# Patient Record
Sex: Male | Born: 1960 | Race: Black or African American | Hispanic: No
Health system: Midwestern US, Community
[De-identification: ages and names within clinical notes are randomized; demographics above are authoritative.]

## PROBLEM LIST (undated history)

## (undated) DIAGNOSIS — J069 Acute upper respiratory infection, unspecified: Secondary | ICD-10-CM

## (undated) DIAGNOSIS — R079 Chest pain, unspecified: Principal | ICD-10-CM

---

## 2019-10-27 ENCOUNTER — Inpatient Hospital Stay (HOSPITAL_COMMUNITY)
Admission: EM | Admit: 2019-10-27 | Discharge: 2019-10-31 | DRG: 638 | Disposition: A | Discharge status: Home / Self Care | Payer: 59 | Attending: Internal Medicine | Admitting: Internal Medicine

## 2019-10-27 ENCOUNTER — Emergency Department (HOSPITAL_COMMUNITY): Payer: 59

## 2019-10-27 ENCOUNTER — Encounter (HOSPITAL_COMMUNITY): Payer: Self-pay

## 2019-10-27 DIAGNOSIS — Y929 Unspecified place or not applicable: Secondary | ICD-10-CM

## 2019-10-27 DIAGNOSIS — G8929 Other chronic pain: Secondary | ICD-10-CM | POA: Diagnosis present

## 2019-10-27 DIAGNOSIS — Z794 Long term (current) use of insulin: Secondary | ICD-10-CM

## 2019-10-27 DIAGNOSIS — I2583 Coronary atherosclerosis due to lipid rich plaque: Secondary | ICD-10-CM | POA: Diagnosis not present

## 2019-10-27 DIAGNOSIS — K219 Gastro-esophageal reflux disease without esophagitis: Secondary | ICD-10-CM | POA: Diagnosis present

## 2019-10-27 DIAGNOSIS — F1010 Alcohol abuse, uncomplicated: Secondary | ICD-10-CM

## 2019-10-27 DIAGNOSIS — Y906 Blood alcohol level of 120-199 mg/100 ml: Secondary | ICD-10-CM | POA: Diagnosis present

## 2019-10-27 DIAGNOSIS — E1110 Type 2 diabetes mellitus with ketoacidosis without coma: Secondary | ICD-10-CM | POA: Diagnosis present

## 2019-10-27 DIAGNOSIS — I2510 Atherosclerotic heart disease of native coronary artery without angina pectoris: Secondary | ICD-10-CM | POA: Diagnosis present

## 2019-10-27 DIAGNOSIS — Z79899 Other long term (current) drug therapy: Secondary | ICD-10-CM

## 2019-10-27 DIAGNOSIS — F329 Major depressive disorder, single episode, unspecified: Secondary | ICD-10-CM | POA: Diagnosis present

## 2019-10-27 DIAGNOSIS — F419 Anxiety disorder, unspecified: Secondary | ICD-10-CM | POA: Diagnosis present

## 2019-10-27 DIAGNOSIS — Z20822 Contact with and (suspected) exposure to covid-19: Secondary | ICD-10-CM | POA: Diagnosis present

## 2019-10-27 DIAGNOSIS — T383X6A Underdosing of insulin and oral hypoglycemic [antidiabetic] drugs, initial encounter: Secondary | ICD-10-CM | POA: Diagnosis present

## 2019-10-27 DIAGNOSIS — E871 Hypo-osmolality and hyponatremia: Secondary | ICD-10-CM | POA: Diagnosis present

## 2019-10-27 DIAGNOSIS — E785 Hyperlipidemia, unspecified: Secondary | ICD-10-CM | POA: Diagnosis present

## 2019-10-27 DIAGNOSIS — F1110 Opioid abuse, uncomplicated: Secondary | ICD-10-CM | POA: Diagnosis not present

## 2019-10-27 DIAGNOSIS — I10 Essential (primary) hypertension: Secondary | ICD-10-CM | POA: Diagnosis present

## 2019-10-27 DIAGNOSIS — Z91128 Patient's intentional underdosing of medication regimen for other reason: Secondary | ICD-10-CM | POA: Diagnosis not present

## 2019-10-27 DIAGNOSIS — F1120 Opioid dependence, uncomplicated: Secondary | ICD-10-CM | POA: Diagnosis present

## 2019-10-27 DIAGNOSIS — Z7982 Long term (current) use of aspirin: Secondary | ICD-10-CM

## 2019-10-27 DIAGNOSIS — F10939 Alcohol use, unspecified with withdrawal, unspecified: Secondary | ICD-10-CM

## 2019-10-27 DIAGNOSIS — F10239 Alcohol dependence with withdrawal, unspecified: Secondary | ICD-10-CM | POA: Diagnosis present

## 2019-10-27 DIAGNOSIS — M545 Low back pain: Secondary | ICD-10-CM | POA: Diagnosis present

## 2019-10-27 DIAGNOSIS — M549 Dorsalgia, unspecified: Secondary | ICD-10-CM

## 2019-10-27 LAB — CBG MONITORING, ED
Glucose-Capillary: 458 mg/dL — ABNORMAL HIGH (ref 70–99)
Glucose-Capillary: 390 mg/dL — ABNORMAL HIGH (ref 70–99)
Glucose-Capillary: 229 mg/dL — ABNORMAL HIGH (ref 70–99)
Glucose-Capillary: 252 mg/dL — ABNORMAL HIGH (ref 70–99)

## 2019-10-27 LAB — COMPREHENSIVE METABOLIC PANEL
ALT: 49 U/L — ABNORMAL HIGH (ref 0–44)
AST: 58 U/L — ABNORMAL HIGH (ref 15–41)
Albumin: 3.4 g/dL — ABNORMAL LOW (ref 3.5–5.0)
Alkaline Phosphatase: 154 U/L — ABNORMAL HIGH (ref 38–126)
Anion gap: 17 — ABNORMAL HIGH (ref 5–15)
BUN: 7 mg/dL (ref 6–20)
CO2: 22 mmol/L (ref 22–32)
Calcium: 9.3 mg/dL (ref 8.9–10.3)
Chloride: 88 mmol/L — ABNORMAL LOW (ref 98–111)
Creatinine, Ser: 1.2 mg/dL (ref 0.61–1.24)
GFR calc Af Amer: >60 mL/min (ref >60)
GFR calc non Af Amer: >60 mL/min (ref >60)
Glucose, Bld: 620 mg/dL (ref 70–99)
Potassium: 3.9 mmol/L (ref 3.5–5.1)
Sodium: 127 mmol/L — ABNORMAL LOW (ref 135–145)
Total Bilirubin: 0.4 mg/dL (ref 0.3–1.2)
Total Protein: 7.6 g/dL (ref 6.5–8.1)

## 2019-10-27 LAB — ACETAMINOPHEN LEVEL: Acetaminophen (Tylenol), Serum: <10 ug/mL — ABNORMAL LOW (ref 10–30)

## 2019-10-27 LAB — CBC
HCT: 44.7 % (ref 39.0–52.0)
Hemoglobin: 14.3 g/dL (ref 13.0–17.0)
MCH: 27.6 pg (ref 26.0–34.0)
MCHC: 32 g/dL (ref 30.0–36.0)
MCV: 86.1 fL (ref 80.0–100.0)
Platelets: 305 10*3/uL (ref 150–400)
RBC: 5.19 MIL/uL (ref 4.22–5.81)
RDW: 13.3 % (ref 11.5–15.5)
WBC: 5.9 10*3/uL (ref 4.0–10.5)
nRBC: 0 % (ref 0.0–0.2)

## 2019-10-27 LAB — I-STAT VENOUS BLOOD GAS, ED
Acid-Base Excess: 2 mmol/L (ref 0.0–2.0)
Bicarbonate: 29 mmol/L — ABNORMAL HIGH (ref 20.0–28.0)
Calcium, Ion: 1.11 mmol/L — ABNORMAL LOW (ref 1.15–1.40)
HCT: 52 % (ref 39.0–52.0)
Hemoglobin: 17.7 g/dL — ABNORMAL HIGH (ref 13.0–17.0)
O2 Saturation: 52 %
Potassium: 4.7 mmol/L (ref 3.5–5.1)
Sodium: 128 mmol/L — ABNORMAL LOW (ref 135–145)
TCO2: 31 mmol/L (ref 22–32)
pCO2, Ven: 50.6 mmHg (ref 44.0–60.0)
pH, Ven: 7.366 (ref 7.250–7.430)
pO2, Ven: 29 mmHg — CL (ref 32.0–45.0)

## 2019-10-27 LAB — RAPID URINE DRUG SCREEN, HOSP PERFORMED
Amphetamines: NOT DETECTED
Barbiturates: NOT DETECTED
Benzodiazepines: NOT DETECTED
Cocaine: NOT DETECTED
Opiates: NOT DETECTED
Tetrahydrocannabinol: NOT DETECTED

## 2019-10-27 LAB — BETA-HYDROXYBUTYRIC ACID: Beta-Hydroxybutyric Acid: 0.08 mmol/L (ref 0.05–0.27)

## 2019-10-27 LAB — ETHANOL: Alcohol, Ethyl (B): 175 mg/dL — ABNORMAL HIGH (ref <10)

## 2019-10-27 LAB — SARS CORONAVIRUS 2 BY RT PCR (HOSPITAL ORDER, PERFORMED IN ~~LOC~~ HOSPITAL LAB): SARS Coronavirus 2: NEGATIVE

## 2019-10-27 MED ORDER — LORAZEPAM 2 MG/ML IJ SOLN
0.0000 mg | Freq: Two times a day (BID) | INTRAMUSCULAR | Status: DC
  Administered 2019-10-30 – 2019-10-31 (×3): 2 mg via INTRAVENOUS
  Filled 2019-10-27 (×3): qty 1

## 2019-10-27 MED ORDER — SODIUM CHLORIDE 0.9 % IV BOLUS
1000.0000 mL | Freq: Once | INTRAVENOUS | Status: AC
  Administered 2019-10-27: 1000 mL via INTRAVENOUS

## 2019-10-27 MED ORDER — ENOXAPARIN SODIUM 40 MG/0.4ML ~~LOC~~ SOLN: 40.0000 mg | Freq: Every day | SUBCUTANEOUS | Status: DC

## 2019-10-27 MED ORDER — LIDOCAINE 5 % EX PTCH
1.0000 | MEDICATED_PATCH | Freq: Every day | CUTANEOUS | Status: DC
  Administered 2019-10-27: 1 via TRANSDERMAL
  Filled 2019-10-27: qty 1

## 2019-10-27 MED ORDER — LACTATED RINGERS IV SOLN: INTRAVENOUS | Status: DC

## 2019-10-27 MED ORDER — LORAZEPAM 1 MG PO TABS
0.0000 mg | ORAL_TABLET | Freq: Four times a day (QID) | ORAL | Status: AC
  Administered 2019-10-27 – 2019-10-28 (×4): 1 mg via ORAL
  Filled 2019-10-27 (×5): qty 1

## 2019-10-27 MED ORDER — ACETAMINOPHEN 325 MG PO TABS
650.0000 mg | ORAL_TABLET | Freq: Four times a day (QID) | ORAL | Status: DC | PRN
  Filled 2019-10-27: qty 2

## 2019-10-27 MED ORDER — KETOROLAC TROMETHAMINE 15 MG/ML IJ SOLN
15.0000 mg | Freq: Once | INTRAMUSCULAR | Status: AC
  Administered 2019-10-27: 15 mg via INTRAVENOUS
  Filled 2019-10-27: qty 1

## 2019-10-27 MED ORDER — DEXTROSE IN LACTATED RINGERS 5 % IV SOLN: INTRAVENOUS | Status: DC

## 2019-10-27 MED ORDER — THIAMINE HCL 100 MG/ML IJ SOLN: 100.0000 mg | Freq: Every day | INTRAMUSCULAR | Status: DC

## 2019-10-27 MED ORDER — LORAZEPAM 1 MG PO TABS: 0.0000 mg | ORAL_TABLET | Freq: Two times a day (BID) | ORAL | Status: DC

## 2019-10-27 MED ORDER — DEXTROSE 50 % IV SOLN: 0.0000 mL | INTRAVENOUS | Status: DC | PRN

## 2019-10-27 MED ORDER — ENOXAPARIN SODIUM 40 MG/0.4ML ~~LOC~~ SOLN
40.0000 mg | Freq: Every day | SUBCUTANEOUS | Status: DC
  Administered 2019-10-28 – 2019-10-31 (×4): 40 mg via SUBCUTANEOUS
  Filled 2019-10-27 (×3): qty 0.4

## 2019-10-27 MED ORDER — THIAMINE HCL 100 MG PO TABS
100.0000 mg | ORAL_TABLET | Freq: Every day | ORAL | Status: DC
  Administered 2019-10-27 – 2019-10-31 (×5): 100 mg via ORAL
  Filled 2019-10-27 (×5): qty 1

## 2019-10-27 MED ORDER — INSULIN REGULAR(HUMAN) IN NACL 100-0.9 UT/100ML-% IV SOLN
INTRAVENOUS | Status: DC
  Administered 2019-10-27: 15 [IU]/h via INTRAVENOUS
  Filled 2019-10-27: qty 100

## 2019-10-27 MED ORDER — LORAZEPAM 2 MG/ML IJ SOLN
0.0000 mg | Freq: Four times a day (QID) | INTRAMUSCULAR | Status: AC
  Administered 2019-10-29 (×3): 1 mg via INTRAVENOUS
  Filled 2019-10-27 (×4): qty 1

## 2019-10-27 MED ORDER — POTASSIUM CHLORIDE 10 MEQ/100ML IV SOLN
10.0000 meq | INTRAVENOUS | Status: AC
  Administered 2019-10-27 (×2): 10 meq via INTRAVENOUS
  Filled 2019-10-27: qty 100

## 2019-10-27 NOTE — H&P (Signed)
History and Physical    David Mcbride DJM:[id number] DOB: 12-14-1960 DOA: 10/27/2019  PCP: Patient, No Pcp Per  Patient coming from: Home  I have personally briefly reviewed patient's old medical records in [facility]  Chief Complaint: Wants to detox  HPI: David Mcbride is a 59 y.o. male with medical history significant for alcohol and opioid abuse, insulin-dependent type 2 diabetes, hypertension, CAD, GERD and anxiety and depression who presents with concerns of wanting to detox.  Patient is coming in today because he feels like he has an addiction problem and wants to detox.  States 3 years ago he had issues with lower back pain and since then has been taking more more opioids.  He has been taking 6-7 hydrocodone pills for the past 2 years with the last dose being this morning.  Also reports many years of alcohol use and has increased in the past 2 weeks up to 18-20 beers daily.  Denies any suicidal ideations but knows eventually he might hurt himself so that is why he brought himself in here today.  He also stopped taking all of his home meds and insulin for the past 2 weeks since he just did not feel like it.  He resumed them last night. Right now he just reports feeling shaky.  Denies any nausea, vomiting or diarrhea.  No chest pain, shortness of breath.  ED Course: He had temperature of 99, tachycardic and hypertensive up to 170s.  Lab work notable for hyponatremia of 127, glucose of 620, anion gap 17, pH of 7.3, bicarb of 29.  CBC unremarkable.  Alcohol level elevated to 175.  UDS negative.  Patient was placed on CIWA protocol and insulin drip and hospitalist was called for admission.  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  He is married.  He denies tobacco use.  Has had increased opioid use and 18-20 beers daily.   Prior to Admission medications   Medication Sig Start Date End Date Taking? Authorizing Provider  amLODipine (NORVASC) 10 MG tablet Take 10 mg by mouth daily. 08/03/19   [provider]  aspirin 81 MG EC tablet Take 81 mg by mouth daily.    [provider]  gabapentin (NEURONTIN) 300 MG capsule Take 300 mg by mouth daily. 10/09/19   [provider]  HYDROcodone-acetaminophen (NORCO) 10-325 MG tablet Take 1 tablet by mouth 3 (three) times daily as needed for pain. 10/03/19   [provider]  sertraline (ZOLOFT) 50 MG tablet Take 50 mg by mouth daily. 10/14/19   [provider]    Physical Exam: Vitals:   10/27/19 2130 10/27/19 2145 10/27/19 2200 10/27/19 2215  BP: (!) 158/119 (!) 167/115 (!) 178/123 (!) 173/118  Pulse: 99 (!) 106 99 (!) 102  Resp: 15 20 14 13   Temp:      TempSrc:      SpO2: 96% 100% 96% 96%  Weight:      Height:        Constitutional:  NAD, calm, comfortable, nontoxic appearing male laying flat in bed  Vitals:   10/27/19 2130 10/27/19 2145 10/27/19 2200 10/27/19 2215  BP: (!) 158/119 (!) 167/115 (!) 178/123 (!) 173/118  Pulse: 99 (!) 106 99 (!) 102  Resp: 15 20 14 13   Temp:      TempSrc:      SpO2: 96% 100% 96% 96%  Weight:      Height:       Eyes: PERRL, lids and conjunctivae normal ENMT: Mucous membranes are moist.  Neck: normal, supple  Respiratory: clear to auscultation bilaterally, no wheezing, no crackles. Normal respiratory effort. No accessory muscle use.  Cardiovascular: Regular rate  and rhythm, no murmurs / rubs / gallops. No extremity edema. Abdomen: no tenderness, no masses palpated.Bowel sounds positive.  Musculoskeletal: no clubbing / cyanosis. No joint deformity upper and lower extremities. Good ROM, no contractures. Normal muscle tone.  Skin: no rashes, lesions, ulcers. No induration Neurologic: CN 2-12 grossly intact. Sensation intact,  Strength 5/5 in all 4.  Psychiatric: Normal judgment and insight. Alert and oriented x 3. Normal mood.    Labs on Admission: I have personally reviewed following labs and imaging studies  CBC: Recent Labs  Lab 10/27/19 1404 10/27/19 2013  WBC 5.9  --   HGB 14.3 17.7*  HCT 44.7 52.0  MCV 86.1  --   PLT 305  --    Basic Metabolic Panel: Recent Labs  Lab 10/27/19 1404 10/27/19 2013  NA 127* 128*  K 3.9 4.7  CL 88*  --   CO2 22  --   GLUCOSE 620*  --   BUN 7  --   CREATININE 1.20  --   CALCIUM 9.3  --    GFR: Estimated Creatinine Clearance: 73.4 mL/min (by C-G formula based on SCr of 1.2 mg/dL). Liver Function Tests: Recent Labs  Lab 10/27/19 1404  AST 58*  ALT 49*  ALKPHOS 154*  BILITOT 0.4  PROT 7.6  ALBUMIN 3.4*   No results for input(s): LIPASE, AMYLASE in the last 168 hours. No results for input(s): AMMONIA in the last 168 hours. Coagulation Profile: No results for input(s): INR, PROTIME in the last 168 hours. Cardiac Enzymes: No results for input(s): CKTOTAL, CKMB, CKMBINDEX, TROPONINI in the last 168 hours. BNP (last 3 results) No results for input(s): PROBNP in the last 8760 hours. HbA1C: No results for input(s): HGBA1C in the last 72 hours. CBG: Recent Labs  Lab 10/27/19 2118 10/27/19 2206  GLUCAP 458* 390*   Lipid Profile: No results for input(s): CHOL, HDL, LDLCALC, TRIG, CHOLHDL, LDLDIRECT in the last 72 hours. Thyroid Function Tests: No results for input(s): TSH, T4TOTAL, FREET4, T3FREE, THYROIDAB in the last 72 hours. Anemia Panel: No results for input(s): VITAMINB12, FOLATE,  FERRITIN, TIBC, IRON, RETICCTPCT in the last 72 hours. Urine analysis: No results found for: COLORURINE, APPEARANCEUR, LABSPEC, PHURINE, GLUCOSEU, HGBUR, BILIRUBINUR, KETONESUR, PROTEINUR, UROBILINOGEN, NITRITE, LEUKOCYTESUR  Radiological Exams on Admission: DG Chest 2 View  Result Date: 10/27/2019 CLINICAL DATA:  Chest pain EXAM: CHEST - 2 VIEW COMPARISON:  None. FINDINGS: The heart size and mediastinal contours are within normal limits. Both lungs are clear. The visualized skeletal structures are unremarkable. IMPRESSION: No active cardiopulmonary disease. Electronically Signed   By: 10/29/2019 M.D.   On: 10/27/2019 20:52      Assessment/Plan  Mild DKA pH of 7.3, bicarb 29, BG of 620, anion gap 17 replete potassium continue insulin gtt with goal of 140-180 and AG <12 IV  NS until BG <250, then switch to D5 1/2 NS  BMP q4hr  keep NPO  Alcohol abuse place on CIWA protocol Endorsed 18-20 beers daily.  Last had 3 beers this morning on 8/31. Transition of care team consulted to help with inpatient detox resources  Opioid abuse for chronic back pain Endorsed use of 6-7 hydrocodone's daily for the past several years States back pain about 10/10 now give Torodol x1  Lidocaine patch  will cut Norco frequency by half to avoid withdrawal  TOCM consulted   Pseudohyponatremia osmotic changes due to DKA Normal after glucose correction   Hypertension  Continue amlodipine  CAD Continue daily aspirin  DVT prophylaxis:.Lovenox Code Status: Full Family Communication: Plan discussed with patient at bedside  disposition Plan: Home with at least 2 midnight stays  Consults called:  Admission status: inpatient  Status is: Inpatient  Remains inpatient appropriate because:Inpatient level of care appropriate due to severity of illness   Dispo: The patient is from: Home              Anticipated d/c is to: Home              Anticipated d/c date is: 3 days              Patient  currently is not medically stable to d/c.         Anselm Jungling DO [facility]   If 7PM-7AM, please contact night-coverage [email or web address]   10/27/2019, 10:32 PM

## 2019-10-27 NOTE — ED Notes (Signed)
Dr Tegeler aware Glucose 620

## 2019-10-27 NOTE — ED Triage Notes (Signed)
Pt requesting detox from alcohol and pain pills. Last does earlier today. Pt a.o, nad noted

## 2019-10-27 NOTE — ED Notes (Signed)
Wife David Mcbride): [phone] for Contact Information

## 2019-10-27 NOTE — ED Provider Notes (Signed)
[place] EMERGENCY DEPARTMENT Provider Note   CSN: [id number] Arrival date & time: 10/27/19  1304     History Chief Complaint  Patient presents with  . Alcohol Problem  . Drug Problem    David Mcbride is a 59 y.o. male with a past medical history of alcohol abuse, IDDM, hypertension, hyperlipidemia presenting to the ED requesting detox from alcohol abuse and opiate abuse.   States that he drinks anywhere from 10-15 beers daily for the past few years.  His last drink was approximately 12 hours ago.  He has noticed some tremors.  Denies history of alcohol withdrawal seizures or DTs in the past.  Also has been prescribed Norco 10-325 for chronic pain in his lower back.  Over the past several months he has been taking anywhere from 6 to 7 pills of this Norco daily.  His last pill was this morning prior to him running out.  He is requesting detox.  He does admit to some intermittent central chest pain rating to his back for the past 1.5 weeks.  No pain currently.  Denies any abdominal pain, vomiting, diarrhea, shortness of breath, fever.  HPI     History reviewed. No pertinent past medical history.  There are no problems to display for this patient.   History reviewed. No pertinent surgical history.     No family history on file.  Social History   Tobacco Use  . Smoking status: Not on file  Substance Use Topics  . Alcohol use: Not on file  . Drug use: Not on file    Home Medications Prior to Admission medications   Not on File    Allergies    Patient has no allergy information on record.  Review of Systems   Review of Systems  Constitutional: Negative for appetite change, chills and fever.  HENT: Negative for ear pain, rhinorrhea, sneezing and sore throat.   Eyes: Negative for photophobia and visual disturbance.  Respiratory: Negative for cough, chest tightness, shortness of breath and wheezing.   Cardiovascular: Positive for chest pain.  Negative for palpitations.  Gastrointestinal: Negative for abdominal pain, blood in stool, constipation, diarrhea, nausea and vomiting.  Genitourinary: Negative for dysuria, hematuria and urgency.  Musculoskeletal: Positive for back pain. Negative for myalgias.  Skin: Negative for rash.  Neurological: Positive for tremors. Negative for dizziness, weakness and light-headedness.    Physical Exam Updated Vital Signs BP (!) 170/125 (BP Location: Right Arm)   Pulse (!) 105   Temp 99 F (37.2 C) (Oral)   Resp 18   Ht '5\' 9"'  (1.753 m)   Wt 87.1 kg   SpO2 99%   BMI 28.35 kg/m   Physical Exam Vitals and nursing note reviewed.  Constitutional:      General: He is not in acute distress.    Appearance: He is well-developed. He is diaphoretic.  HENT:     Head: Normocephalic and atraumatic.     Nose: Nose normal.  Eyes:     General: No scleral icterus.       Right eye: No discharge.        Left eye: No discharge.     Conjunctiva/sclera: Conjunctivae normal.  Cardiovascular:     Rate and Rhythm: Regular rhythm. Tachycardia present.     Heart sounds: Normal heart sounds. No murmur heard.  No friction rub. No gallop.   Pulmonary:     Effort: Pulmonary effort is normal. No respiratory distress.     Breath sounds:  Normal breath sounds.  Abdominal:     General: Bowel sounds are normal. There is no distension.     Palpations: Abdomen is soft.     Tenderness: There is no abdominal tenderness. There is no guarding.  Musculoskeletal:        General: Normal range of motion.     Cervical back: Normal range of motion and neck supple.  Skin:    General: Skin is warm.     Findings: No rash.  Neurological:     Mental Status: He is alert.     Motor: No abnormal muscle tone.     Coordination: Coordination normal.  Psychiatric:        Mood and Affect: Mood is anxious.     ED Results / Procedures / Treatments   Labs (all labs ordered are listed, but only abnormal results are  displayed) Labs Reviewed  COMPREHENSIVE METABOLIC PANEL - Abnormal; Notable for the following components:      Result Value   Sodium 127 (*)    Chloride 88 (*)    Glucose, Bld 620 (*)    Albumin 3.4 (*)    AST 58 (*)    ALT 49 (*)    Alkaline Phosphatase 154 (*)    Anion gap 17 (*)    All other components within normal limits  ETHANOL - Abnormal; Notable for the following components:   Alcohol, Ethyl (B) 175 (*)    All other components within normal limits  ACETAMINOPHEN LEVEL - Abnormal; Notable for the following components:   Acetaminophen (Tylenol), Serum <10 (*)    All other components within normal limits  I-STAT VENOUS BLOOD GAS, ED - Abnormal; Notable for the following components:   pO2, Ven 29.0 (*)    Bicarbonate 29.0 (*)    Sodium 128 (*)    Calcium, Ion 1.11 (*)    Hemoglobin 17.7 (*)    All other components within normal limits  CBC  RAPID URINE DRUG SCREEN, HOSP PERFORMED  BLOOD GAS, VENOUS  BETA-HYDROXYBUTYRIC ACID  CBG MONITORING, ED    EKG None  Radiology DG Chest 2 View  Result Date: 10/27/2019 CLINICAL DATA:  Chest pain EXAM: CHEST - 2 VIEW COMPARISON:  None. FINDINGS: The heart size and mediastinal contours are within normal limits. Both lungs are clear. The visualized skeletal structures are unremarkable. IMPRESSION: No active cardiopulmonary disease. Electronically Signed   By: Prudencio Pair M.D.   On: 10/27/2019 20:52    Procedures .Critical Care Performed by: Delia Heady, PA-C Authorized by: Delia Heady, PA-C   Critical care provider statement:    Critical care time (minutes):  35   Critical care was necessary to treat or prevent imminent or life-threatening deterioration of the following conditions:  Circulatory failure, respiratory failure, renal failure and endocrine crisis   Critical care was time spent personally by me on the following activities:  Development of treatment plan with patient or surrogate, evaluation of patient's response  to treatment, examination of patient, obtaining history from patient or surrogate, ordering and performing treatments and interventions, ordering and review of laboratory studies, ordering and review of radiographic studies, pulse oximetry, re-evaluation of patient's condition, review of old charts and discussions with consultants   I assumed direction of critical care for this patient from another provider in my specialty: no     (including critical care time)  Medications Ordered in ED Medications  LORazepam (ATIVAN) injection 0-4 mg ( Intravenous See Alternative 10/27/19 2018)    Or  LORazepam (ATIVAN) tablet 0-4 mg (1 mg Oral Given 10/27/19 2018)  LORazepam (ATIVAN) injection 0-4 mg (has no administration in time range)    Or  LORazepam (ATIVAN) tablet 0-4 mg (has no administration in time range)  thiamine tablet 100 mg (100 mg Oral Given 10/27/19 2018)    Or  thiamine (B-1) injection 100 mg ( Intravenous See Alternative 10/27/19 2018)  insulin regular, human (MYXREDLIN) 100 units/ 100 mL infusion (15 Units/hr Intravenous New Bag/Given 10/27/19 2124)  lactated ringers infusion ( Intravenous New Bag/Given 10/27/19 2119)  dextrose 5 % in lactated ringers infusion (has no administration in time range)  dextrose 50 % solution 0-50 mL (has no administration in time range)  potassium chloride 10 mEq in 100 mL IVPB (10 mEq Intravenous New Bag/Given 10/27/19 2120)  sodium chloride 0.9 % bolus 1,000 mL (1,000 mLs Intravenous New Bag/Given 10/27/19 2018)    ED Course  I have reviewed the triage vital signs and the nursing notes.  Pertinent labs & imaging results that were available during my care of the patient were reviewed by me and considered in my medical decision making (see chart for details).  Clinical Course as of Oct 27 2134  Tue Oct 27, 2019  1951 Glucose(!!): 620 [HK]  1951 Alkaline Phosphatase(!): 154 [HK]  1951 ALT(!): 49 [HK]  1951 AST(!): 58 [HK]  1951 Anion gap(!): 17 [HK]     Clinical Course User Index [HK] Delia Heady, PA-C   MDM Rules/Calculators/A&P                          59 year old male with past medical history of alcohol abuse, IDDM, hypertension, hyperlipidemia presenting to the ED requesting detox from alcohol and opiates.  Drinks anywhere from 10-15 beers daily for the past few years, concerned that he may be withdrawing as he reports tremors, fatigue.  Last drink was approximately 12 hours ago.  He has been prescribed Norco 10-3 25 for chronic pain and has been taking 6 to 7 pills of Norco daily.  He ran out this morning.  He is requesting detox from both of these.  He denies any abdominal pain, no history of alcohol withdrawal seizures.  He denies any hallucinations.  On exam patient is tachycardic, diaphoretic.  He does note that his blood sugars have been running high as he has been noncompliant with his insulin.  Glucose of 620 here with an anion gap of 17.  Chloride of 88.  AST, ALT and alk phos are elevated which could be secondary to his ongoing alcohol use versus Tylenol abuse.  Tylenol level is 0.  Chest x-ray without any acute findings.  Patient started on insulin drip. CIWA score of 5.  Given 24m of Ativan. Discussed patient's symptoms at this time and he feels he will benefit from admission as he reports his symptoms have worsened since he has been waiting in the waiting room, also need to be monitored for alcohol withdrawal symptoms.    Portions of this note were generated with DLobbyist Dictation errors may occur despite best attempts at proofreading.  Final Clinical Impression(s) / ED Diagnoses Final diagnoses:  Alcohol withdrawal syndrome with complication ([facility]  Diabetic ketoacidosis without coma associated with type 2 diabetes mellitus ([facility]    Rx / DC Orders ED Discharge Orders    None       KDelia Heady PA-C 10/27/19 2136    Tegeler, CGwenyth Allegra MD 10/28/19 1145

## 2019-10-28 LAB — GLUCOSE, CAPILLARY
Glucose-Capillary: 351 mg/dL — ABNORMAL HIGH (ref 70–99)
Glucose-Capillary: 471 mg/dL — ABNORMAL HIGH (ref 70–99)
Glucose-Capillary: 366 mg/dL — ABNORMAL HIGH (ref 70–99)

## 2019-10-28 LAB — BASIC METABOLIC PANEL
Anion gap: 12 (ref 5–15)
Anion gap: 13 (ref 5–15)
Anion gap: 13 (ref 5–15)
BUN: 6 mg/dL (ref 6–20)
BUN: 7 mg/dL (ref 6–20)
BUN: 13 mg/dL (ref 6–20)
CO2: 26 mmol/L (ref 22–32)
CO2: 26 mmol/L (ref 22–32)
CO2: 21 mmol/L — ABNORMAL LOW (ref 22–32)
Calcium: 8.9 mg/dL (ref 8.9–10.3)
Calcium: 8.9 mg/dL (ref 8.9–10.3)
Calcium: 8.7 mg/dL — ABNORMAL LOW (ref 8.9–10.3)
Chloride: 96 mmol/L — ABNORMAL LOW (ref 98–111)
Chloride: 96 mmol/L — ABNORMAL LOW (ref 98–111)
Chloride: 96 mmol/L — ABNORMAL LOW (ref 98–111)
Creatinine, Ser: 1 mg/dL (ref 0.61–1.24)
Creatinine, Ser: 1.06 mg/dL (ref 0.61–1.24)
Creatinine, Ser: 1.15 mg/dL (ref 0.61–1.24)
GFR calc Af Amer: >60 mL/min (ref >60)
GFR calc Af Amer: >60 mL/min (ref >60)
GFR calc Af Amer: >60 mL/min (ref >60)
GFR calc non Af Amer: >60 mL/min (ref >60)
GFR calc non Af Amer: >60 mL/min (ref >60)
GFR calc non Af Amer: >60 mL/min (ref >60)
Glucose, Bld: 193 mg/dL — ABNORMAL HIGH (ref 70–99)
Glucose, Bld: 193 mg/dL — ABNORMAL HIGH (ref 70–99)
Glucose, Bld: 424 mg/dL — ABNORMAL HIGH (ref 70–99)
Potassium: 3.9 mmol/L (ref 3.5–5.1)
Potassium: 3.7 mmol/L (ref 3.5–5.1)
Potassium: 4.9 mmol/L (ref 3.5–5.1)
Sodium: 134 mmol/L — ABNORMAL LOW (ref 135–145)
Sodium: 135 mmol/L (ref 135–145)
Sodium: 130 mmol/L — ABNORMAL LOW (ref 135–145)

## 2019-10-28 LAB — CBC
HCT: 42.8 % (ref 39.0–52.0)
Hemoglobin: 13.9 g/dL (ref 13.0–17.0)
MCH: 28.2 pg (ref 26.0–34.0)
MCHC: 32.5 g/dL (ref 30.0–36.0)
MCV: 86.8 fL (ref 80.0–100.0)
Platelets: 273 10*3/uL (ref 150–400)
RBC: 4.93 MIL/uL (ref 4.22–5.81)
RDW: 13.3 % (ref 11.5–15.5)
WBC: 5.3 10*3/uL (ref 4.0–10.5)
nRBC: 0 % (ref 0.0–0.2)

## 2019-10-28 LAB — BETA-HYDROXYBUTYRIC ACID
Beta-Hydroxybutyric Acid: 0.06 mmol/L (ref 0.05–0.27)
Beta-Hydroxybutyric Acid: 0.07 mmol/L (ref 0.05–0.27)
Beta-Hydroxybutyric Acid: 0.17 mmol/L (ref 0.05–0.27)

## 2019-10-28 LAB — HEMOGLOBIN A1C
Hgb A1c MFr Bld: 14.8 % — ABNORMAL HIGH (ref 4.8–5.6)
Mean Plasma Glucose: 378.06 mg/dL

## 2019-10-28 LAB — CBG MONITORING, ED
Glucose-Capillary: 165 mg/dL — ABNORMAL HIGH (ref 70–99)
Glucose-Capillary: 197 mg/dL — ABNORMAL HIGH (ref 70–99)
Glucose-Capillary: 212 mg/dL — ABNORMAL HIGH (ref 70–99)
Glucose-Capillary: 272 mg/dL — ABNORMAL HIGH (ref 70–99)
Glucose-Capillary: 309 mg/dL — ABNORMAL HIGH (ref 70–99)

## 2019-10-28 LAB — HIV ANTIBODY (ROUTINE TESTING W REFLEX): HIV Screen 4th Generation wRfx: NONREACTIVE

## 2019-10-28 MED ORDER — INSULIN ASPART 100 UNIT/ML ~~LOC~~ SOLN: 3.0000 [IU] | Freq: Three times a day (TID) | SUBCUTANEOUS | Status: DC

## 2019-10-28 MED ORDER — INSULIN ASPART 100 UNIT/ML ~~LOC~~ SOLN
0.0000 [IU] | Freq: Every day | SUBCUTANEOUS | Status: DC
  Administered 2019-10-28: 5 [IU] via SUBCUTANEOUS
  Administered 2019-10-29: 3 [IU] via SUBCUTANEOUS
  Administered 2019-10-30: 4 [IU] via SUBCUTANEOUS

## 2019-10-28 MED ORDER — LIDOCAINE 5 % EX PTCH
2.0000 | MEDICATED_PATCH | Freq: Every day | CUTANEOUS | Status: DC
  Administered 2019-10-28 – 2019-10-30 (×3): 2 via TRANSDERMAL
  Filled 2019-10-28 (×3): qty 2

## 2019-10-28 MED ORDER — INSULIN GLARGINE 100 UNIT/ML ~~LOC~~ SOLN
10.0000 [IU] | Freq: Once | SUBCUTANEOUS | Status: AC
  Administered 2019-10-28: 10 [IU] via SUBCUTANEOUS
  Filled 2019-10-28: qty 0.1

## 2019-10-28 MED ORDER — INSULIN GLARGINE 100 UNIT/ML ~~LOC~~ SOLN
10.0000 [IU] | Freq: Every day | SUBCUTANEOUS | Status: DC
  Filled 2019-10-28: qty 0.1

## 2019-10-28 MED ORDER — AMLODIPINE BESYLATE 10 MG PO TABS
10.0000 mg | ORAL_TABLET | Freq: Every day | ORAL | Status: DC
  Administered 2019-10-28 – 2019-10-31 (×4): 10 mg via ORAL
  Filled 2019-10-28 (×3): qty 1
  Filled 2019-10-28: qty 2

## 2019-10-28 MED ORDER — INSULIN GLARGINE 100 UNIT/ML ~~LOC~~ SOLN
10.0000 [IU] | Freq: Two times a day (BID) | SUBCUTANEOUS | Status: DC
  Administered 2019-10-28 – 2019-10-29 (×3): 10 [IU] via SUBCUTANEOUS
  Filled 2019-10-28 (×5): qty 0.1

## 2019-10-28 MED ORDER — HYDROCODONE-ACETAMINOPHEN 10-325 MG PO TABS
1.0000 | ORAL_TABLET | Freq: Three times a day (TID) | ORAL | Status: DC | PRN
  Administered 2019-10-28 – 2019-10-30 (×5): 1 via ORAL
  Filled 2019-10-28 (×5): qty 1

## 2019-10-28 MED ORDER — INSULIN GLARGINE 100 UNIT/ML ~~LOC~~ SOLN: 15.0000 [IU] | Freq: Every day | SUBCUTANEOUS | Status: DC

## 2019-10-28 MED ORDER — INSULIN ASPART 100 UNIT/ML ~~LOC~~ SOLN
4.0000 [IU] | Freq: Three times a day (TID) | SUBCUTANEOUS | Status: DC
  Administered 2019-10-28 – 2019-10-30 (×5): 4 [IU] via SUBCUTANEOUS

## 2019-10-28 MED ORDER — ASPIRIN 81 MG PO CHEW
81.0000 mg | CHEWABLE_TABLET | Freq: Every day | ORAL | Status: DC
  Administered 2019-10-28 – 2019-10-31 (×4): 81 mg via ORAL
  Filled 2019-10-28 (×4): qty 1

## 2019-10-28 MED ORDER — INSULIN ASPART 100 UNIT/ML ~~LOC~~ SOLN
0.0000 [IU] | Freq: Three times a day (TID) | SUBCUTANEOUS | Status: DC
  Administered 2019-10-28: 5 [IU] via SUBCUTANEOUS
  Administered 2019-10-28: 9 [IU] via SUBCUTANEOUS
  Administered 2019-10-28: 7 [IU] via SUBCUTANEOUS
  Administered 2019-10-29: 5 [IU] via SUBCUTANEOUS

## 2019-10-28 MED ORDER — HYDROCODONE-ACETAMINOPHEN 10-325 MG PO TABS
1.0000 | ORAL_TABLET | Freq: Three times a day (TID) | ORAL | Status: DC
  Administered 2019-10-28 (×2): 1 via ORAL
  Filled 2019-10-28 (×2): qty 1

## 2019-10-28 NOTE — ED Notes (Signed)
Attempted to call report, nurse unavailable at this time.

## 2019-10-28 NOTE — Progress Notes (Signed)
Inpatient Diabetes Program Recommendations  AACE/ADA: New Consensus Statement on Inpatient Glycemic Control (2015)  Target Ranges:  Prepandial:   less than 140 mg/dL      Peak postprandial:   less than 180 mg/dL (1-2 hours)      Critically ill patients:  140 - 180 mg/dL   Lab Results  Component Value Date   GLUCAP 272 (H) 10/28/2019   HGBA1C 14.8 (H) 10/28/2019    Review of Glycemic Control Results for David Mcbride, David Mcbride (MRN [id number]) as of 10/28/2019 10:50  Ref. Range 10/27/2019 23:11 10/28/2019 00:19 10/28/2019 01:17 10/28/2019 05:25 10/28/2019 08:47  Glucose-Capillary Latest Ref Range: 70 - 99 mg/dL 865 (H) 784 (H) 696 (H) 197 (H) 272 (H)   Diabetes history: DM 2 Outpatient Diabetes medications:  Metformin 500 mg bid, Glucotrol 10 mg bid, Januvia 50 mg daily, Lantus 30 units q HS Current orders for Inpatient glycemic control:  Novolog sensitive tid with meals and HS Lantus 10 units daily  Inpatient Diabetes Program Recommendations:    May consider increasing Lantus to 10 units bid.   Also may need Novolog meal coverage 4 units tid with meals.  Patient here for detox symptoms.  Will need to resume home meds when appropriate.  May need to d/c Metformin due to hx. of ETOH abuse.  Will attempt to talk to patient when appropriate regarding A1C of 14.8%.  Thanks,  Beryl Meager, RN, BC-ADM Inpatient Diabetes Coordinator Pager [phone] (8a-5p)

## 2019-10-28 NOTE — Progress Notes (Signed)
PROGRESS NOTE  David Mcbride XIP:[id number] DOB: 1960/06/22 DOA: 10/27/2019 PCP: Patient, No Pcp Per  HPI/Recap of past 24 hours: HPI from Dr Delman Cheadle is a 59 y.o. male with medical history significant for alcohol and opioid abuse, insulin-dependent type 2 diabetes, hypertension, CAD, GERD and anxiety and depression who presents with concerns of wanting to detox. States 3 years ago he had issues with lower back pain and since then has been taking more more opioids.  He has been taking 6-7 hydrocodone pills for the past 2 years with the last dose being this morning PTA.  Also reports many years of alcohol use and has increased in the past 2 weeks up to 18-20 beers daily.  Denies any suicidal ideations but knows eventually he might hurt himself so that is why he brought himself in here today.  He also stopped taking all of his home meds and insulin for the past 2 weeks since he just did not feel like it. ED Course: He had temperature of 99, tachycardic and hypertensive up to 170s.  Lab work notable for hyponatremia of 127, glucose of 620, anion gap 17, pH of 7.3, bicarb of 29.  CBC unremarkable.  Alcohol level elevated to 175.  UDS negative. Patient was placed on CIWA protocol and insulin drip and hospitalist was called for admission.    Today, patient still reports significant lower back pain, nausea, but denies vomiting, shortness of breath, chest pain, abdominal pain, fever/chills.   Assessment/Plan: Principal Problem:   DKA (diabetic ketoacidoses) (HCC) Active Problems:   Alcohol abuse   Opioid abuse (HCC)   HTN (hypertension)   CAD (coronary artery disease)   Mild DKA On admission presented with pH 7.3, bicarb 29, anion gap 17, BG of 620 A1c 14.8 S/p insulin drip--> improved Start SSI, Lantus twice daily, NovoLog 3 times daily, Accu-Cheks, hypoglycemic protocol Monitor closely  Alcohol abuse with possible withdrawal 18-20 beers daily, last had 3 beers this morning on  8/31 CIWA protocol Consulted to help with detox  Opioid dependence for chronic back pain 6-7 hydrocodone once daily for the past several years Lidocaine patch, Norco as needed TOC consulted  Hypertension Continue amlodipine  CAD Continue daily aspirin         Malnutrition Type:      Malnutrition Characteristics:      Nutrition Interventions:       Estimated body mass index is 28.35 kg/m as calculated from the following:   Height as of this encounter: 5\' 9"  (1.753 m).   Weight as of this encounter: 87.1 kg.     Code Status: Full  Family Communication: Discussed with patient  Disposition Plan: Status is: Inpatient  Remains inpatient appropriate because:Inpatient level of care appropriate due to severity of illness   Dispo: The patient is from: Home              Anticipated d/c is to: Home              Anticipated d/c date is: 2 days              Patient currently is not medically stable to d/c.    Consultants:  None  Procedures:  None  Antimicrobials:  None  DVT prophylaxis: Lovenox   Objective: Vitals:   10/28/19 0600 10/28/19 0905 10/28/19 1149 10/28/19 1409  BP: (!) 136/102 (!) 145/112 (!) 154/114 (!) 178/106  Pulse: 93 (!) 107 (!) 102 99  Resp: 12 16 16 12   Temp:  TempSrc:      SpO2: 96% 96% 99% 97%  Weight:      Height:        Intake/Output Summary (Last 24 hours) at 10/28/2019 1456 Last data filed at 10/28/2019 0743 Gross per 24 hour  Intake 1333.02 ml  Output --  Net 1333.02 ml   Filed Weights   10/27/19 1333  Weight: 87.1 kg    Exam:  General: NAD   Cardiovascular: S1, S2 present  Respiratory: CTAB  Abdomen: Soft, nontender, nondistended, bowel sounds present  Musculoskeletal: No bilateral pedal edema noted  Skin: Normal  Psychiatry: Normal mood    Data Reviewed: CBC: Recent Labs  Lab 10/27/19 1404 10/27/19 2013 10/28/19 0527  WBC 5.9  --  5.3  HGB 14.3 17.7* 13.9  HCT 44.7 52.0 42.8   MCV 86.1  --  86.8  PLT 305  --  273   Basic Metabolic Panel: Recent Labs  Lab 10/27/19 1404 10/27/19 2013 10/28/19 0047 10/28/19 0527  NA 127* 128* 134* 135  K 3.9 4.7 3.9 3.7  CL 88*  --  96* 96*  CO2 22  --  26 26  GLUCOSE 620*  --  193* 193*  BUN 7  --  6 7  CREATININE 1.20  --  1.00 1.06  CALCIUM 9.3  --  8.9 8.9   GFR: Estimated Creatinine Clearance: 83.1 mL/min (by C-G formula based on SCr of 1.06 mg/dL). Liver Function Tests: Recent Labs  Lab 10/27/19 1404  AST 58*  ALT 49*  ALKPHOS 154*  BILITOT 0.4  PROT 7.6  ALBUMIN 3.4*   No results for input(s): LIPASE, AMYLASE in the last 168 hours. No results for input(s): AMMONIA in the last 168 hours. Coagulation Profile: No results for input(s): INR, PROTIME in the last 168 hours. Cardiac Enzymes: No results for input(s): CKTOTAL, CKMB, CKMBINDEX, TROPONINI in the last 168 hours. BNP (last 3 results) No results for input(s): PROBNP in the last 8760 hours. HbA1C: Recent Labs    10/28/19 0047  HGBA1C 14.8*   CBG: Recent Labs  Lab 10/28/19 0019 10/28/19 0117 10/28/19 0525 10/28/19 0847 10/28/19 1148  GLUCAP 212* 165* 197* 272* 309*   Lipid Profile: No results for input(s): CHOL, HDL, LDLCALC, TRIG, CHOLHDL, LDLDIRECT in the last 72 hours. Thyroid Function Tests: No results for input(s): TSH, T4TOTAL, FREET4, T3FREE, THYROIDAB in the last 72 hours. Anemia Panel: No results for input(s): VITAMINB12, FOLATE, FERRITIN, TIBC, IRON, RETICCTPCT in the last 72 hours. Urine analysis: No results found for: COLORURINE, APPEARANCEUR, LABSPEC, PHURINE, GLUCOSEU, HGBUR, BILIRUBINUR, KETONESUR, PROTEINUR, UROBILINOGEN, NITRITE, LEUKOCYTESUR Sepsis Labs: @LABRCNTIP (procalcitonin:4,lacticidven:4)  ) Recent Results (from the past 240 hour(s))  SARS Coronavirus 2 by RT PCR (hospital order, performed in [facility] hospital lab) Nasopharyngeal Nasopharyngeal Swab     Status: None   Collection Time: 10/27/19 10:06 PM    Specimen: Nasopharyngeal Swab  Result Value Ref Range Status   SARS Coronavirus 2 NEGATIVE NEGATIVE Final    Comment: (NOTE) SARS-CoV-2 target nucleic acids are NOT DETECTED.  The SARS-CoV-2 RNA is generally detectable in upper and lower respiratory specimens during the acute phase of infection. The lowest concentration of SARS-CoV-2 viral copies this assay can detect is 250 copies / mL. A negative result does not preclude SARS-CoV-2 infection and should not be used as the sole basis for treatment or other patient management decisions.  A negative result may occur with improper specimen collection / handling, submission of specimen other than nasopharyngeal swab, presence of  viral mutation(s) within the areas targeted by this assay, and inadequate number of viral copies (<250 copies / mL). A negative result must be combined with clinical observations, patient history, and epidemiological information.  Fact Sheet for Patients:   BoilerBrush.com.cy  Fact Sheet for Healthcare Providers: https://pope.com/  This test is not yet approved or  cleared by the [place] FDA and has been authorized for detection and/or diagnosis of SARS-CoV-2 by FDA under an Emergency Use Authorization (EUA).  This EUA will remain in effect (meaning this test can be used) for the duration of the COVID-19 declaration under Section 564(b)(1) of the Act, 21 U.S.C. section 360bbb-3(b)(1), unless the authorization is terminated or revoked sooner.  Performed at [facility], [address] [place], [place] [address]       Studies: DG Chest 2 View  Result Date: 10/27/2019 CLINICAL DATA:  Chest pain EXAM: CHEST - 2 VIEW COMPARISON:  None. FINDINGS: The heart size and mediastinal contours are within normal limits. Both lungs are clear. The visualized skeletal structures are unremarkable. IMPRESSION: No active cardiopulmonary disease. Electronically  Signed   By: Jonna Clark M.D.   On: 10/27/2019 20:52    Scheduled Meds: . amLODipine  10 mg Oral Daily  . aspirin  81 mg Oral Daily  . enoxaparin (LOVENOX) injection  40 mg Subcutaneous Daily  . HYDROcodone-acetaminophen  1 tablet Oral Q8H  . insulin aspart  0-5 Units Subcutaneous QHS  . insulin aspart  0-9 Units Subcutaneous TID WC  . insulin aspart  4 Units Subcutaneous TID WC  . insulin glargine  10 Units Subcutaneous BID  . lidocaine  2 patch Transdermal QHS  . LORazepam  0-4 mg Intravenous Q6H   Or  . LORazepam  0-4 mg Oral Q6H  . [START ON 10/30/2019] LORazepam  0-4 mg Intravenous Q12H   Or  . [START ON 10/30/2019] LORazepam  0-4 mg Oral Q12H  . thiamine  100 mg Oral Daily   Or  . thiamine  100 mg Intravenous Daily    Continuous Infusions: . lactated ringers Stopped (10/28/19 0210)     LOS: 1 day     Briant Cedar, MD [facility]  If 7PM-7AM, please contact night-coverage [email or web address] 10/28/2019, 2:56 PM

## 2019-10-28 NOTE — ED Notes (Signed)
CHECKED PATIENT BLOOD SUGAR IT WAS 272 NOTIFIED rn OF BLOOD SUGAR

## 2019-10-29 LAB — BASIC METABOLIC PANEL
Anion gap: 11 (ref 5–15)
BUN: 13 mg/dL (ref 6–20)
CO2: 26 mmol/L (ref 22–32)
Calcium: 9.3 mg/dL (ref 8.9–10.3)
Chloride: 96 mmol/L — ABNORMAL LOW (ref 98–111)
Creatinine, Ser: 1.34 mg/dL — ABNORMAL HIGH (ref 0.61–1.24)
GFR calc Af Amer: >60 mL/min (ref >60)
GFR calc non Af Amer: 58 mL/min — ABNORMAL LOW (ref >60)
Glucose, Bld: 307 mg/dL — ABNORMAL HIGH (ref 70–99)
Potassium: 3.7 mmol/L (ref 3.5–5.1)
Sodium: 133 mmol/L — ABNORMAL LOW (ref 135–145)

## 2019-10-29 LAB — GLUCOSE, CAPILLARY
Glucose-Capillary: 276 mg/dL — ABNORMAL HIGH (ref 70–99)
Glucose-Capillary: 266 mg/dL — ABNORMAL HIGH (ref 70–99)
Glucose-Capillary: 239 mg/dL — ABNORMAL HIGH (ref 70–99)
Glucose-Capillary: 285 mg/dL — ABNORMAL HIGH (ref 70–99)

## 2019-10-29 LAB — CBC WITH DIFFERENTIAL/PLATELET
Abs Immature Granulocytes: 0.02 10*3/uL (ref 0.00–0.07)
Basophils Absolute: 0 10*3/uL (ref 0.0–0.1)
Basophils Relative: 0 %
Eosinophils Absolute: 0.1 10*3/uL (ref 0.0–0.5)
Eosinophils Relative: 1 %
HCT: 42.7 % (ref 39.0–52.0)
Hemoglobin: 13.7 g/dL (ref 13.0–17.0)
Immature Granulocytes: 0 %
Lymphocytes Relative: 37 %
Lymphs Abs: 2.1 10*3/uL (ref 0.7–4.0)
MCH: 27.2 pg (ref 26.0–34.0)
MCHC: 32.1 g/dL (ref 30.0–36.0)
MCV: 84.9 fL (ref 80.0–100.0)
Monocytes Absolute: 0.5 10*3/uL (ref 0.1–1.0)
Monocytes Relative: 9 %
Neutro Abs: 3 10*3/uL (ref 1.7–7.7)
Neutrophils Relative %: 53 %
Platelets: 257 10*3/uL (ref 150–400)
RBC: 5.03 MIL/uL (ref 4.22–5.81)
RDW: 13.2 % (ref 11.5–15.5)
WBC: 5.7 10*3/uL (ref 4.0–10.5)
nRBC: 0 % (ref 0.0–0.2)

## 2019-10-29 MED ORDER — PANTOPRAZOLE SODIUM 40 MG PO TBEC
80.0000 mg | DELAYED_RELEASE_TABLET | Freq: Every day | ORAL | Status: DC
  Administered 2019-10-29 – 2019-10-31 (×3): 80 mg via ORAL
  Filled 2019-10-29 (×3): qty 2

## 2019-10-29 MED ORDER — CHLORDIAZEPOXIDE HCL 25 MG PO CAPS: 25.0000 mg | ORAL_CAPSULE | Freq: Every day | ORAL | Status: DC

## 2019-10-29 MED ORDER — CHLORDIAZEPOXIDE HCL 25 MG PO CAPS
25.0000 mg | ORAL_CAPSULE | Freq: Four times a day (QID) | ORAL | Status: AC
  Administered 2019-10-29 – 2019-10-30 (×4): 25 mg via ORAL
  Filled 2019-10-29 (×4): qty 1

## 2019-10-29 MED ORDER — CHLORDIAZEPOXIDE HCL 25 MG PO CAPS
25.0000 mg | ORAL_CAPSULE | Freq: Three times a day (TID) | ORAL | Status: AC
  Administered 2019-10-30 – 2019-10-31 (×3): 25 mg via ORAL
  Filled 2019-10-29 (×3): qty 1

## 2019-10-29 MED ORDER — GABAPENTIN 300 MG PO CAPS
300.0000 mg | ORAL_CAPSULE | Freq: Every day | ORAL | Status: DC
  Administered 2019-10-29 – 2019-10-30 (×2): 300 mg via ORAL
  Filled 2019-10-29 (×2): qty 1

## 2019-10-29 MED ORDER — HYDRALAZINE HCL 20 MG/ML IJ SOLN: 10.0000 mg | Freq: Three times a day (TID) | INTRAMUSCULAR | Status: DC | PRN

## 2019-10-29 MED ORDER — SODIUM CHLORIDE 0.9 % IV SOLN: INTRAVENOUS | Status: DC

## 2019-10-29 MED ORDER — INSULIN ASPART 100 UNIT/ML ~~LOC~~ SOLN
0.0000 [IU] | Freq: Three times a day (TID) | SUBCUTANEOUS | Status: DC
  Administered 2019-10-29: 5 [IU] via SUBCUTANEOUS
  Administered 2019-10-29: 8 [IU] via SUBCUTANEOUS
  Administered 2019-10-30: 15 [IU] via SUBCUTANEOUS
  Administered 2019-10-30: 5 [IU] via SUBCUTANEOUS
  Administered 2019-10-30: 11 [IU] via SUBCUTANEOUS
  Administered 2019-10-31: 3 [IU] via SUBCUTANEOUS
  Administered 2019-10-31: 11 [IU] via SUBCUTANEOUS

## 2019-10-29 MED ORDER — HYDROCODONE-ACETAMINOPHEN 5-325 MG PO TABS
1.0000 | ORAL_TABLET | Freq: Once | ORAL | Status: AC
  Administered 2019-10-29: 1 via ORAL
  Filled 2019-10-29: qty 1

## 2019-10-29 MED ORDER — CHLORDIAZEPOXIDE HCL 25 MG PO CAPS: 25.0000 mg | ORAL_CAPSULE | ORAL | Status: DC

## 2019-10-29 MED ORDER — ONDANSETRON HCL 4 MG/2ML IJ SOLN
4.0000 mg | Freq: Three times a day (TID) | INTRAMUSCULAR | Status: DC | PRN
  Administered 2019-10-29: 4 mg via INTRAVENOUS
  Filled 2019-10-29: qty 2

## 2019-10-29 MED ORDER — ROSUVASTATIN CALCIUM 20 MG PO TABS
40.0000 mg | ORAL_TABLET | Freq: Every day | ORAL | Status: DC
  Administered 2019-10-29 – 2019-10-31 (×3): 40 mg via ORAL
  Filled 2019-10-29 (×3): qty 2

## 2019-10-29 NOTE — Progress Notes (Signed)
PROGRESS NOTE  Alesandro Stueve GMW:[id number] DOB: 07-Jun-1960 DOA: 10/27/2019 PCP: Patient, No Pcp Per  HPI/Recap of past 24 hours: HPI from Dr Delman Cheadle is a 59 y.o. male with medical history significant for alcohol and opioid abuse, insulin-dependent type 2 diabetes, hypertension, CAD, GERD and anxiety and depression who presents with concerns of wanting to detox. States 3 years ago he had issues with lower back pain and since then has been taking more more opioids.  He has been taking 6-7 hydrocodone pills for the past 2 years with the last dose being this morning PTA.  Also reports many years of alcohol use and has increased in the past 2 weeks up to 18-20 beers daily.  Denies any suicidal ideations but knows eventually he might hurt himself so that is why he brought himself in here today.  He also stopped taking all of his home meds and insulin for the past 2 weeks since he just did not feel like it. ED Course: He had temperature of 99, tachycardic and hypertensive up to 170s.  Lab work notable for hyponatremia of 127, glucose of 620, anion gap 17, pH of 7.3, bicarb of 29.  CBC unremarkable.  Alcohol level elevated to 175.  UDS negative. Patient was placed on CIWA protocol and insulin drip and hospitalist was called for admission.    Today, patient reports some nausea, tremors, fatigue, denies any shortness of breath, chest pain, abdominal pain, diarrhea.  Discussed extensively with wife at bedside and son over the phone.  Requesting an outpatient detox program, social worker notified.   Assessment/Plan: Principal Problem:   DKA (diabetic ketoacidoses) (HCC) Active Problems:   Alcohol abuse   Opioid abuse (HCC)   HTN (hypertension)   CAD (coronary artery disease)   Mild DKA On admission presented with pH 7.3, bicarb 29, anion gap 17, BG of 620 A1c 14.8 S/p insulin drip--> improved Start SSI, Lantus twice daily, NovoLog 3 times daily, Accu-Cheks, hypoglycemic protocol Monitor  closely  Alcohol abuse with possible withdrawal 18-20 beers daily, last had 3 beers this morning on 8/31 CIWA protocol, started on Librium Consulted SW to help with detox  Opioid dependence for chronic back pain 6-7 hydrocodone once daily for the past several years Lidocaine patch, Norco as needed TOC consulted  Hypertension Continue amlodipine  CAD Continue daily aspirin         Malnutrition Type:      Malnutrition Characteristics:      Nutrition Interventions:       Estimated body mass index is 27.59 kg/m as calculated from the following:   Height as of this encounter: 5\' 9"  (1.753 m).   Weight as of this encounter: 84.7 kg.     Code Status: Full  Family Communication: Discussed with son and wife on 10/29/2019  Disposition Plan: Status is: Inpatient  Remains inpatient appropriate because:Inpatient level of care appropriate due to severity of illness   Dispo: The patient is from: Home              Anticipated d/c is to: Home              Anticipated d/c date is: 2 days              Patient currently is not medically stable to d/c.    Consultants:  None  Procedures:  None  Antimicrobials:  None  DVT prophylaxis: Lovenox   Objective: Vitals:   10/28/19 2009 10/29/19 0044 10/29/19 0604 10/29/19 1134  BP: (!) 156/98 (!) 146/88 (!) 138/92 (!) 139/99  Pulse: (!) 105 96 96 90  Resp: 20 20 20 14   Temp: 98.4 F (36.9 C) 98.6 F (37 C) 97.8 F (36.6 C) 97.8 F (36.6 C)  TempSrc: Oral Oral Oral Oral  SpO2: 99% 99% 99% 99%  Weight:  84.7 kg    Height:        Intake/Output Summary (Last 24 hours) at 10/29/2019 1724 Last data filed at 10/29/2019 1617 Gross per 24 hour  Intake 1880 ml  Output 2950 ml  Net -1070 ml   Filed Weights   10/27/19 1333 10/29/19 0044  Weight: 87.1 kg 84.7 kg    Exam:  General: NAD, noted tremors  Cardiovascular: S1, S2 present  Respiratory: CTAB  Abdomen: Soft, nontender, nondistended, bowel sounds  present  Musculoskeletal: No bilateral pedal edema noted  Skin: Normal  Psychiatry:  Fair mood    Data Reviewed: CBC: Recent Labs  Lab 10/27/19 1404 10/27/19 2013 10/28/19 0527 10/29/19 0503  WBC 5.9  --  5.3 5.7  NEUTROABS  --   --   --  3.0  HGB 14.3 17.7* 13.9 13.7  HCT 44.7 52.0 42.8 42.7  MCV 86.1  --  86.8 84.9  PLT 305  --  273 257   Basic Metabolic Panel: Recent Labs  Lab 10/27/19 1404 10/27/19 1404 10/27/19 2013 10/28/19 0047 10/28/19 0527 10/28/19 1438 10/29/19 0503  NA 127*   < > 128* 134* 135 130* 133*  K 3.9   < > 4.7 3.9 3.7 4.9 3.7  CL 88*  --   --  96* 96* 96* 96*  CO2 22  --   --  26 26 21* 26  GLUCOSE 620*  --   --  193* 193* 424* 307*  BUN 7  --   --  6 7 13 13   CREATININE 1.20  --   --  1.00 1.06 1.15 1.34*  CALCIUM 9.3  --   --  8.9 8.9 8.7* 9.3   < > = values in this interval not displayed.   GFR: Estimated Creatinine Clearance: 60.1 mL/min (A) (by C-G formula based on SCr of 1.34 mg/dL (H)). Liver Function Tests: Recent Labs  Lab 10/27/19 1404  AST 58*  ALT 49*  ALKPHOS 154*  BILITOT 0.4  PROT 7.6  ALBUMIN 3.4*   No results for input(s): LIPASE, AMYLASE in the last 168 hours. No results for input(s): AMMONIA in the last 168 hours. Coagulation Profile: No results for input(s): INR, PROTIME in the last 168 hours. Cardiac Enzymes: No results for input(s): CKTOTAL, CKMB, CKMBINDEX, TROPONINI in the last 168 hours. BNP (last 3 results) No results for input(s): PROBNP in the last 8760 hours. HbA1C: Recent Labs    10/28/19 0047  HGBA1C 14.8*   CBG: Recent Labs  Lab 10/28/19 2012 10/28/19 2138 10/29/19 0607 10/29/19 1124 10/29/19 1616  GLUCAP 471* 366* 276* 266* 239*   Lipid Profile: No results for input(s): CHOL, HDL, LDLCALC, TRIG, CHOLHDL, LDLDIRECT in the last 72 hours. Thyroid Function Tests: No results for input(s): TSH, T4TOTAL, FREET4, T3FREE, THYROIDAB in the last 72 hours. Anemia Panel: No results for  input(s): VITAMINB12, FOLATE, FERRITIN, TIBC, IRON, RETICCTPCT in the last 72 hours. Urine analysis: No results found for: COLORURINE, APPEARANCEUR, LABSPEC, PHURINE, GLUCOSEU, HGBUR, BILIRUBINUR, KETONESUR, PROTEINUR, UROBILINOGEN, NITRITE, LEUKOCYTESUR Sepsis Labs: @LABRCNTIP (procalcitonin:4,lacticidven:4)  ) Recent Results (from the past 240 hour(s))  SARS Coronavirus 2 by RT PCR (hospital order, performed in [facility]  hospital lab) Nasopharyngeal Nasopharyngeal Swab     Status: None   Collection Time: 10/27/19 10:06 PM   Specimen: Nasopharyngeal Swab  Result Value Ref Range Status   SARS Coronavirus 2 NEGATIVE NEGATIVE Final    Comment: (NOTE) SARS-CoV-2 target nucleic acids are NOT DETECTED.  The SARS-CoV-2 RNA is generally detectable in upper and lower respiratory specimens during the acute phase of infection. The lowest concentration of SARS-CoV-2 viral copies this assay can detect is 250 copies / mL. A negative result does not preclude SARS-CoV-2 infection and should not be used as the sole basis for treatment or other patient management decisions.  A negative result may occur with improper specimen collection / handling, submission of specimen other than nasopharyngeal swab, presence of viral mutation(s) within the areas targeted by this assay, and inadequate number of viral copies (<250 copies / mL). A negative result must be combined with clinical observations, patient history, and epidemiological information.  Fact Sheet for Patients:   BoilerBrush.com.cy  Fact Sheet for Healthcare Providers: https://pope.com/  This test is not yet approved or  cleared by the [place] FDA and has been authorized for detection and/or diagnosis of SARS-CoV-2 by FDA under an Emergency Use Authorization (EUA).  This EUA will remain in effect (meaning this test can be used) for the duration of the COVID-19 declaration under Section  564(b)(1) of the Act, 21 U.S.C. section 360bbb-3(b)(1), unless the authorization is terminated or revoked sooner.  Performed at [facility], [address] [place], [place] [address]       Studies: No results found.  Scheduled Meds:  amLODipine  10 mg Oral Daily   aspirin  81 mg Oral Daily   chlordiazePOXIDE  25 mg Oral QID   Followed by   Melene Muller ON 10/30/2019] chlordiazePOXIDE  25 mg Oral TID   Followed by   Melene Muller ON 10/31/2019] chlordiazePOXIDE  25 mg Oral BH-qamhs   Followed by   Melene Muller ON 11/01/2019] chlordiazePOXIDE  25 mg Oral Daily   enoxaparin (LOVENOX) injection  40 mg Subcutaneous Daily   insulin aspart  0-15 Units Subcutaneous TID WC   insulin aspart  0-5 Units Subcutaneous QHS   insulin aspart  4 Units Subcutaneous TID WC   insulin glargine  10 Units Subcutaneous BID   lidocaine  2 patch Transdermal QHS   LORazepam  0-4 mg Intravenous Q6H   Or   LORazepam  0-4 mg Oral Q6H   [START ON 10/30/2019] LORazepam  0-4 mg Intravenous Q12H   Or   [START ON 10/30/2019] LORazepam  0-4 mg Oral Q12H   thiamine  100 mg Oral Daily   Or   thiamine  100 mg Intravenous Daily    Continuous Infusions:  sodium chloride 75 mL/hr at 10/29/19 0836     LOS: 2 days     Briant Cedar, MD [facility]  If 7PM-7AM, please contact night-coverage [email or web address] 10/29/2019, 5:24 PM

## 2019-10-29 NOTE — Progress Notes (Signed)
Inpatient Diabetes Program Recommendations  AACE/ADA: New Consensus Statement on Inpatient Glycemic Control (2015)  Target Ranges:  Prepandial:   less than 140 mg/dL      Peak postprandial:   less than 180 mg/dL (1-2 hours)      Critically ill patients:  140 - 180 mg/dL   Lab Results  Component Value Date   GLUCAP 276 (H) 10/29/2019   HGBA1C 14.8 (H) 10/28/2019    Review of Glycemic Control Results for David Mcbride, David Mcbride (MRN [id number]) as of 10/29/2019 10:27  Ref. Range 10/28/2019 11:48 10/28/2019 17:09 10/28/2019 20:12 10/28/2019 21:38 10/29/2019 06:07  Glucose-Capillary Latest Ref Range: 70 - 99 mg/dL 349 (H) 179 (H) 150 (H) 366 (H) 276 (H)   Diabetes history:   DM 2 Outpatient Diabetes medications:  Metformin 500 mg bid, Glucotrol 10 mg bid, Januvia 50 mg daily, Lantus 30 units q HS  Current orders for Inpatient glycemic control:  Novolog moderate tid with meals and HS Novolog 4 units tid with meals (meal coverage) Lantus 10 units bid   Inpatient Diabetes Program Recommendations:     Lantus 15 units bid Novolog 6 units bid with meals if eats at least 50%  Will continue to follow while inpatient.  Thank you, Dulce Sellar, RN, BSN Diabetes Coordinator Inpatient Diabetes Program [phone] (team pager from 8a-5p)

## 2019-10-29 NOTE — Progress Notes (Signed)
CSW provided SA resources.

## 2019-10-29 NOTE — Plan of Care (Signed)
  Problem: Education: Goal: Knowledge of General Education information will improve Description: Including pain rating scale, medication(s)/side effects and non-pharmacologic comfort measures Outcome: Progressing   Problem: Health Behavior/Discharge Planning: Goal: Ability to manage health-related needs will improve Outcome: Progressing   Problem: Pain Managment: Goal: General experience of comfort will improve Outcome: Progressing   

## 2019-10-30 ENCOUNTER — Inpatient Hospital Stay (HOSPITAL_COMMUNITY): Payer: 59

## 2019-10-30 LAB — CBC WITH DIFFERENTIAL/PLATELET
Abs Immature Granulocytes: 0.01 10*3/uL (ref 0.00–0.07)
Basophils Absolute: 0 10*3/uL (ref 0.0–0.1)
Basophils Relative: 1 %
Eosinophils Absolute: 0 10*3/uL (ref 0.0–0.5)
Eosinophils Relative: 1 %
HCT: 44.5 % (ref 39.0–52.0)
Hemoglobin: 14.2 g/dL (ref 13.0–17.0)
Immature Granulocytes: 0 %
Lymphocytes Relative: 30 %
Lymphs Abs: 1.3 10*3/uL (ref 0.7–4.0)
MCH: 27.6 pg (ref 26.0–34.0)
MCHC: 31.9 g/dL (ref 30.0–36.0)
MCV: 86.4 fL (ref 80.0–100.0)
Monocytes Absolute: 0.5 10*3/uL (ref 0.1–1.0)
Monocytes Relative: 11 %
Neutro Abs: 2.5 10*3/uL (ref 1.7–7.7)
Neutrophils Relative %: 57 %
Platelets: 248 10*3/uL (ref 150–400)
RBC: 5.15 MIL/uL (ref 4.22–5.81)
RDW: 13.2 % (ref 11.5–15.5)
WBC: 4.4 10*3/uL (ref 4.0–10.5)
nRBC: 0 % (ref 0.0–0.2)

## 2019-10-30 LAB — BASIC METABOLIC PANEL
Anion gap: 11 (ref 5–15)
BUN: 7 mg/dL (ref 6–20)
CO2: 25 mmol/L (ref 22–32)
Calcium: 9 mg/dL (ref 8.9–10.3)
Chloride: 98 mmol/L (ref 98–111)
Creatinine, Ser: 1.19 mg/dL (ref 0.61–1.24)
GFR calc Af Amer: >60 mL/min (ref >60)
GFR calc non Af Amer: >60 mL/min (ref >60)
Glucose, Bld: 234 mg/dL — ABNORMAL HIGH (ref 70–99)
Potassium: 3.6 mmol/L (ref 3.5–5.1)
Sodium: 134 mmol/L — ABNORMAL LOW (ref 135–145)

## 2019-10-30 LAB — GLUCOSE, CAPILLARY
Glucose-Capillary: 240 mg/dL — ABNORMAL HIGH (ref 70–99)
Glucose-Capillary: 358 mg/dL — ABNORMAL HIGH (ref 70–99)
Glucose-Capillary: 326 mg/dL — ABNORMAL HIGH (ref 70–99)
Glucose-Capillary: 308 mg/dL — ABNORMAL HIGH (ref 70–99)

## 2019-10-30 MED ORDER — INSULIN GLARGINE 100 UNIT/ML ~~LOC~~ SOLN
12.0000 [IU] | Freq: Two times a day (BID) | SUBCUTANEOUS | Status: DC
  Administered 2019-10-30: 12 [IU] via SUBCUTANEOUS
  Filled 2019-10-30 (×2): qty 0.12

## 2019-10-30 MED ORDER — LORAZEPAM 2 MG/ML IJ SOLN
2.0000 mg | Freq: Once | INTRAMUSCULAR | Status: AC
  Administered 2019-10-30: 2 mg via INTRAVENOUS
  Filled 2019-10-30: qty 1

## 2019-10-30 MED ORDER — HYDROCODONE-ACETAMINOPHEN 5-325 MG PO TABS
1.0000 | ORAL_TABLET | Freq: Two times a day (BID) | ORAL | Status: DC | PRN
  Administered 2019-10-30 – 2019-10-31 (×2): 1 via ORAL
  Filled 2019-10-30 (×2): qty 1

## 2019-10-30 MED ORDER — INSULIN ASPART 100 UNIT/ML ~~LOC~~ SOLN
6.0000 [IU] | Freq: Three times a day (TID) | SUBCUTANEOUS | Status: DC
  Administered 2019-10-30 – 2019-10-31 (×4): 6 [IU] via SUBCUTANEOUS

## 2019-10-30 MED ORDER — GABAPENTIN 300 MG PO CAPS
300.0000 mg | ORAL_CAPSULE | Freq: Two times a day (BID) | ORAL | Status: DC
  Administered 2019-10-30 – 2019-10-31 (×2): 300 mg via ORAL
  Filled 2019-10-30 (×2): qty 1

## 2019-10-30 MED ORDER — CYCLOBENZAPRINE HCL 5 MG PO TABS
5.0000 mg | ORAL_TABLET | Freq: Three times a day (TID) | ORAL | Status: DC
  Administered 2019-10-30 – 2019-10-31 (×3): 5 mg via ORAL
  Filled 2019-10-30 (×3): qty 1

## 2019-10-30 MED ORDER — INSULIN GLARGINE 100 UNIT/ML ~~LOC~~ SOLN
15.0000 [IU] | Freq: Two times a day (BID) | SUBCUTANEOUS | Status: DC
  Administered 2019-10-30 – 2019-10-31 (×2): 15 [IU] via SUBCUTANEOUS
  Filled 2019-10-30 (×3): qty 0.15

## 2019-10-30 NOTE — Progress Notes (Addendum)
Inpatient Diabetes Program Recommendations  AACE/ADA: New Consensus Statement on Inpatient Glycemic Control (2015)  Target Ranges:  Prepandial:   less than 140 mg/dL      Peak postprandial:   less than 180 mg/dL (1-2 hours)      Critically ill patients:  140 - 180 mg/dL   Lab Results  Component Value Date   GLUCAP 240 (H) 10/30/2019   HGBA1C 14.8 (H) 10/28/2019    Review of Glycemic Control Results for David Mcbride, David Mcbride (MRN [id number]) as of 10/30/2019 11:40  Ref. Range 10/29/2019 06:07 10/29/2019 11:24 10/29/2019 16:16 10/29/2019 20:54 10/30/2019 06:20  Glucose-Capillary Latest Ref Range: 70 - 99 mg/dL 244 (H) 975 (H) 300 (H) 285 (H) 240 (H)       Diabetes history:  DM 2 Outpatient Diabetes medications: Metformin 500 mg bid, Glucotrol 10 mg bid, Januvia 50 mg daily, Lantus 30 units q HS  Current orders for Inpatient glycemic control: Novolog moderate tid with meals and HS Novolog 4 units tid with meals (meal coverage) Lantus 12 units bid (increased today)  Inpatient Diabetes Program Recommendations:     Lantus 15 units bid Novolog 6 units bid with meals if eats at least 50%   Will continue to follow while inpatient.  Thank you, Dulce Sellar, RN, BSN Diabetes Coordinator Inpatient Diabetes Program [phone] (team pager from 8a-5p)

## 2019-10-30 NOTE — Progress Notes (Addendum)
MD notified of CIWA score of 19. No new orders at this time.  New orders received. See MAR.

## 2019-10-30 NOTE — Progress Notes (Signed)
PROGRESS NOTE  Derrian Poli KPT:[id number] DOB: 10/10/1960 DOA: 10/27/2019 PCP: Patient, No Pcp Per  HPI/Recap of past 24 hours: HPI from Dr Delman Cheadle is a 59 y.o. male with medical history significant for alcohol and opioid abuse, insulin-dependent type 2 diabetes, hypertension, CAD, GERD and anxiety and depression who presents with concerns of wanting to detox. States 3 years ago he had issues with lower back pain and since then has been taking more more opioids.  He has been taking 6-7 hydrocodone pills for the past 2 years with the last dose being this morning PTA.  Also reports many years of alcohol use and has increased in the past 2 weeks up to 18-20 beers daily.  Denies any suicidal ideations but knows eventually he might hurt himself so that is why he brought himself in here today.  He also stopped taking all of his home meds and insulin for the past 2 weeks since he just did not feel like it. ED Course: He had temperature of 99, tachycardic and hypertensive up to 170s.  Lab work notable for hyponatremia of 127, glucose of 620, anion gap 17, pH of 7.3, bicarb of 29.  CBC unremarkable.  Alcohol level elevated to 175.  UDS negative. Patient was placed on CIWA protocol and insulin drip and hospitalist was called for admission.    Today, patient still with tremors, denies any chest pain, abdominal pain, fever/chills.  Still reports persistent back pain.  Noted mild tachycardia, with uncontrolled hypertension, likely still withdrawing.  Blood sugar noted to be uncontrolled as well   Assessment/Plan: Principal Problem:   DKA (diabetic ketoacidoses) (HCC) Active Problems:   Alcohol abuse   Opioid abuse (HCC)   HTN (hypertension)   CAD (coronary artery disease)   Mild DKA/type 2 DM Uncontrolled On admission presented with pH 7.3, bicarb 29, anion gap 17, BG of 620 A1c 14.8 S/p insulin drip SSI, Lantus twice daily, NovoLog 3 times daily, adjust insulin regimen pending CBGs,  Accu-Cheks, hypoglycemic protocol Monitor closely  Alcohol abuse with possible withdrawal 18-20 beers daily, last had 3 beers the morning on 8/31 CIWA protocol, continue Librium Consulted SW to help with detox  Opioid dependence for chronic back pain Thoracic and lumbar x-ray unremarkable 6-7 hydrocodone once daily for the past several years Lidocaine patch, start Flexeril, increase gabapentin to 300 mg twice daily, taper Norco off TOC consulted  Hypertension Continue amlodipine, IV hydralazine as needed  CAD Continue daily aspirin         Malnutrition Type:      Malnutrition Characteristics:      Nutrition Interventions:       Estimated body mass index is 27.79 kg/m as calculated from the following:   Height as of this encounter: 5\' 9"  (1.753 m).   Weight as of this encounter: 85.4 kg.     Code Status: Full  Family Communication: Discussed with son and wife on 10/29/2019  Disposition Plan: Status is: Inpatient  Remains inpatient appropriate because:Inpatient level of care appropriate due to severity of illness   Dispo: The patient is from: Home              Anticipated d/c is to: Home              Anticipated d/c date is: 2 days              Patient currently is not medically stable to d/c.    Consultants:  None  Procedures:  None  Antimicrobials:  None  DVT prophylaxis: Lovenox   Objective: Vitals:   10/30/19 0937 10/30/19 0939 10/30/19 0941 10/30/19 1133  BP: 134/86 134/86 (!) 129/91 (!) 133/94  Pulse: (!) 101 98 (!) 101 (!) 101  Resp: 16   16  Temp: 98.4 F (36.9 C)   98.9 F (37.2 C)  TempSrc: Oral   Oral  SpO2: 100% 100% 100% 96%  Weight:      Height:        Intake/Output Summary (Last 24 hours) at 10/30/2019 1450 Last data filed at 10/30/2019 1300 Gross per 24 hour  Intake 1076 ml  Output 3475 ml  Net -2399 ml   Filed Weights   10/27/19 1333 10/29/19 0044 10/30/19 0356  Weight: 87.1 kg 84.7 kg 85.4 kg     Exam:  General: NAD, noted tremors  Cardiovascular: S1, S2 present  Respiratory: CTAB  Abdomen: Soft, nontender, nondistended, bowel sounds present  Musculoskeletal: No bilateral pedal edema noted  Skin: Normal  Psychiatry:  Fair mood    Data Reviewed: CBC: Recent Labs  Lab 10/27/19 1404 10/27/19 2013 10/28/19 0527 10/29/19 0503 10/30/19 0540  WBC 5.9  --  5.3 5.7 4.4  NEUTROABS  --   --   --  3.0 2.5  HGB 14.3 17.7* 13.9 13.7 14.2  HCT 44.7 52.0 42.8 42.7 44.5  MCV 86.1  --  86.8 84.9 86.4  PLT 305  --  273 257 248   Basic Metabolic Panel: Recent Labs  Lab 10/28/19 0047 10/28/19 0527 10/28/19 1438 10/29/19 0503 10/30/19 0540  NA 134* 135 130* 133* 134*  K 3.9 3.7 4.9 3.7 3.6  CL 96* 96* 96* 96* 98  CO2 26 26 21* 26 25  GLUCOSE 193* 193* 424* 307* 234*  BUN 6 7 13 13 7   CREATININE 1.00 1.06 1.15 1.34* 1.19  CALCIUM 8.9 8.9 8.7* 9.3 9.0   GFR: Estimated Creatinine Clearance: 73.3 mL/min (by C-G formula based on SCr of 1.19 mg/dL). Liver Function Tests: Recent Labs  Lab 10/27/19 1404  AST 58*  ALT 49*  ALKPHOS 154*  BILITOT 0.4  PROT 7.6  ALBUMIN 3.4*   No results for input(s): LIPASE, AMYLASE in the last 168 hours. No results for input(s): AMMONIA in the last 168 hours. Coagulation Profile: No results for input(s): INR, PROTIME in the last 168 hours. Cardiac Enzymes: No results for input(s): CKTOTAL, CKMB, CKMBINDEX, TROPONINI in the last 168 hours. BNP (last 3 results) No results for input(s): PROBNP in the last 8760 hours. HbA1C: Recent Labs    10/28/19 0047  HGBA1C 14.8*   CBG: Recent Labs  Lab 10/29/19 1124 10/29/19 1616 10/29/19 2054 10/30/19 0620 10/30/19 1139  GLUCAP 266* 239* 285* 240* 358*   Lipid Profile: No results for input(s): CHOL, HDL, LDLCALC, TRIG, CHOLHDL, LDLDIRECT in the last 72 hours. Thyroid Function Tests: No results for input(s): TSH, T4TOTAL, FREET4, T3FREE, THYROIDAB in the last 72 hours. Anemia  Panel: No results for input(s): VITAMINB12, FOLATE, FERRITIN, TIBC, IRON, RETICCTPCT in the last 72 hours. Urine analysis: No results found for: COLORURINE, APPEARANCEUR, LABSPEC, PHURINE, GLUCOSEU, HGBUR, BILIRUBINUR, KETONESUR, PROTEINUR, UROBILINOGEN, NITRITE, LEUKOCYTESUR Sepsis Labs: @LABRCNTIP (procalcitonin:4,lacticidven:4)  ) Recent Results (from the past 240 hour(s))  SARS Coronavirus 2 by RT PCR (hospital order, performed in [facility] hospital lab) Nasopharyngeal Nasopharyngeal Swab     Status: None   Collection Time: 10/27/19 10:06 PM   Specimen: Nasopharyngeal Swab  Result Value Ref Range Status   SARS Coronavirus 2 NEGATIVE NEGATIVE  Final    Comment: (NOTE) SARS-CoV-2 target nucleic acids are NOT DETECTED.  The SARS-CoV-2 RNA is generally detectable in upper and lower respiratory specimens during the acute phase of infection. The lowest concentration of SARS-CoV-2 viral copies this assay can detect is 250 copies / mL. A negative result does not preclude SARS-CoV-2 infection and should not be used as the sole basis for treatment or other patient management decisions.  A negative result may occur with improper specimen collection / handling, submission of specimen other than nasopharyngeal swab, presence of viral mutation(s) within the areas targeted by this assay, and inadequate number of viral copies (<250 copies / mL). A negative result must be combined with clinical observations, patient history, and epidemiological information.  Fact Sheet for Patients:   BoilerBrush.com.cy  Fact Sheet for Healthcare Providers: https://pope.com/  This test is not yet approved or  cleared by the [place] FDA and has been authorized for detection and/or diagnosis of SARS-CoV-2 by FDA under an Emergency Use Authorization (EUA).  This EUA will remain in effect (meaning this test can be used) for the duration of the COVID-19  declaration under Section 564(b)(1) of the Act, 21 U.S.C. section 360bbb-3(b)(1), unless the authorization is terminated or revoked sooner.  Performed at [facility], [address], [place], [place] [address]       Studies: DG Thoracic Spine W/Swimmers  Result Date: 10/30/2019 CLINICAL DATA:  Back pain over the last 3 weeks. Struck volume machine. EXAM: THORACIC SPINE - 3 VIEWS COMPARISON:  None. FINDINGS: There is no evidence of thoracic spine fracture. Alignment is normal. No other significant bone abnormalities are identified. IMPRESSION: Negative. Electronically Signed   By: Paulina Fusi M.D.   On: 10/30/2019 13:23   DG Lumbar Spine 2-3 Views  Result Date: 10/30/2019 CLINICAL DATA:  Back pain over the last 3 weeks.  Struck by machine. EXAM: LUMBAR SPINE - 2-3 VIEW COMPARISON:  None. FINDINGS: There is no evidence of lumbar spine fracture. Alignment is normal. Intervertebral disc spaces are maintained. IMPRESSION: Negative. Electronically Signed   By: Paulina Fusi M.D.   On: 10/30/2019 13:24    Scheduled Meds: . amLODipine  10 mg Oral Daily  . aspirin  81 mg Oral Daily  . chlordiazePOXIDE  25 mg Oral TID   Followed by  . [START ON 10/31/2019] chlordiazePOXIDE  25 mg Oral BH-qamhs   Followed by  . [START ON 11/01/2019] chlordiazePOXIDE  25 mg Oral Daily  . cyclobenzaprine  5 mg Oral TID  . enoxaparin (LOVENOX) injection  40 mg Subcutaneous Daily  . gabapentin  300 mg Oral BID  . insulin aspart  0-15 Units Subcutaneous TID WC  . insulin aspart  0-5 Units Subcutaneous QHS  . insulin aspart  6 Units Subcutaneous TID WC  . insulin glargine  15 Units Subcutaneous BID  . lidocaine  2 patch Transdermal QHS  . LORazepam  0-4 mg Intravenous Q12H   Or  . LORazepam  0-4 mg Oral Q12H  . pantoprazole  80 mg Oral Daily  . rosuvastatin  40 mg Oral Daily  . thiamine  100 mg Oral Daily   Or  . thiamine  100 mg Intravenous Daily    Continuous Infusions:    LOS: 3 days      Briant Cedar, MD [facility]  If 7PM-7AM, please contact night-coverage [email or web address] 10/30/2019, 2:50 PM

## 2019-10-30 NOTE — TOC Initial Note (Signed)
Transition of Care [facility]) - Initial/Assessment Note    Patient Details  Name: David Mcbride MRN: [id number] Date of Birth: [birth year]  Transition of Care [facility]) CM/SW Contact:    [address], [place] Phone Number: 10/30/2019, 3:37 PM  Clinical Narrative:                 CSW met with patient and wife at beside, wife called son on the phone. CSW provided family with referrals for substance abuse treatment. Family discussed that they were under the impression that the sole reason for the pt being in the hospital was for detox, CSW adv the patient was in the hospital because of a medical issue. Family stated that they wanted the main admission to be because of detox and they were told by someone at [facility] that he would need to detox inpatient and then he would be transferred directly to [facility]. CSW tried to clarify with patient that there may have been some miscommunication. Pt and family not happy.     Barriers to Discharge: Continued Medical Work up, Ship broker   Patient Goals and CMS Choice Patient states their goals for this hospitalization and ongoing recovery are:: To go to rehab CMS Medicare.gov Compare Post Acute Care list provided to:: Patient Choice offered to / list presented to : Patient, Spouse, Adult Children  Expected Discharge Plan and Services   In-house Referral: Clinical Social Work     Living arrangements for the past 2 months: Single Family Home                                      Prior Living Arrangements/Services Living arrangements for the past 2 months: Single Family Home Lives with:: Spouse Patient language and need for interpreter reviewed:: Yes Do you feel safe going back to the place where you live?: Yes      Need for Family Participation in Patient Care: Yes (Comment) Care giver support system in place?: Yes (comment)   Criminal Activity/Legal Involvement Pertinent to Current Situation/Hospitalization: No - Comment as  needed  Activities of Daily Living      Permission Sought/Granted Permission sought to share information with : Family Supports Permission granted to share information with : Yes, Verbal Permission Granted  Share Information with NAME: Earland Reish     Permission granted to share info w Relationship: Son  Permission granted to share info w Contact Information: [phone]  Emotional Assessment Appearance:: Appears stated age Attitude/Demeanor/Rapport: Guarded Affect (typically observed): Agitated Orientation: : Oriented to Self, Oriented to Place, Oriented to  Time, Oriented to Situation Alcohol / Substance Use: Not Applicable Psych Involvement: No (comment)  Admission diagnosis:  DKA (diabetic ketoacidoses) ([place]) [E11.10] Alcohol withdrawal syndrome with complication ([place]) [W29.562] Diabetic ketoacidosis without coma associated with type 2 diabetes mellitus ([place]) [E11.10] Patient Active Problem List   Diagnosis Date Noted  . DKA (diabetic ketoacidoses) ([place]) 10/27/2019  . Alcohol abuse 10/27/2019  . Opioid abuse ([place]) 10/27/2019  . HTN (hypertension) 10/27/2019  . CAD (coronary artery disease) 10/27/2019   PCP:  Patient, No Pcp Per Pharmacy:   [facility] [address] [place] [address] Phone: [phone] Fax: [phone]     Social Determinants of Health (SDOH) Interventions    Readmission Risk Interventions No flowsheet data found.

## 2019-10-31 LAB — GLUCOSE, CAPILLARY
Glucose-Capillary: 250 mg/dL — ABNORMAL HIGH (ref 70–99)
Glucose-Capillary: 240 mg/dL — ABNORMAL HIGH (ref 70–99)
Glucose-Capillary: 345 mg/dL — ABNORMAL HIGH (ref 70–99)
Glucose-Capillary: 288 mg/dL — ABNORMAL HIGH (ref 70–99)

## 2019-10-31 MED ORDER — CYCLOBENZAPRINE HCL 5 MG PO TABS: 5.0000 mg | ORAL_TABLET | Freq: Three times a day (TID) | ORAL | 0 refills | Status: AC | PRN

## 2019-10-31 MED ORDER — LANTUS SOLOSTAR 100 UNIT/ML ~~LOC~~ SOPN: 15.0000 [IU] | PEN_INJECTOR | Freq: Two times a day (BID) | SUBCUTANEOUS | 11 refills | Status: AC

## 2019-10-31 MED ORDER — LIDOCAINE 5 % EX PTCH: 2.0000 | MEDICATED_PATCH | Freq: Every day | CUTANEOUS | 0 refills | Status: DC

## 2019-10-31 MED ORDER — THIAMINE HCL 100 MG PO TABS: 100.0000 mg | ORAL_TABLET | Freq: Every day | ORAL | 0 refills | Status: AC

## 2019-10-31 MED ORDER — CHLORDIAZEPOXIDE HCL 25 MG PO CAPS: 25.0000 mg | ORAL_CAPSULE | Freq: Every day | ORAL | 0 refills | Status: DC

## 2019-10-31 MED ORDER — CYCLOBENZAPRINE HCL 5 MG PO TABS: 5.0000 mg | ORAL_TABLET | Freq: Three times a day (TID) | ORAL | 0 refills | Status: DC | PRN

## 2019-10-31 MED ORDER — THIAMINE HCL 100 MG PO TABS: 100.0000 mg | ORAL_TABLET | Freq: Every day | ORAL | 0 refills | Status: DC

## 2019-10-31 MED ORDER — GABAPENTIN 300 MG PO CAPS: 300.0000 mg | ORAL_CAPSULE | Freq: Two times a day (BID) | ORAL | 0 refills | Status: AC

## 2019-10-31 MED ORDER — CHLORDIAZEPOXIDE HCL 25 MG PO CAPS: 25.0000 mg | ORAL_CAPSULE | Freq: Every day | ORAL | 0 refills | Status: AC

## 2019-10-31 NOTE — Progress Notes (Signed)
Results for AXCEL, HORSCH (MRN [id number]) as of 10/31/2019 11:58  Ref. Range 10/30/2019 16:57 10/30/2019 21:39 10/31/2019 06:51 10/31/2019 08:24 10/31/2019 11:09  Glucose-Capillary Latest Ref Range: 70 - 99 mg/dL 092 (H) 330 (H) 076 (H) 240 (H) 345 (H)  Noted that CBGs continue to be greater than 180 mg/dl.  Recommend increasing Lantus to 18 units BID if blood sugars continue to be elevated.   Smith Mince RN BSN CDE Diabetes Coordinator Pager: [phone]  8am-5pm

## 2019-10-31 NOTE — TOC Progression Note (Signed)
Transition of Care [facility]) - Progression Note    Patient Details  Name: David Mcbride MRN: [id number] Date of Birth: 1960-07-05  Transition of Care [facility]) CM/SW Contact  Patrice [place], [place] Phone Number: [phone] 10/31/2019, 8:57 AM  Clinical Narrative:     CSW was messaged by MD in regards to patient going to [facility] placement. CSW recommended that patient be seen by psych first to ascertain if a psych placement is needed and that his CIWA score would need to be lower.  TOC team will continue to assist with discharge planning needs.    Barriers to Discharge: Continued Medical Work up, Conservator, museum/gallery and Services   In-house Referral: Clinical Social Work     Living arrangements for the past 2 months: Single Family Home                                       Social Determinants of Health (SDOH) Interventions    Readmission Risk Interventions No flowsheet data found.

## 2019-10-31 NOTE — Discharge Summary (Signed)
Discharge Summary  David Mcbride UMP:[id number] DOB: Jun 02, 1957  PCP: Patient, No Pcp Per  Admit date: 10/27/2019 Discharge date: 10/31/2019  Time spent: 40 mins  Recommendations for Outpatient Follow-up:  1. Follow-up with PCP in 1 week 2. Follow-up with outpatient mental health/psychiatry  Discharge Diagnoses:  Active Hospital Problems   Diagnosis Date Noted  . DKA (diabetic ketoacidoses) (HCC) 10/27/2019  . Alcohol abuse 10/27/2019  . Opioid abuse (HCC) 10/27/2019  . HTN (hypertension) 10/27/2019  . CAD (coronary artery disease) 10/27/2019    Resolved Hospital Problems  No resolved problems to display.    Discharge Condition: Stable  Diet recommendation: Heart healthy/mod carb  Vitals:   10/31/19 0900 10/31/19 1200  BP:  109/72  Pulse:  (!) 101  Resp: 18 18  Temp:  98.5 F (36.9 C)  SpO2:  97%    History of present illness:  David Mcbride a 59 y.o.malewith medical history significant foralcohol and opioid abuse, insulin-dependent type 2 diabetes, hypertension, CAD, GERD and anxiety and depression who presents with concerns of wanting to detox. States 3 years ago he had issues with lower back pain and since then has been taking more more opioids. He has been taking 6-7 hydrocodone pills for the past 2 years with the last dose being this morning PTA. Also reports many years of alcohol use and has increased in the past 2 weeks up to 18-20 beers daily. Denies any suicidal ideations but knows eventually he might hurt himself so that is why he brought himself in here today. He also stopped taking all of his home meds and insulin for the past 2 weeks since he just did not feel like it. ED Course:He had temperature of 99, tachycardic and hypertensive up to 170s. Lab work notable for hyponatremia of 127, glucose of 620, anion gap 17, pH of 7.3, bicarb of 29. CBC unremarkable. Alcohol level elevated to 175. UDS negative. Patient was placed on CIWA protocol and insulin  drip and hospitalist was called for admission.    Today, patient reports feeling much better, denies any further nausea/vomiting, tremors, abdominal pain, chest pain, fever/chills.  Still reports persistent back pain.  Blood sugar with better today, but still uncontrolled.  Discussed with patient and son extensively about discharge plans.  Advised to follow-up with his primary care in [place] and possibly establish an outpatient psychiatrist for further management.  Discussed extensively with son who works in [employer], stated he would assist in placing his father in an outpatient facility for further detox program.  Discussed with patient about his uncontrolled diabetes, ensured me that he will be compliant with his diabetic medicine.   Hospital Course:  Principal Problem:   DKA (diabetic ketoacidoses) (HCC) Active Problems:   Alcohol abuse   Opioid abuse (HCC)   HTN (hypertension)   CAD (coronary artery disease)   Mild DKA/type 2 DM Uncontrolled On admission presented with pH 7.3, bicarb 29, anion gap 17, BG of 620 A1c 14.8 S/p insulin drip DC patient on increased dose of Lantus 15 units twice daily, daily Januvia Advised patient to keep his CBG log and follow-up with his PCP for further adjustments  Alcohol abuse with possible withdrawal 18-20 beers daily, last had 3 beers the morning on 8/31 DC patient on tapered dose of Librium Outpatient detox program as discussed with son  Opioid dependence for chronic back pain Thoracic and lumbar x-ray unremarkable 6-7 hydrocodone once daily for the past several years Discharge on lidocaine patch, Flexeril prn, increase gabapentin to  300 mg twice  Discontinued Norco Outpatient detox program as discussed with son  Hypertension Continue amlodipine  CAD Continue daily aspirin         Malnutrition Type:      Malnutrition Characteristics:      Nutrition Interventions:      Estimated body mass index is  27.78 kg/m as calculated from the following:   Height as of this encounter: 5\' 9"  (1.753 m).   Weight as of this encounter: 85.3 kg.    Procedures:  None  Consultations:  None  Discharge Exam: BP 109/72 (BP Location: Right Arm)   Pulse (!) 101   Temp 98.5 F (36.9 C) (Skin)   Resp 18   Ht 5\' 9"  (1.753 m)   Wt 85.3 kg   SpO2 97%   BMI 27.78 kg/m   General: NAD Cardiovascular: S1, S2 present Respiratory: CTA B  Discharge Instructions You were cared for by a hospitalist during your hospital stay. If you have any questions about your discharge medications or the care you received while you were in the hospital after you are discharged, you can call the unit and asked to speak with the hospitalist on call if the hospitalist that took care of you is not available. Once you are discharged, your primary care physician will handle any further medical issues. Please note that NO REFILLS for any discharge medications will be authorized once you are discharged, as it is imperative that you return to your primary care physician (or establish a relationship with a primary care physician if you do not have one) for your aftercare needs so that they can reassess your need for medications and monitor your lab values.  Discharge Instructions    Diet - low sodium heart healthy   Complete by: As directed    Increase activity slowly   Complete by: As directed      Allergies as of 10/31/2019   No Known Allergies     Medication List    STOP taking these medications   HYDROcodone-acetaminophen 10-325 MG tablet Commonly known as: NORCO     TAKE these medications   amLODipine 10 MG tablet Commonly known as: NORVASC Take 10 mg by mouth daily.   aspirin 81 MG EC tablet Take 81 mg by mouth daily.   chlordiazePOXIDE 25 MG capsule Commonly known as: LIBRIUM Take 1 capsule (25 mg total) by mouth daily for 2 days. Start taking on: November 01, 2019   cyclobenzaprine 5 MG tablet Commonly  known as: FLEXERIL Take 1 tablet (5 mg total) by mouth 3 (three) times daily as needed for up to 10 days for muscle spasms.   gabapentin 300 MG capsule Commonly known as: NEURONTIN Take 1 capsule (300 mg total) by mouth 2 (two) times daily. What changed: when to take this   Januvia 50 MG tablet Generic drug: sitaGLIPtin Take 50 mg by mouth daily.   Lantus SoloStar 100 UNIT/ML Solostar Pen Generic drug: insulin glargine Inject 15 Units into the skin 2 (two) times daily. What changed:   how much to take  when to take this   lidocaine 5 % Commonly known as: LIDODERM Place 2 patches onto the skin at bedtime for 15 days. Remove & Discard patch within 12 hours or as directed by MD   omeprazole 40 MG capsule Commonly known as: PRILOSEC Take 40 mg by mouth daily.   potassium chloride 10 MEQ tablet Commonly known as: KLOR-CON Take 10 mEq by mouth daily.   rosuvastatin  40 MG tablet Commonly known as: CRESTOR Take 40 mg by mouth daily.   thiamine 100 MG tablet Take 1 tablet (100 mg total) by mouth daily. Start taking on: November 01, 2019      No Known Allergies    The results of significant diagnostics from this hospitalization (including imaging, microbiology, ancillary and laboratory) are listed below for reference.    Significant Diagnostic Studies: DG Chest 2 View  Result Date: 10/27/2019 CLINICAL DATA:  Chest pain EXAM: CHEST - 2 VIEW COMPARISON:  None. FINDINGS: The heart size and mediastinal contours are within normal limits. Both lungs are clear. The visualized skeletal structures are unremarkable. IMPRESSION: No active cardiopulmonary disease. Electronically Signed   By: Jonna ClarkBindu  Avutu M.D.   On: 10/27/2019 20:52   DG Thoracic Spine W/Swimmers  Result Date: 10/30/2019 CLINICAL DATA:  Back pain over the last 3 weeks. Struck volume machine. EXAM: THORACIC SPINE - 3 VIEWS COMPARISON:  None. FINDINGS: There is no evidence of thoracic spine fracture. Alignment is  normal. No other significant bone abnormalities are identified. IMPRESSION: Negative. Electronically Signed   By: Paulina FusiMark  Shogry M.D.   On: 10/30/2019 13:23   DG Lumbar Spine 2-3 Views  Result Date: 10/30/2019 CLINICAL DATA:  Back pain over the last 3 weeks.  Struck by machine. EXAM: LUMBAR SPINE - 2-3 VIEW COMPARISON:  None. FINDINGS: There is no evidence of lumbar spine fracture. Alignment is normal. Intervertebral disc spaces are maintained. IMPRESSION: Negative. Electronically Signed   By: Paulina FusiMark  Shogry M.D.   On: 10/30/2019 13:24    Microbiology: Recent Results (from the past 240 hour(s))  SARS Coronavirus 2 by RT PCR (hospital order, [facility]) Nasopharyngeal Nasopharyngeal Swab     Status: None   Collection Time: 10/27/19 10:06 PM   Specimen: Nasopharyngeal Swab  Result Value Ref Range Status   SARS Coronavirus 2 NEGATIVE NEGATIVE Final    Comment: (NOTE) SARS-CoV-2 target nucleic acids are NOT DETECTED.  The SARS-CoV-2 RNA is generally detectable in upper and lower respiratory specimens during the acute phase of infection. The lowest concentration of SARS-CoV-2 viral copies this assay can detect is 250 copies / mL. A negative result does not preclude SARS-CoV-2 infection and should not be used as the sole basis for treatment or other patient management decisions.  A negative result may occur with improper specimen collection / handling, submission of specimen other than nasopharyngeal swab, presence of viral mutation(s) within the areas targeted by this assay, and inadequate number of viral copies (<250 copies / mL). A negative result must be combined with clinical observations, patient history, and epidemiological information.  Fact Sheet for Patients:   BoilerBrush.com.cyhttps://www.fda.gov/media/136312/download  Fact Sheet for Healthcare Providers: https://pope.com/https://www.fda.gov/media/136313/download  This test is not yet approved or  cleared by the [place] FDA and has  been authorized for detection and/or diagnosis of SARS-CoV-2 by FDA under an Emergency Use Authorization (EUA).  This EUA will remain in effect (meaning this test can be used) for the duration of the COVID-19 declaration under Section 564(b)(1) of the Act, 21 U.S.C. section 360bbb-3(b)(1), unless the authorization is terminated or revoked sooner.  Performed at [facility], [address] [place], [place] [address]      Labs: Basic Metabolic Panel: Recent Labs  Lab 10/28/19 0047 10/28/19 0527 10/28/19 1438 10/29/19 0503 10/30/19 0540  NA 134* 135 130* 133* 134*  K 3.9 3.7 4.9 3.7 3.6  CL 96* 96* 96* 96* 98  CO2 26  26 21* 26 25  GLUCOSE 193* 193* 424* 307* 234*  BUN 6 7 13 13 7   CREATININE 1.00 1.06 1.15 1.34* 1.19  CALCIUM 8.9 8.9 8.7* 9.3 9.0   Liver Function Tests: Recent Labs  Lab 10/27/19 1404  AST 58*  ALT 49*  ALKPHOS 154*  BILITOT 0.4  PROT 7.6  ALBUMIN 3.4*   No results for input(s): LIPASE, AMYLASE in the last 168 hours. No results for input(s): AMMONIA in the last 168 hours. CBC: Recent Labs  Lab 10/27/19 1404 10/27/19 2013 10/28/19 0527 10/29/19 0503 10/30/19 0540  WBC 5.9  --  5.3 5.7 4.4  NEUTROABS  --   --   --  3.0 2.5  HGB 14.3 17.7* 13.9 13.7 14.2  HCT 44.7 52.0 42.8 42.7 44.5  MCV 86.1  --  86.8 84.9 86.4  PLT 305  --  273 257 248   Cardiac Enzymes: No results for input(s): CKTOTAL, CKMB, CKMBINDEX, TROPONINI in the last 168 hours. BNP: BNP (last 3 results) No results for input(s): BNP in the last 8760 hours.  ProBNP (last 3 results) No results for input(s): PROBNP in the last 8760 hours.  CBG: Recent Labs  Lab 10/30/19 2139 10/31/19 0651 10/31/19 0824 10/31/19 1109 10/31/19 1252  GLUCAP 308* 250* 240* 345* 288*       Signed:  12/31/19, MD [facility] 10/31/2019, 2:59 PM

## 2020-08-02 ENCOUNTER — Inpatient Hospital Stay
Admit: 2020-08-02 | Discharge: 2020-08-02 | Disposition: A | Discharge status: Home / Self Care | Payer: BLUE CROSS/BLUE SHIELD | Attending: Family Medicine

## 2020-08-02 DIAGNOSIS — E1165 Type 2 diabetes mellitus with hyperglycemia: Secondary | ICD-10-CM

## 2020-08-02 LAB — METABOLIC PANEL, COMPREHENSIVE
A-G Ratio: 0.8 — ABNORMAL LOW (ref 1.1–2.2)
ALT (SGPT): 85 U/L — ABNORMAL HIGH (ref 12–78)
AST (SGOT): 98 U/L — ABNORMAL HIGH (ref 15–37)
Albumin: 3.6 g/dL (ref 3.5–5.0)
Alk. phosphatase: 144 U/L — ABNORMAL HIGH (ref 45–117)
Anion gap: 11 mmol/L (ref 5–15)
BUN/Creatinine ratio: 10 — ABNORMAL LOW (ref 12–20)
BUN: 9 mg/dL (ref 6–20)
Bilirubin, total: 0.4 mg/dL (ref 0.2–1.0)
CO2: 28 mmol/L (ref 21–32)
Calcium: 9 mg/dL (ref 8.5–10.1)
Chloride: 100 mmol/L (ref 97–108)
Creatinine: 0.94 mg/dL (ref 0.70–1.30)
GFR est AA: >60 mL/min/{1.73_m2} (ref >60)
GFR est non-AA: >60 mL/min/{1.73_m2} (ref >60)
Globulin: 4.5 g/dL — ABNORMAL HIGH (ref 2.0–4.0)
Glucose: 235 mg/dL — ABNORMAL HIGH (ref 65–100)
Potassium: 3.4 mmol/L — ABNORMAL LOW (ref 3.5–5.1)
Protein, total: 8.1 g/dL (ref 6.4–8.2)
Sodium: 139 mmol/L (ref 136–145)

## 2020-08-02 LAB — CBC WITH AUTOMATED DIFF
ABS. BASOPHILS: 0 10*3/uL (ref 0.0–0.1)
ABS. EOSINOPHILS: 0.1 10*3/uL (ref 0.0–0.4)
ABS. IMM. GRANS.: 0 10*3/uL (ref 0.00–0.04)
ABS. LYMPHOCYTES: 3.4 10*3/uL (ref 0.8–3.5)
ABS. MONOCYTES: 0.4 10*3/uL (ref 0.0–1.0)
ABS. NEUTROPHILS: 1.3 10*3/uL — ABNORMAL LOW (ref 1.8–8.0)
BASOPHILS: 1 % (ref 0–1)
EOSINOPHILS: 1 % (ref 0–7)
HCT: 43.5 % (ref 36.6–50.3)
HGB: 14 g/dL (ref 12.1–17.0)
IMMATURE GRANULOCYTES: 0 % (ref 0.0–0.5)
LYMPHOCYTES: 65 % — ABNORMAL HIGH (ref 12–49)
MCH: 27.8 PG (ref 26.0–34.0)
MCHC: 32.2 g/dL (ref 30.0–36.5)
MCV: 86.5 FL (ref 80.0–99.0)
MONOCYTES: 7 % (ref 5–13)
MPV: 10 FL (ref 8.9–12.9)
NEUTROPHILS: 26 % — ABNORMAL LOW (ref 32–75)
PLATELET: 232 10*3/uL (ref 150–400)
RBC: 5.03 M/uL (ref 4.10–5.70)
RDW: 14.5 % (ref 11.5–14.5)
WBC: 5.2 10*3/uL (ref 4.1–11.1)

## 2020-08-02 LAB — GLUCOSE, POC: Glucose (POC): 204 mg/dL — ABNORMAL HIGH (ref 65–117)

## 2020-08-02 LAB — CBC WITH AUTO DIFFERENTIAL
Basophils %: 1 % (ref 0–1)
Basophils Absolute: 0 10*3/uL (ref 0.0–0.1)
Eosinophils %: 1 % (ref 0–7)
Eosinophils Absolute: 0.1 10*3/uL (ref 0.0–0.4)
Granulocyte Absolute Count: 0 10*3/uL (ref 0.00–0.04)
Hematocrit: 43.5 % (ref 36.6–50.3)
Hemoglobin: 14 g/dL (ref 12.1–17.0)
Immature Granulocytes: 0 % (ref 0.0–0.5)
Lymphocytes %: 65 % — ABNORMAL HIGH (ref 12–49)
Lymphocytes Absolute: 3.4 10*3/uL (ref 0.8–3.5)
MCH: 27.8 PG (ref 26.0–34.0)
MCHC: 32.2 g/dL (ref 30.0–36.5)
MCV: 86.5 FL (ref 80.0–99.0)
MPV: 10 FL (ref 8.9–12.9)
Monocytes %: 7 % (ref 5–13)
Monocytes Absolute: 0.4 10*3/uL (ref 0.0–1.0)
Neutrophils %: 26 % — ABNORMAL LOW (ref 32–75)
Neutrophils Absolute: 1.3 10*3/uL — ABNORMAL LOW (ref 1.8–8.0)
Platelets: 232 10*3/uL (ref 150–400)
RBC: 5.03 M/uL (ref 4.10–5.70)
RDW: 14.5 % (ref 11.5–14.5)
WBC: 5.2 10*3/uL (ref 4.1–11.1)

## 2020-08-02 LAB — COMPREHENSIVE METABOLIC PANEL
ALT: 85 U/L — ABNORMAL HIGH (ref 12–78)
AST: 98 U/L — ABNORMAL HIGH (ref 15–37)
Albumin/Globulin Ratio: 0.8 — ABNORMAL LOW (ref 1.1–2.2)
Albumin: 3.6 g/dL (ref 3.5–5.0)
Alkaline Phosphatase: 144 U/L — ABNORMAL HIGH (ref 45–117)
Anion Gap: 11 mmol/L (ref 5–15)
BUN: 9 mg/dL (ref 6–20)
Bun/Cre Ratio: 10 — ABNORMAL LOW (ref 12–20)
CO2: 28 mmol/L (ref 21–32)
Calcium: 9 mg/dL (ref 8.5–10.1)
Chloride: 100 mmol/L (ref 97–108)
Creatinine: 0.94 mg/dL (ref 0.70–1.30)
EGFR IF NonAfrican American: >60 mL/min/{1.73_m2} (ref >60)
GFR African American: >60 mL/min/{1.73_m2} (ref >60)
Globulin: 4.5 g/dL — ABNORMAL HIGH (ref 2.0–4.0)
Glucose: 235 mg/dL — ABNORMAL HIGH (ref 65–100)
Potassium: 3.4 mmol/L — ABNORMAL LOW (ref 3.5–5.1)
Sodium: 139 mmol/L (ref 136–145)
Total Bilirubin: 0.4 mg/dL (ref 0.2–1.0)
Total Protein: 8.1 g/dL (ref 6.4–8.2)

## 2020-08-02 LAB — POCT GLUCOSE: POC Glucose: 204 mg/dL — ABNORMAL HIGH (ref 65–117)

## 2020-08-02 MED ORDER — SODIUM CHLORIDE 0.9% BOLUS IV
0.9 % | Freq: Once | INTRAVENOUS | Status: AC
Start: 2020-08-02 — End: 2020-08-02
  Administered 2020-08-02: 09:00:00 via INTRAVENOUS

## 2020-08-02 MED ORDER — POTASSIUM CHLORIDE SR 10 MEQ TAB
10 mEq | ORAL | Status: AC
Start: 2020-08-02 — End: 2020-08-02
  Administered 2020-08-02: 10:00:00 via ORAL

## 2020-08-02 MED ORDER — NOVOLIN 70-30 FLEXPEN U-100 INSULIN 100 UNIT/ML (70-30) SUBCUTANEOUS
100 unit/mL (70-30) | SUBCUTANEOUS | 0 refills | Status: AC
Start: 2020-08-02 — End: ?

## 2020-08-02 MED FILL — SODIUM CHLORIDE 0.9 % IV: INTRAVENOUS | Qty: 1000

## 2020-08-02 MED FILL — POTASSIUM CHLORIDE SR 10 MEQ TAB: 10 mEq | ORAL | Qty: 4

## 2020-08-02 NOTE — ED Notes (Signed)
Pt states he feels that his blood sugar is high. States he ran out of 70/30 insulin 3 days ago. Has no other complaints at this time.

## 2020-08-02 NOTE — ED Provider Notes (Signed)
ED Provider Notes by Mirian Mo, DO at 08/02/20 0541                Author: Mirian Mo, DO  Service: --  Author Type: Physician       Filed: 08/05/20 1025  Date of Service: 08/02/20 0541  Status: Signed          Editor: Mirian Mo, DO (Physician)               EMERGENCY DEPARTMENT HISTORY AND PHYSICAL EXAM           Date: 08/02/2020   Patient Name: Antonio Bast Sr.        History of Presenting Illness          Chief Complaint       Patient presents with        ?  High Blood Sugar           History Provided By:       HPI: Willette Pa., is  a very pleasant 60 y.o. male presenting  to the ED with a chief complaint of concern for high blood sugar.  Patient states he ran out of his 70/30 insulin 3 days ago.  He states overall he does not feel well.  No abdominal pain.  No nausea or vomiting.  No fevers chills rigors.      There are no other complaints, changes, or physical findings at this time.      PCP: Other, Phys, MD        No current facility-administered medications on file prior to encounter.          Current Outpatient Medications on File Prior to Encounter          Medication  Sig  Dispense  Refill           ?  amLODIPine (NORVASC) 10 mg tablet  Take 10 mg by mouth.         ?  gabapentin (NEURONTIN) 600 mg tablet  Take 600 mg by mouth At bedtime.               ?  DULoxetine (CYMBALTA) 60 mg capsule  Take  by mouth.                 Past History        Past Medical History:     Past Medical History:        Diagnosis  Date         ?  Diabetes ([place])       ?  High cholesterol           ?  Hypertension             Past Surgical History:   History reviewed. No pertinent surgical history.      Family History:   History reviewed. No pertinent family history.      Social History:     Social History          Tobacco Use         ?  Smoking status:  Never Smoker     ?  Smokeless tobacco:  Never Used       Vaping Use         ?  Vaping Use:  Never used       Substance Use Topics         ?   Alcohol use:  Yes  Alcohol/week:  74.0 standard drinks         Types:  74 Cans of beer per week         ?  Drug use:  Never           Allergies:   No Known Allergies           Review of Systems        Review of Systems    Constitutional: Positive for activity change. Negative for appetite change, chills, fatigue and fever.    HENT: Negative for congestion and sore throat.     Eyes: Negative for photophobia and visual disturbance.    Respiratory: Negative for cough, shortness of breath and wheezing.     Cardiovascular: Negative for chest pain, palpitations and leg swelling.    Gastrointestinal: Negative for abdominal pain, diarrhea, nausea and vomiting.    Endocrine: Negative for cold intolerance and heat intolerance.    Musculoskeletal: Negative for gait problem and joint swelling.    Skin: Negative for color change and rash.    Neurological: Negative for dizziness and headaches.            Physical Exam        Physical Exam   Constitutional :        General: He is not in acute distress.     Appearance: Normal appearance. He is not toxic-appearing.    HENT:       Head: Normocephalic and atraumatic.      Right Ear: External ear normal.      Left Ear: External ear normal.      Mouth/Throat:      Mouth: Mucous membranes are dry .      Pharynx: Oropharynx is clear.   Eyes :       Extraocular Movements: Extraocular movements intact.      Conjunctiva/sclera: Conjunctivae normal.   Cardiovascular:       Rate and Rhythm: Normal rate and regular rhythm.      Pulses: Normal pulses.      Heart sounds: Normal heart sounds.    Pulmonary:       Effort: Pulmonary effort is normal. No respiratory distress.      Breath sounds: Normal breath sounds. No wheezing, rhonchi or rales.    Abdominal:      General: There is no distension.      Palpations: Abdomen is soft.      Tenderness: There is no abdominal tenderness. There is no guarding or rebound.     Musculoskeletal:          General: No deformity.      Cervical back:  Normal range of motion and neck supple.      Right lower leg: No edema.      Left lower leg: No edema.    Skin:      Capillary Refill: Capillary refill takes less than 2 seconds.      Findings: No erythema or rash.    Neurological:       General: No focal deficit present.      Mental Status: He is alert and oriented to person, place, and time.      Gait: Gait normal.   Psychiatric:         Mood and Affect: Mood normal.         Behavior: Behavior normal.               Lab and Diagnostic Study Results  Labs -         Recent Results (from the past 12 hour(s))     CBC WITH AUTOMATED DIFF          Collection Time: 08/05/20  7:18 AM         Result  Value  Ref Range            WBC  5.7  4.1 - 11.1 K/uL       RBC  4.81  4.10 - 5.70 M/uL       HGB  13.4  12.1 - 17.0 g/dL       HCT  41.5  36.6 - 50.3 %       MCV  86.3  80.0 - 99.0 FL       MCH  27.9  26.0 - 34.0 PG       MCHC  32.3  30.0 - 36.5 g/dL       RDW  14.1  11.5 - 14.5 %       PLATELET  151  150 - 400 K/uL       MPV  11.4  8.9 - 12.9 FL       NRBC  0.0  0.0 PER 100 WBC       ABSOLUTE NRBC  0.00  0.00 - 0.01 K/uL       NEUTROPHILS  50  32 - 75 %       LYMPHOCYTES  42  12 - 49 %       MONOCYTES  7  5 - 13 %       EOSINOPHILS  1  0 - 7 %       BASOPHILS  0  0 - 1 %       IMMATURE GRANULOCYTES  0  0 - 0.5 %       ABS. NEUTROPHILS  2.8  1.8 - 8.0 K/UL       ABS. LYMPHOCYTES  2.4  0.8 - 3.5 K/UL       ABS. MONOCYTES  0.4  0.0 - 1.0 K/UL       ABS. EOSINOPHILS  0.1  0.0 - 0.4 K/UL       ABS. BASOPHILS  0.0  0.0 - 0.1 K/UL       ABS. IMM. GRANS.  0.0  0.00 - 0.04 K/UL       DF  AUTOMATED          METABOLIC PANEL, COMPREHENSIVE          Collection Time: 08/05/20  7:18 AM         Result  Value  Ref Range            Sodium  138  136 - 145 mmol/L       Potassium  3.6  3.5 - 5.1 mmol/L       Chloride  107  97 - 108 mmol/L       CO2  25  21 - 32 mmol/L       Anion gap  6  5 - 15 mmol/L       Glucose  193 (H)  65 - 100 mg/dL       BUN  8  6 - 20 mg/dL       Creatinine  0.93   0.70 - 1.30 mg/dL       BUN/Creatinine ratio  9 (L)  12 - 20         GFR est AA  >  60  >60 ml/min/1.1m       GFR est non-AA  >60  >60 ml/min/1.722m      Calcium  8.7  8.5 - 10.1 mg/dL       Bilirubin, total  0.5  0.2 - 1.0 mg/dL       AST (SGOT)  28  15 - 37 U/L       ALT (SGPT)  44  12 - 78 U/L       Alk. phosphatase  107  45 - 117 U/L       Protein, total  7.0  6.4 - 8.2 g/dL       Albumin  2.9 (L)  3.5 - 5.0 g/dL       Globulin  4.1 (H)  2.0 - 4.0 g/dL       A-G Ratio  0.7 (L)  1.1 - 2.2         GLUCOSE, POC          Collection Time: 08/05/20  8:42 AM         Result  Value  Ref Range            Glucose (POC)  189 (H)  65 - 117 mg/dL            Performed by  LeJolaine ArtistN (Traveler)             Radiologic Studies -    '@lastxrresult' @     CT Results   (Last 48 hours)          None                 CXR Results   (Last 48 hours)          None                       Medical Decision Making     - I am the first provider for this patient.   - I reviewed the vital signs, available nursing notes, past medical history, past surgical history, family history and social history.   - Initial assessment performed. The patients presenting problems have been discussed, and they are in agreement with the care plan formulated and outlined with them.  I have encouraged them to ask questions as they arise throughout their visit.      Vital Signs-Reviewed the patient's vital signs.   No data found.         ED Course/ Provider Notes (Medical Decision Making):       Patient presented to the emergency department with a chief complaint of high blood sugar.   On examination the patient is nontoxic and well-appearing.  Vitals were reviewed per above.  Blood glucose 204.  No anion gap metabolic acidosis.  Patient is mildly dry appearing on exam.  Feeling significantly better after IV fluids.  Patient was given  temporary refill of his insulin with instructions to follow-up with PCP.      RoHaynes Bastr. was given a thorough list of  signs and symptoms that would warrant an immediate return to the emergency department. Otherwise  RoHaynes Bastr. will follow up with PCP.            Procedures     Medical Decision Makingedical Decision Making   Performed by: JoMirian MoDO   Procedures  None            Disposition  Disposition:       Home       All of the diagnostic tests were reviewed and questions answered. Diagnosis, care plan and treatment options were discussed.  The patient understands the instructions and will follow up as directed. The patients results have been reviewed with them.   They have been counseled regarding their diagnosis.  The patient verbally convey understanding and agreement of the signs, symptoms, diagnosis, treatment and prognosis and additionally agrees to follow up as recommended with their PCP in 24 - 48 hours.   They also agree with the care-plan and convey that all of their questions have been answered.  I have also put together some discharge instructions for them that include: 1) educational information regarding their diagnosis, 2) how to care for their  diagnosis at home, as well a 3) list of reasons why they would want to return to the ED prior to their follow-up appointment, should their condition change.            DISCHARGE PLAN:      1. This patient is currently admitted, however, discharge medications can only be displayed within the current admission encounter.            2.      Follow-up Information               Follow up With  Specialties  Details  Why  [place]    Call     [facility]   [phone]              Dr. Bea Graff MD, FACP    Call     Please call to make an appointment      [place].   [place], [place]                   3.  Return to ED if worse          4.      Discharge Medication List as of 08/02/2020  5:26 AM              CONTINUE these  medications which have CHANGED          Details        insulin nph-regular human rec (NovoLIN 70-30 FlexPen U-100) 100 unit/mL (70-30) inpn  Inject 48 units qAM, 28 units qhs, Normal, Disp-2 Each, R-0                     CONTINUE these medications which have NOT CHANGED          Details        amLODIPine (NORVASC) 10 mg tablet  Take 10 mg by mouth., Historical Med               gabapentin (NEURONTIN) 600 mg tablet  Take 600 mg by mouth At bedtime., Historical Med               DULoxetine (CYMBALTA) 60 mg capsule  Take  by mouth., Historical Med               buprenorphine-naloxone (SUBOXONE) 4-1 mg film sublingual film  by SubLINGual route., Historical Med  glipiZIDE (GLUCOTROL) 10 mg tablet  Take 10 mg by mouth., Historical Med               folic acid (FOLVITE) 1 mg tablet  Take 1 tablet (1 mg total) by mouth once daily., Historical Med               fenofibrate nanocrystallized (TRICOR) 145 mg tablet  Take 145 mg by mouth daily., Historical Med               ergocalciferol (ERGOCALCIFEROL) 1,250 mcg (50,000 unit) capsule  Take 50,000 Units by mouth., Historical Med               cyclobenzaprine (FLEXERIL) 5 mg tablet  Take 1 tablet (5 mg total) by mouth 3 (three) times daily as needed  for muscle spasms., Historical Med               aspirin delayed-release 81 mg tablet  Take 81 mg by mouth., Historical Med                     STOP taking these medications                  insulin NPH/insulin regular (HumuLIN 70/30 U-100 Insulin) 100 unit/mL (70-30) injection  Comments:    Reason for Stopping:                                  Diagnosis        Clinical Impression:        1.  Hyperglycemia         2.  Dehydration            Attestations:      Mirian Mo, DO      Please note that this dictation was completed with Dragon, the computer voice recognition software.  Quite often unanticipated grammatical, syntax, homophones, and other interpretive errors are inadvertently  transcribed by the computer  software.  Please disregard these errors.  Please excuse any errors that have escaped final proofreading.  Thank you.

## 2020-08-03 ENCOUNTER — Inpatient Hospital Stay
Admit: 2020-08-03 | Discharge: 2020-08-05 | Disposition: A | Discharge status: Home / Self Care | Payer: BLUE CROSS/BLUE SHIELD | Attending: Infectious Disease | Admitting: Infectious Disease

## 2020-08-03 DIAGNOSIS — F10230 Alcohol dependence with withdrawal, uncomplicated: Secondary | ICD-10-CM

## 2020-08-03 LAB — CBC WITH AUTOMATED DIFF
ABS. BASOPHILS: 0 10*3/uL (ref 0.0–0.1)
ABS. EOSINOPHILS: 0 10*3/uL (ref 0.0–0.4)
ABS. IMM. GRANS.: 0 10*3/uL (ref 0.00–0.04)
ABS. LYMPHOCYTES: 1.4 10*3/uL (ref 0.8–3.5)
ABS. MONOCYTES: 0.3 10*3/uL (ref 0.0–1.0)
ABS. NEUTROPHILS: 3.2 10*3/uL (ref 1.8–8.0)
BASOPHILS: 0 % (ref 0–1)
EOSINOPHILS: 0 % (ref 0–7)
HCT: 40.6 % (ref 36.6–50.3)
HGB: 13.2 g/dL (ref 12.1–17.0)
IMMATURE GRANULOCYTES: 0 % (ref 0.0–0.5)
LYMPHOCYTES: 28 % (ref 12–49)
MCH: 28.1 PG (ref 26.0–34.0)
MCHC: 32.5 g/dL (ref 30.0–36.5)
MCV: 86.6 FL (ref 80.0–99.0)
MONOCYTES: 6 % (ref 5–13)
MPV: 10.8 FL (ref 8.9–12.9)
NEUTROPHILS: 66 % (ref 32–75)
PLATELET: 199 10*3/uL (ref 150–400)
RBC: 4.69 M/uL (ref 4.10–5.70)
RDW: 14.6 % — ABNORMAL HIGH (ref 11.5–14.5)
WBC: 4.9 10*3/uL (ref 4.1–11.1)

## 2020-08-03 LAB — EKG, 12 LEAD, INITIAL
Atrial Rate: 111 {beats}/min
Calculated P Axis: 67 degrees
Calculated R Axis: 27 degrees
Calculated T Axis: 81 degrees
P-R Interval: 144 ms
Q-T Interval: 354 ms
QRS Duration: 70 ms
QTC Calculation (Bezet): 481 ms
Ventricular Rate: 111 {beats}/min

## 2020-08-03 LAB — METABOLIC PANEL, COMPREHENSIVE
A-G Ratio: 1 — ABNORMAL LOW (ref 1.1–2.2)
ALT (SGPT): 102 U/L — ABNORMAL HIGH (ref 12–78)
AST (SGOT): 103 U/L — ABNORMAL HIGH (ref 15–37)
Albumin: 4 g/dL (ref 3.5–5.0)
Alk. phosphatase: 150 U/L — ABNORMAL HIGH (ref 45–117)
Anion gap: 15 mmol/L (ref 5–15)
BUN/Creatinine ratio: 8 — ABNORMAL LOW (ref 12–20)
BUN: 8 mg/dL (ref 6–20)
Bilirubin, total: 0.6 mg/dL (ref 0.2–1.0)
CO2: 25 mmol/L (ref 21–32)
Calcium: 9.6 mg/dL (ref 8.5–10.1)
Chloride: 99 mmol/L (ref 97–108)
Creatinine: 1 mg/dL (ref 0.70–1.30)
GFR est AA: >60 mL/min/{1.73_m2} (ref >60)
GFR est non-AA: >60 mL/min/{1.73_m2} (ref >60)
Globulin: 4.2 g/dL — ABNORMAL HIGH (ref 2.0–4.0)
Glucose: 88 mg/dL (ref 65–100)
Potassium: 3.9 mmol/L (ref 3.5–5.1)
Protein, total: 8.2 g/dL (ref 6.4–8.2)
Sodium: 139 mmol/L (ref 136–145)

## 2020-08-03 LAB — MAGNESIUM
Magnesium: 1.6 mg/dL (ref 1.6–2.4)
Magnesium: 1.6 mg/dL (ref 1.6–2.4)

## 2020-08-03 LAB — LIPASE
Lipase: 203 U/L (ref 73–393)
Lipase: 203 U/L (ref 73–393)

## 2020-08-03 LAB — GLUCOSE, POC: Glucose (POC): 82 mg/dL (ref 65–117)

## 2020-08-03 LAB — TROPONIN-HIGH SENSITIVITY: Troponin-High Sensitivity: 15 ng/L (ref 0–76)

## 2020-08-03 LAB — COMPREHENSIVE METABOLIC PANEL
ALT: 102 U/L — ABNORMAL HIGH (ref 12–78)
AST: 103 U/L — ABNORMAL HIGH (ref 15–37)
Albumin/Globulin Ratio: 1 — ABNORMAL LOW (ref 1.1–2.2)
Albumin: 4 g/dL (ref 3.5–5.0)
Alkaline Phosphatase: 150 U/L — ABNORMAL HIGH (ref 45–117)
Anion Gap: 15 mmol/L (ref 5–15)
BUN: 8 mg/dL (ref 6–20)
Bun/Cre Ratio: 8 — ABNORMAL LOW (ref 12–20)
CO2: 25 mmol/L (ref 21–32)
Calcium: 9.6 mg/dL (ref 8.5–10.1)
Chloride: 99 mmol/L (ref 97–108)
Creatinine: 1 mg/dL (ref 0.70–1.30)
EGFR IF NonAfrican American: >60 mL/min/{1.73_m2} (ref >60)
GFR African American: >60 mL/min/{1.73_m2} (ref >60)
Globulin: 4.2 g/dL — ABNORMAL HIGH (ref 2.0–4.0)
Glucose: 88 mg/dL (ref 65–100)
Potassium: 3.9 mmol/L (ref 3.5–5.1)
Sodium: 139 mmol/L (ref 136–145)
Total Bilirubin: 0.6 mg/dL (ref 0.2–1.0)
Total Protein: 8.2 g/dL (ref 6.4–8.2)

## 2020-08-03 LAB — CBC WITH AUTO DIFFERENTIAL
Basophils %: 0 % (ref 0–1)
Basophils Absolute: 0 10*3/uL (ref 0.0–0.1)
Eosinophils %: 0 % (ref 0–7)
Eosinophils Absolute: 0 10*3/uL (ref 0.0–0.4)
Granulocyte Absolute Count: 0 10*3/uL (ref 0.00–0.04)
Hematocrit: 40.6 % (ref 36.6–50.3)
Hemoglobin: 13.2 g/dL (ref 12.1–17.0)
Immature Granulocytes: 0 % (ref 0.0–0.5)
Lymphocytes %: 28 % (ref 12–49)
Lymphocytes Absolute: 1.4 10*3/uL (ref 0.8–3.5)
MCH: 28.1 PG (ref 26.0–34.0)
MCHC: 32.5 g/dL (ref 30.0–36.5)
MCV: 86.6 FL (ref 80.0–99.0)
MPV: 10.8 FL (ref 8.9–12.9)
Monocytes %: 6 % (ref 5–13)
Monocytes Absolute: 0.3 10*3/uL (ref 0.0–1.0)
Neutrophils %: 66 % (ref 32–75)
Neutrophils Absolute: 3.2 10*3/uL (ref 1.8–8.0)
Platelets: 199 10*3/uL (ref 150–400)
RBC: 4.69 M/uL (ref 4.10–5.70)
RDW: 14.6 % — ABNORMAL HIGH (ref 11.5–14.5)
WBC: 4.9 10*3/uL (ref 4.1–11.1)

## 2020-08-03 LAB — POCT GLUCOSE: POC Glucose: 82 mg/dL (ref 65–117)

## 2020-08-03 LAB — EKG 12-LEAD
Atrial Rate: 111 {beats}/min
P Axis: 67 degrees
P-R Interval: 144 ms
Q-T Interval: 354 ms
QRS Duration: 70 ms
QTc Calculation (Bazett): 481 ms
R Axis: 27 degrees
T Axis: 81 degrees
Ventricular Rate: 111 {beats}/min

## 2020-08-03 LAB — TROPONIN, HIGH SENSITIVITY: Troponin, High Sensitivity: 15 ng/L (ref 0–76)

## 2020-08-03 MED ORDER — LORAZEPAM 2 MG/ML IJ SOLN
2 mg/mL | Freq: Once | INTRAMUSCULAR | Status: AC
Start: 2020-08-03 — End: 2020-08-03
  Administered 2020-08-03: 17:00:00 via INTRAVENOUS

## 2020-08-03 MED ORDER — ACETAMINOPHEN 650 MG RECTAL SUPPOSITORY
650 mg | Freq: Four times a day (QID) | RECTAL | Status: DC | PRN
Start: 2020-08-03 — End: 2020-08-05

## 2020-08-03 MED ORDER — LORAZEPAM 2 MG/ML IJ SOLN
2 mg/mL | INTRAMUSCULAR | Status: DC | PRN
Start: 2020-08-03 — End: 2020-08-05
  Administered 2020-08-04 – 2020-08-05 (×3): via INTRAVENOUS

## 2020-08-03 MED ORDER — CHLORDIAZEPOXIDE 10 MG CAP
10 mg | Freq: Three times a day (TID) | ORAL | Status: DC
Start: 2020-08-03 — End: 2020-08-05
  Administered 2020-08-04 – 2020-08-05 (×5): via ORAL

## 2020-08-03 MED ORDER — SODIUM CHLORIDE 0.9% BOLUS IV
0.9 % | Freq: Once | INTRAVENOUS | Status: AC
Start: 2020-08-03 — End: 2020-08-03
  Administered 2020-08-03: 14:00:00 via INTRAVENOUS

## 2020-08-03 MED ORDER — PANTOPRAZOLE 40 MG TAB, DELAYED RELEASE
40 mg | Freq: Two times a day (BID) | ORAL | Status: DC
Start: 2020-08-03 — End: 2020-08-05
  Administered 2020-08-04 – 2020-08-05 (×3): via ORAL

## 2020-08-03 MED ORDER — GLUCAGON 1 MG INJECTION
1 mg | INTRAMUSCULAR | Status: DC | PRN
Start: 2020-08-03 — End: 2020-08-05

## 2020-08-03 MED ORDER — ONDANSETRON 4 MG TAB, RAPID DISSOLVE
4 mg | Freq: Three times a day (TID) | ORAL | Status: DC | PRN
Start: 2020-08-03 — End: 2020-08-05

## 2020-08-03 MED ORDER — SODIUM CHLORIDE 0.9 % IJ SYRG
INTRAMUSCULAR | Status: DC | PRN
Start: 2020-08-03 — End: 2020-08-05

## 2020-08-03 MED ORDER — FOLIC ACID 1 MG TAB
1 mg | Freq: Once | ORAL | Status: AC
Start: 2020-08-03 — End: 2020-08-03
  Administered 2020-08-03: 14:00:00 via ORAL

## 2020-08-03 MED ORDER — LISINOPRIL 20 MG TAB
20 mg | Freq: Every day | ORAL | Status: DC
Start: 2020-08-03 — End: 2020-08-05
  Administered 2020-08-04 – 2020-08-05 (×2): via ORAL

## 2020-08-03 MED ORDER — THIAMINE 100 MG/ML INJECTION
100 mg/mL | Freq: Every day | INTRAMUSCULAR | Status: DC
Start: 2020-08-03 — End: 2020-08-03
  Administered 2020-08-03: 15:00:00 via INTRAVENOUS

## 2020-08-03 MED ORDER — LORAZEPAM 2 MG/ML IJ SOLN
2 mg/mL | Freq: Once | INTRAMUSCULAR | Status: AC
Start: 2020-08-03 — End: 2020-08-03
  Administered 2020-08-03: 16:00:00 via INTRAVENOUS

## 2020-08-03 MED ORDER — LORAZEPAM 2 MG/ML IJ SOLN
2 mg/mL | Freq: Once | INTRAMUSCULAR | Status: DC
Start: 2020-08-03 — End: 2020-08-03

## 2020-08-03 MED ORDER — INSULIN GLARGINE 100 UNIT/ML INJECTION
100 unit/mL | Freq: Every evening | SUBCUTANEOUS | Status: DC
Start: 2020-08-03 — End: 2020-08-05
  Administered 2020-08-04 – 2020-08-05 (×2): via SUBCUTANEOUS

## 2020-08-03 MED ORDER — DEXTROSE 10% IN WATER (D10W) IV
10 % | INTRAVENOUS | Status: DC | PRN
Start: 2020-08-03 — End: 2020-08-05

## 2020-08-03 MED ORDER — METOPROLOL TARTRATE 25 MG TAB
25 mg | Freq: Two times a day (BID) | ORAL | Status: DC
Start: 2020-08-03 — End: 2020-08-05
  Administered 2020-08-04 – 2020-08-05 (×4): via ORAL

## 2020-08-03 MED ORDER — ONDANSETRON (PF) 4 MG/2 ML INJECTION
4 mg/2 mL | Freq: Four times a day (QID) | INTRAMUSCULAR | Status: DC | PRN
Start: 2020-08-03 — End: 2020-08-05

## 2020-08-03 MED ORDER — GLUCOSE 4 GRAM CHEWABLE TAB
4 gram | ORAL | Status: DC | PRN
Start: 2020-08-03 — End: 2020-08-05

## 2020-08-03 MED ORDER — ONDANSETRON (PF) 4 MG/2 ML INJECTION
4 mg/2 mL | INTRAMUSCULAR | Status: DC
Start: 2020-08-03 — End: 2020-08-03

## 2020-08-03 MED ORDER — FOLIC ACID 5 MG/ML IJ SOLN
5 mg/mL | Freq: Once | INTRAMUSCULAR | Status: DC
Start: 2020-08-03 — End: 2020-08-03

## 2020-08-03 MED ORDER — DEXTROSE 5% IN NORMAL SALINE IV
INTRAVENOUS | Status: DC
Start: 2020-08-03 — End: 2020-08-05
  Administered 2020-08-04 – 2020-08-05 (×4): via INTRAVENOUS

## 2020-08-03 MED ORDER — DULOXETINE 30 MG CAP, DELAYED RELEASE
30 mg | Freq: Every day | ORAL | Status: DC
Start: 2020-08-03 — End: 2020-08-05
  Administered 2020-08-04 – 2020-08-05 (×2): via ORAL

## 2020-08-03 MED ORDER — AMLODIPINE 5 MG TAB
5 mg | Freq: Every day | ORAL | Status: DC
Start: 2020-08-03 — End: 2020-08-05
  Administered 2020-08-04 – 2020-08-05 (×2): via ORAL

## 2020-08-03 MED ORDER — SUCRALFATE 1 GRAM TAB
1 gram | Freq: Four times a day (QID) | ORAL | Status: DC
Start: 2020-08-03 — End: 2020-08-05
  Administered 2020-08-04 – 2020-08-05 (×6): via ORAL

## 2020-08-03 MED ORDER — SODIUM CHLORIDE 0.9 % IJ SYRG
Freq: Three times a day (TID) | INTRAMUSCULAR | Status: DC
Start: 2020-08-03 — End: 2020-08-05
  Administered 2020-08-04 – 2020-08-05 (×5): via INTRAVENOUS

## 2020-08-03 MED ORDER — ACETAMINOPHEN 325 MG TABLET
325 mg | Freq: Four times a day (QID) | ORAL | Status: DC | PRN
Start: 2020-08-03 — End: 2020-08-05
  Administered 2020-08-04 (×2): via ORAL

## 2020-08-03 MED ORDER — POLYETHYLENE GLYCOL 3350 17 GRAM (100 %) ORAL POWDER PACKET
17 gram | Freq: Every day | ORAL | Status: DC | PRN
Start: 2020-08-03 — End: 2020-08-05

## 2020-08-03 MED ORDER — THIAMINE MONONITRATE 100 MG TABLET
100 mg | Freq: Every day | ORAL | Status: DC
Start: 2020-08-03 — End: 2020-08-05
  Administered 2020-08-04 – 2020-08-05 (×2): via ORAL

## 2020-08-03 MED ORDER — PANTOPRAZOLE 20 MG TAB, DELAYED RELEASE
20 mg | Freq: Every day | ORAL | Status: DC
Start: 2020-08-03 — End: 2020-08-03

## 2020-08-03 MED ORDER — INSULIN LISPRO 100 UNIT/ML INJECTION
100 unit/mL | Freq: Four times a day (QID) | SUBCUTANEOUS | Status: DC
Start: 2020-08-03 — End: 2020-08-05
  Administered 2020-08-04 – 2020-08-05 (×4): via SUBCUTANEOUS

## 2020-08-03 MED ORDER — AMLODIPINE 5 MG TAB
5 mg | Freq: Once | ORAL | Status: AC
Start: 2020-08-03 — End: 2020-08-03
  Administered 2020-08-03: 14:00:00 via ORAL

## 2020-08-03 MED ORDER — CHLORDIAZEPOXIDE 25 MG CAP
25 mg | Freq: Once | ORAL | Status: AC
Start: 2020-08-03 — End: 2020-08-03
  Administered 2020-08-03: 15:00:00 via ORAL

## 2020-08-03 MED ORDER — LORAZEPAM 2 MG/ML IJ SOLN
2 mg/mL | Freq: Once | INTRAMUSCULAR | Status: AC
Start: 2020-08-03 — End: 2020-08-03
  Administered 2020-08-03: 14:00:00 via INTRAVENOUS

## 2020-08-03 MED ORDER — PROCHLORPERAZINE EDISYLATE 5 MG/ML INJECTION
5 mg/mL | Freq: Four times a day (QID) | INTRAMUSCULAR | Status: DC | PRN
Start: 2020-08-03 — End: 2020-08-05

## 2020-08-03 MED ORDER — SODIUM CHLORIDE 0.9% BOLUS IV
0.9 % | Freq: Once | INTRAVENOUS | Status: AC
Start: 2020-08-03 — End: 2020-08-03
  Administered 2020-08-03: 15:00:00 via INTRAVENOUS

## 2020-08-03 MED ORDER — FOLIC ACID 0.8 MG TAB
800 mcg | Freq: Every day | ORAL | Status: DC
Start: 2020-08-03 — End: 2020-08-05
  Administered 2020-08-04 – 2020-08-05 (×2): via ORAL

## 2020-08-03 MED ORDER — SODIUM CHLORIDE 0.9 % INJECTION
202 mg/2 mL | Freq: Once | INTRAMUSCULAR | Status: AC
Start: 2020-08-03 — End: 2020-08-03
  Administered 2020-08-03: 14:00:00 via INTRAVENOUS

## 2020-08-03 MED FILL — LORAZEPAM 2 MG/ML IJ SOLN: 2 mg/mL | INTRAMUSCULAR | Qty: 1

## 2020-08-03 MED FILL — SODIUM CHLORIDE 0.9 % IJ SYRG: INTRAMUSCULAR | Qty: 40

## 2020-08-03 MED FILL — PANTOPRAZOLE 40 MG TAB, DELAYED RELEASE: 40 mg | ORAL | Qty: 1

## 2020-08-03 MED FILL — DEXTROSE 5% IN NORMAL SALINE IV: INTRAVENOUS | Qty: 1000

## 2020-08-03 MED FILL — AMLODIPINE 5 MG TAB: 5 mg | ORAL | Qty: 2

## 2020-08-03 MED FILL — CHLORDIAZEPOXIDE 25 MG CAP: 25 mg | ORAL | Qty: 2

## 2020-08-03 MED FILL — LORAZEPAM 2 MG/ML IJ SOLN: 2 mg/mL | INTRAMUSCULAR | Qty: 2

## 2020-08-03 MED FILL — DEXTROSE 10% IN WATER (D10W) IV: 10 % | INTRAVENOUS | Qty: 250

## 2020-08-03 MED FILL — LORAZEPAM 2 MG/ML IJ SOLN: 2 mg/mL | INTRAMUSCULAR | Qty: 3

## 2020-08-03 MED FILL — FAMOTIDINE (PF) 20 MG/2 ML IV: 20 mg/2 mL | INTRAVENOUS | Qty: 2

## 2020-08-03 MED FILL — FOLIC ACID 1 MG TAB: 1 mg | ORAL | Qty: 1

## 2020-08-03 MED FILL — THIAMINE 100 MG/ML INJECTION: 100 mg/mL | INTRAMUSCULAR | Qty: 2

## 2020-08-03 MED FILL — SODIUM CHLORIDE 0.9 % IV: INTRAVENOUS | Qty: 1000

## 2020-08-03 NOTE — ED Provider Notes (Signed)
Antonio Melendez  EMERGENCY DEPARTMENT ENCOUNTER NOTE    Date: 08/03/2020  Patient Name: Antonio BIXLER Sr.    History of Presenting Illness     Chief Complaint   Patient presents with   ??? Alcohol Problem     HPI: Antonio Bast Sr., 60 y.o. male with a past medical history and outpatient medications as listed and reviewed below  presents for alcohol withdrawal.  For the past week, the patient has been binging alcohol every day, drinking around 12 cans of beer a day.  The trigger was the initiation of divorce with his wife.  His last drink was yesterday at 9 PM.  Since then, he did not have any alcohol.  Today he presents with shaking, diaphoresis, epigastric pain, and tremors.  He reports that he is feeling uneasy and uncomfortable.  He seems to be in alcohol withdrawal.  No history of seizures.  Denies any suicidal ideations.  Denies any current chest pain or shortness of breath.  No palpitations.  Does report epigastric pain.  No hematemesis reported.    Medical History   I reviewed the medical, surgical, family, and social history, as well as allergies:    PCP: Other, Phys, MD    Past Medical History:  Past Medical History:   Diagnosis Date   ??? Diabetes ([place])    ??? High cholesterol    ??? Hypertension      Past Surgical History:  History reviewed. No pertinent surgical history.  Current Outpatient Medications:  Current Outpatient Medications   Medication Instructions   ??? amLODIPine (NORVASC) 10 mg, Oral   ??? aspirin delayed-release 81 mg, Oral   ??? buprenorphine-naloxone (SUBOXONE) 4-1 mg film sublingual film SubLINGual   ??? cyclobenzaprine (FLEXERIL) 5 mg tablet Take 1 tablet (5 mg total) by mouth 3 (three) times daily as needed  for muscle spasms.   ??? DULoxetine (CYMBALTA) 60 mg capsule Oral   ??? ergocalciferol (ERGOCALCIFEROL) 50,000 Units, Oral   ??? fenofibrate nanocrystallized (TRICOR) 145 mg, Oral, DAILY   ??? folic acid (FOLVITE) 1 mg tablet Take 1 tablet (1 mg total) by mouth once  daily.   ??? gabapentin (NEURONTIN) 600 mg, Oral, AT BEDTIME   ??? glipiZIDE (GLUCOTROL) 10 mg, Oral   ??? insulin nph-regular human rec (NovoLIN 70-30 FlexPen U-100) 100 unit/mL (70-30) inpn Inject 48 units qAM, 28 units qhs      Family History:  History reviewed. No pertinent family history.  Social History:  Social History     Tobacco Use   ??? Smoking status: Never Smoker   ??? Smokeless tobacco: Never Used   Vaping Use   ??? Vaping Use: Never used   Substance Use Topics   ??? Alcohol use: Yes     Alcohol/week: 74.0 standard drinks     Types: 74 Cans of beer per week   ??? Drug use: Never     Allergies:  No Known Allergies    Review of Systems     Review of Systems  Negative: All other systems negative.    Physical Exam and Vital Signs   Vital Signs - Reviewed the patient's vital signs.    Patient Vitals for the past 12 hrs:   Temp Pulse Resp BP SpO2   08/03/20 1239 -- (!) 117 14 (!) 170/105 100 %   08/03/20 1205 -- (!) 125 16 (!) 168/105 100 %   08/03/20 1038 -- (!) 106 18 (!) 183/112 100 %  08/03/20 0947 97.7 ??F (36.5 ??C) (!) 118 20 (!) 177/100 100 %     Physical Exam:    GENERAL: awake, alert, cooperative, shaking, in withdrawal  HEENT:  * Pupils equal, EOMI  * Head atraumatic  CV:  * audible heart sounds  * warm and perfused extremities bilaterally  PULMONARY: Good air movement, no wheezes, no crackles  ABDOMEN/GU: soft, no distension, no guarding, noted epigastric abdominal tenderness  EXTREMITIES/BACK: warm and perfused, no tenderness, no edema  SKIN: no rashes or signs of trauma  NEURO:  * Speech clear  * Moves U&LE to command  * shaking, in withdrawal  * CIWA 13    Medical Decision Making   - I am the first and primary provider for this patient and am the primary provider of record.  - I reviewed the vital signs, available nursing notes, past medical history, past surgical history, family history and social history.  - Initial assessment performed. The patients presenting problems have been discussed, and the staff are  in agreement with the care plan formulated and outlined with them.  I have encouraged them to ask questions as they arise throughout their visit.  - Available medical records, nursing notes, old EKGs, and EMS run sheets (if patient was EMS transported) were reviewed    MDM:   Patient is a 60 y.o. male presenting for alcohol withdrawal. Vitals reveal HTN and tachycardia and physical exam reveals shaking, CIWA 13. EKG showed sinus tachycardia. Based on the history, physical exam, risk factors, and vital signs, differential includes: Alcohol withdrawal, electrolyte abnormalities, AKI, dehydration, ACS.    See ED Course and Reassessment for evaluation and discussion.    Results     Labs:  Recent Results (from the past 12 hour(s))   CBC WITH AUTOMATED DIFF    Collection Time: 08/03/20 10:05 AM   Result Value Ref Range    WBC 4.9 4.1 - 11.1 K/uL    RBC 4.69 4.10 - 5.70 M/uL    HGB 13.2 12.1 - 17.0 g/dL    HCT 40.6 36.6 - 50.3 %    MCV 86.6 80.0 - 99.0 FL    MCH 28.1 26.0 - 34.0 PG    MCHC 32.5 30.0 - 36.5 g/dL    RDW 14.6 (H) 11.5 - 14.5 %    PLATELET 199 150 - 400 K/uL    MPV 10.8 8.9 - 12.9 FL    NEUTROPHILS 66 32 - 75 %    LYMPHOCYTES 28 12 - 49 %    MONOCYTES 6 5 - 13 %    EOSINOPHILS 0 0 - 7 %    BASOPHILS 0 0 - 1 %    IMMATURE GRANULOCYTES 0 0.0 - 0.5 %    ABS. NEUTROPHILS 3.2 1.8 - 8.0 K/UL    ABS. LYMPHOCYTES 1.4 0.8 - 3.5 K/UL    ABS. MONOCYTES 0.3 0.0 - 1.0 K/UL    ABS. EOSINOPHILS 0.0 0.0 - 0.4 K/UL    ABS. BASOPHILS 0.0 0.0 - 0.1 K/UL    ABS. IMM. GRANS. 0.0 0.00 - 0.04 K/UL    DF AUTOMATED     METABOLIC PANEL, COMPREHENSIVE    Collection Time: 08/03/20 10:05 AM   Result Value Ref Range    Sodium 139 136 - 145 mmol/L    Potassium 3.9 3.5 - 5.1 mmol/L    Chloride 99 97 - 108 mmol/L    CO2 25 21 - 32 mmol/L    Anion gap 15 5 - 15  mmol/L    Glucose 88 65 - 100 mg/dL    BUN 8 6 - 20 mg/dL    Creatinine 1.00 0.70 - 1.30 mg/dL    BUN/Creatinine ratio 8 (L) 12 - 20      GFR est AA >60 >60 ml/min/1.34m    GFR est  non-AA >60 >60 ml/min/1.760m   Calcium 9.6 8.5 - 10.1 mg/dL    Bilirubin, total 0.6 0.2 - 1.0 mg/dL    AST (SGOT) 103 (H) 15 - 37 U/L    ALT (SGPT) 102 (H) 12 - 78 U/L    Alk. phosphatase 150 (H) 45 - 117 U/L    Protein, total 8.2 6.4 - 8.2 g/dL    Albumin 4.0 3.5 - 5.0 g/dL    Globulin 4.2 (H) 2.0 - 4.0 g/dL    A-G Ratio 1.0 (L) 1.1 - 2.2     LIPASE    Collection Time: 08/03/20 10:05 AM   Result Value Ref Range    Lipase 203 73 - 393 U/L   MAGNESIUM    Collection Time: 08/03/20 10:05 AM   Result Value Ref Range    Magnesium 1.6 1.6 - 2.4 mg/dL   TROPONIN-HIGH SENSITIVITY    Collection Time: 08/03/20 10:05 AM   Result Value Ref Range    Troponin-High Sensitivity 15 0 - 76 ng/L   EKG, 12 LEAD, INITIAL    Collection Time: 08/03/20 10:11 AM   Result Value Ref Range    Ventricular Rate 111 BPM    Atrial Rate 111 BPM    P-R Interval 144 ms    QRS Duration 70 ms    Q-T Interval 354 ms    QTC Calculation (Bezet) 481 ms    Calculated P Axis 67 degrees    Calculated R Axis 27 degrees    Calculated T Axis 81 degrees    Diagnosis       Sinus tachycardia with Premature supraventricular complexes  Nonspecific T wave abnormality  Abnormal ECG  When compared with ECG of 18-Feb-2007 23:35,  Premature supraventricular complexes are now Present  Nonspecific T wave abnormality now evident in Lateral leads  Confirmed by JOSEPH MD, MATHEW ([id number]) on 08/03/2020 11:03:27 AM     GLUCOSE, POC    Collection Time: 08/03/20 10:43 AM   Result Value Ref Range    Glucose (POC) 82 65 - 117 mg/dL    Performed by ROQuentin CornwallBIGAIL      Radiologic Studies:  CT Results  (Last 48 hours)    None        CXR Results  (Last 48 hours)    None        Medications ordered:  Medications   thiamine (B-1) 200 mg in 0.9% sodium chloride 50 mL IVPB (0 mg IntraVENous Transfusion Completed 08/03/20 1100)   LORazepam (ATIVAN) injection 6 mg (has no administration in time range)   sodium chloride 0.9 % bolus infusion 1,000 mL (0 mL IntraVENous Transfusion Completed 08/03/20  1111)   famotidine (PF) (PEPCID) 20 mg in 0.9% sodium chloride 10 mL injection (20 mg IntraVENous Given 08/03/20 1024)   amLODIPine (NORVASC) tablet 10 mg (10 mg Oral Given 6/8/2/5005397  folic acid (FOLVITE) tablet 1 mg (1 mg Oral Given 08/03/20 1023)   LORazepam (ATIVAN) injection 2 mg (2 mg IntraVENous Given 08/03/20 1024)   chlordiazePOXIDE (LIBRIUM) capsule 50 mg (50 mg Oral Given 08/03/20 1112)   sodium chloride 0.9 % bolus infusion 1,000 mL (0 mL IntraVENous Transfusion  Completed 08/03/20 1236)   LORazepam (ATIVAN) injection 4 mg (4 mg IntraVENous Given 08/03/20 1202)     ED Course and Reassessment     ED Course:     ED Course as of 08/03/20 1251   Wed Aug 03, 2020   1048 CBC does not show any evidence of acute process. Leukocytosis not present to suggest infection. Hemoglobin not suggestive of acute anemia.     No significant electrolyte derangements. Creatinine is not elevated more than baseline range making AKI unlikely. No significant transaminitis noted. Normal bilirubin.    Lipase is not significantly elevated.    Magnesium within normal limits.    Trop 15, will trend.    POC G normal. [SS]   9563 Patient has significant improvement of his symptoms.  Repeat abdominal exam is normal.  Heart rate is improving.  Patient reports that he does not want to drink alcohol.  Will attempt to give Librium and discharged on Librium for symptomatic treatment after improvement of vital signs. [SS]   1158 Patient remains tachycardic at 120.  Shaking has resumed despite 2 of Ativan and Librium.  Will give 4 of Ativan IV. [SS]   8756 Remains shaky.  Heart rate 120s.  Blood pressure elevated.  Will give 6 mg of Ativan. [SS]      ED Course User Index  [SS] Ranelle Oyster, MD       Reassessment:    Will need admission.    Final Disposition     ADMITTED TO HOSPITAL    After completion of ED workup and discussion of results and diagnoses, patient was admitted to the hospital. Case was discussed with admitting provider.    ED Procedures    Performed by: Ranelle Oyster, MD  Procedures     EKG interpretation (Preliminary):  Rhythm: normal sinus rhythm; and tachycardic . Rate (approx.): 111.  Axis: normal;  PR interval: normal;  QRS interval: normal ;  ST/T wave: normal;  Other findings: abnormal EKG: Sinus tachycardia.    CRITICAL CARE NOTE :  12:50 PM    Critical care time is being documented due to the fulfillment of at least one of the following:    - Critical conditions: condition that acutely impairs one or more vital organ systems such that there is a high probability of imminent or life-threatening deterioration in condition. Examples are diagnoses including but not limited to Afib RVR, DKA, PE, Etc..    - Critical interventions: an action whose failure to initiate would likely allow a sudden, clinically significant decline in the patient's condition. These include  ??? Requirement of transfer or ICU admission  ??? Contemplation or provision of tPA  ??? Drip initiation (pressors, antiarrhythmics, heparin, etc.)  ??? Antidotes given (narcan, charcoal, epi for anaphylaxis, etc..)  ??? >=2L fluid bolus  ??? >=3 Duonebs  ??? >1 IV/IM doses of sedatives, antiepileptics, BP meds, rate control meds, adenosine.  ??? Procedures that are suggestive of critical care: chest tubes, cardioversion, BiPAP, IO, etc..    Critical care time is documented based on continuous or non-continuous provision of care that includes face-to-face time, placing orders, chart review, documentation, discussion with consultants, discussion with family. This time calculation is a best approximation and does not include time spent on CPR, EKG interpretation, central line placement, intubation, laceration repairs, and other separately billed procedures.    Amount of Critical Care Time: 71mnutes    Details of critical care provision is documented above. A general summary is listed below:  IMPENDING DETERIORATION - Metabolic  ASSOCIATED RISK FACTORS - Metabolic Changes  MANAGEMENT- Bedside  Assessment  INTERPRETATION -  ECG and Blood Pressure  INTERVENTIONS - Hemodynamic and Metabolic interventions  CASE REVIEW - Hospitalist  TREATMENT RESPONSE - Stable  PERFORMED BY - Self    NOTES   :  During this entire length of time I was immediately available to the patient .    Jazminn Pomales Naoma Diener, MD      Diagnosis     Clinical Impression:   1. Acute alcoholic gastritis without hemorrhage    2. Alcohol withdrawal syndrome without complication [facility])      Attestations:    Ranelle Oyster, MD    Please note that this dictation was completed with Dragon, the computer voice recognition software.  Quite often unanticipated grammatical, syntax, homophones, and other interpretive errors are inadvertently transcribed by the computer software.  Please disregard these errors.  Please excuse any errors that have escaped final proofreading.  Thank you.

## 2020-08-03 NOTE — ED Notes (Signed)
Pt states is diabetic and has been drinking about 12 can a beer a day for two days. States has head and abdominal pain.

## 2020-08-03 NOTE — H&P (Signed)
History and Physical    Patient: Antonio HARCUM Sr. MRN: [id number]  SSN: [id number]    Date of Birth: 01-Jul-1960  Age: 60 y.o.  Sex: male      Subjective:      Antonio Melendez. is a 60 y.o. male with a history of diabetes, hypertension, and alcohol abuse who presented for alcohol withdrawal.  For the past week, the patient has been binging alcohol every day, drinking around 12 cans of beer a day.  The trigger was the initiation of divorce with his wife. His friend found him asleep, drenched in sweat,and difficult to arouse. He denies tobacco use and illicit drug use. He reports shaking and epigastric pain secondary to vomiting. .      Past Medical History:   Diagnosis Date   ??? Diabetes ([place])    ??? High cholesterol    ??? Hypertension      History reviewed. No pertinent surgical history.   History reviewed. No pertinent family history.  Social History     Tobacco Use   ??? Smoking status: Never Smoker   ??? Smokeless tobacco: Never Used   Substance Use Topics   ??? Alcohol use: Yes     Alcohol/week: 74.0 standard drinks     Types: 74 Cans of beer per week      Prior to Admission medications    Medication Sig Start Date End Date Taking? Authorizing Provider   amLODIPine (NORVASC) 10 mg tablet Take 10 mg by mouth. 11/25/19   Other, Phys, MD   aspirin delayed-release 81 mg tablet Take 81 mg by mouth.    Other, Phys, MD   buprenorphine-naloxone (SUBOXONE) 4-1 mg film sublingual film by SubLINGual route. 05/17/20   Other, Phys, MD   glipiZIDE (GLUCOTROL) 10 mg tablet Take 10 mg by mouth. 11/25/19   Other, Phys, MD   gabapentin (NEURONTIN) 600 mg tablet Take 600 mg by mouth At bedtime. 11/03/19 12/10/20  Other, Phys, MD   folic acid (FOLVITE) 1 mg tablet Take 1 tablet (1 mg total) by mouth once daily. 01/14/20   Other, Phys, MD   fenofibrate nanocrystallized (TRICOR) 145 mg tablet Take 145 mg by mouth daily. 07/02/20 07/03/21  Other, Phys, MD   ergocalciferol (ERGOCALCIFEROL) 1,250 mcg (50,000 unit) capsule Take 50,000 Units by mouth.  11/04/19   Other, Phys, MD   DULoxetine (CYMBALTA) 60 mg capsule Take  by mouth. 01/29/20   Other, Phys, MD   cyclobenzaprine (FLEXERIL) 5 mg tablet Take 1 tablet (5 mg total) by mouth 3 (three) times daily as needed  for muscle spasms. 10/31/19   Other, Phys, MD   insulin nph-regular human rec (NovoLIN 70-30 FlexPen U-100) 100 unit/mL (70-30) inpn Inject 48 units qAM, 28 units qhs 08/02/20   Staci Acosta D, DO        No Known Allergies    Review of Systems:  Review of Systems   Constitutional: Positive for diaphoresis. Negative for chills, fever and weight loss.   HENT: Negative.    Eyes: Negative.    Respiratory: Negative.    Cardiovascular: Negative.    Gastrointestinal: Negative.    Genitourinary: Negative.    Musculoskeletal: Negative.    Neurological: Positive for weakness.   Psychiatric/Behavioral: Positive for substance abuse.        Objective:     Recent Results (from the past 24 hour(s))   CBC WITH AUTOMATED DIFF    Collection Time: 08/03/20 10:05 AM   Result Value Ref Range  WBC 4.9 4.1 - 11.1 K/uL    RBC 4.69 4.10 - 5.70 M/uL    HGB 13.2 12.1 - 17.0 g/dL    HCT 40.6 36.6 - 50.3 %    MCV 86.6 80.0 - 99.0 FL    MCH 28.1 26.0 - 34.0 PG    MCHC 32.5 30.0 - 36.5 g/dL    RDW 14.6 (H) 11.5 - 14.5 %    PLATELET 199 150 - 400 K/uL    MPV 10.8 8.9 - 12.9 FL    NEUTROPHILS 66 32 - 75 %    LYMPHOCYTES 28 12 - 49 %    MONOCYTES 6 5 - 13 %    EOSINOPHILS 0 0 - 7 %    BASOPHILS 0 0 - 1 %    IMMATURE GRANULOCYTES 0 0.0 - 0.5 %    ABS. NEUTROPHILS 3.2 1.8 - 8.0 K/UL    ABS. LYMPHOCYTES 1.4 0.8 - 3.5 K/UL    ABS. MONOCYTES 0.3 0.0 - 1.0 K/UL    ABS. EOSINOPHILS 0.0 0.0 - 0.4 K/UL    ABS. BASOPHILS 0.0 0.0 - 0.1 K/UL    ABS. IMM. GRANS. 0.0 0.00 - 0.04 K/UL    DF AUTOMATED     METABOLIC PANEL, COMPREHENSIVE    Collection Time: 08/03/20 10:05 AM   Result Value Ref Range    Sodium 139 136 - 145 mmol/L    Potassium 3.9 3.5 - 5.1 mmol/L    Chloride 99 97 - 108 mmol/L    CO2 25 21 - 32 mmol/L    Anion gap 15 5 - 15 mmol/L     Glucose 88 65 - 100 mg/dL    BUN 8 6 - 20 mg/dL    Creatinine 1.00 0.70 - 1.30 mg/dL    BUN/Creatinine ratio 8 (L) 12 - 20      GFR est AA >60 >60 ml/min/1.77m    GFR est non-AA >60 >60 ml/min/1.756m   Calcium 9.6 8.5 - 10.1 mg/dL    Bilirubin, total 0.6 0.2 - 1.0 mg/dL    AST (SGOT) 103 (H) 15 - 37 U/L    ALT (SGPT) 102 (H) 12 - 78 U/L    Alk. phosphatase 150 (H) 45 - 117 U/L    Protein, total 8.2 6.4 - 8.2 g/dL    Albumin 4.0 3.5 - 5.0 g/dL    Globulin 4.2 (H) 2.0 - 4.0 g/dL    A-G Ratio 1.0 (L) 1.1 - 2.2     LIPASE    Collection Time: 08/03/20 10:05 AM   Result Value Ref Range    Lipase 203 73 - 393 U/L   MAGNESIUM    Collection Time: 08/03/20 10:05 AM   Result Value Ref Range    Magnesium 1.6 1.6 - 2.4 mg/dL   TROPONIN-HIGH SENSITIVITY    Collection Time: 08/03/20 10:05 AM   Result Value Ref Range    Troponin-High Sensitivity 15 0 - 76 ng/L   EKG, 12 LEAD, INITIAL    Collection Time: 08/03/20 10:11 AM   Result Value Ref Range    Ventricular Rate 111 BPM    Atrial Rate 111 BPM    P-R Interval 144 ms    QRS Duration 70 ms    Q-T Interval 354 ms    QTC Calculation (Bezet) 481 ms    Calculated P Axis 67 degrees    Calculated R Axis 27 degrees    Calculated T Axis 81 degrees    Diagnosis  Sinus tachycardia with Premature supraventricular complexes  Nonspecific T wave abnormality  Abnormal ECG  When compared with ECG of 18-Feb-2007 23:35,  Premature supraventricular complexes are now Present  Nonspecific T wave abnormality now evident in Lateral leads  Confirmed by JOSEPH MD, MATHEW ([id number]) on 08/03/2020 11:03:27 AM     GLUCOSE, POC    Collection Time: 08/03/20 10:43 AM   Result Value Ref Range    Glucose (POC) 82 65 - 117 mg/dL    Performed by Quentin Cornwall ABIGAIL         No orders to display        Vitals:    08/03/20 1205 08/03/20 1239 08/03/20 1406 08/03/20 1545   BP: (!) 168/105 (!) 170/105 (!) 157/102 (!) 161/98   Pulse: (!) 125 (!) 117 (!) 120 (!) 114   Resp: '16 14 14 18   ' Temp:    98.1 ??F (36.7 ??C)   SpO2:  100% 100% 100% 99%   Weight:       Height:            Physical Exam:  Physical Exam  Vitals reviewed.   Constitutional:       General: He is not in acute distress.     Appearance: He is not diaphoretic.   HENT:      Head: Normocephalic and atraumatic.      Nose: Nose normal.   Eyes:      Extraocular Movements: Extraocular movements intact.      Conjunctiva/sclera: Conjunctivae normal.   Cardiovascular:      Rate and Rhythm: Regular rhythm. Tachycardia present.      Pulses: Normal pulses.      Heart sounds: Normal heart sounds.   Pulmonary:      Effort: Pulmonary effort is normal.      Breath sounds: Normal breath sounds.   Abdominal:      General: Bowel sounds are normal.      Palpations: Abdomen is soft.      Tenderness: There is abdominal tenderness.      Comments: Tenderness to palpation in epigastric region   Musculoskeletal:      Right lower leg: No edema.      Left lower leg: No edema.   Skin:     General: Skin is warm and dry.   Neurological:      Comments: obtunded          Assessment:       1. Alcohol withdrawal  Ciwa  Librium  Thiamine  Folate  May require ICU if patient's overall symptoms do not improve with conservative therapy    2.  Elevated transaminases  Most likely from alcohol abuse  Continue to monitor closely  No signs of liver failure as bilirubin is normal can check coags if necessary  Repeat CMP daily    3.  Essential hypertension  Amlodipine  Metoprolol  Ramipril or equivalent    4. Diabetes mellitus  NPH 48 units in the morning and 28 units at night home dose will do lantus 30 units while not eating normal diet  SSI    5. HLD  Rosuvastatin on hold due to elevated transaminases    6. GERD with alcoholic gastritis  Omeprazole    DVT PPX: CDs  Ulcer PPX: omeprazole    Hospital Problems  Never Reviewed          Codes Class Noted POA    Withdrawal symptoms, alcohol ([place]) ICD-10-CM: N02.725  ICD-9-CM: 291.81  08/03/2020 Unknown  Plan:     1. Alcohol  withdrawal  Ciwa  Librium  Thiamine  Folate  May require ICU if patient's overall symptoms do not improve with conservative therapy    2.  Elevated transaminases  Most likely from alcohol abuse  Continue to monitor closely  No signs of liver failure as bilirubin is normal can check coags if necessary  Repeat CMP daily    3.  Essential hypertension  Amlodipine  Metoprolol  Ramipril or equivalent    4. Diabetes mellitus  NPH 48 units in the morning and 28 units at night  SSI    5. HLD  Rosuvastatin on hold due to elevated transaminases    6. GERD with alcoholic gastritis  Omeprazole    DVT PPX: CDs  Ulcer PPX: omeprazole    Time of admission 55 minutes    Signed By: Laqueta Carina     August 03, 2020

## 2020-08-03 NOTE — ED Notes (Signed)
Report called to Trula Ore, RN on 4E. Pt notified of room assignment and has cell phone in hands. [facility] ETA 1430 for ALS transport.

## 2020-08-04 LAB — CBC WITH AUTOMATED DIFF
ABS. BASOPHILS: 0 10*3/uL (ref 0.0–0.1)
ABS. EOSINOPHILS: 0.1 10*3/uL (ref 0.0–0.4)
ABS. IMM. GRANS.: 0 10*3/uL (ref 0.00–0.04)
ABS. LYMPHOCYTES: 1.8 10*3/uL (ref 0.8–3.5)
ABS. MONOCYTES: 0.3 10*3/uL (ref 0.0–1.0)
ABS. NEUTROPHILS: 3.3 10*3/uL (ref 1.8–8.0)
ABSOLUTE NRBC: 0 10*3/uL (ref 0.00–0.01)
BASOPHILS: 0 % (ref 0–1)
EOSINOPHILS: 1 % (ref 0–7)
HCT: 41.9 % (ref 36.6–50.3)
HGB: 13.7 g/dL (ref 12.1–17.0)
IMMATURE GRANULOCYTES: 0 % (ref 0–0.5)
LYMPHOCYTES: 33 % (ref 12–49)
MCH: 28.1 pg (ref 26.0–34.0)
MCHC: 32.7 g/dL (ref 30.0–36.5)
MCV: 85.9 fL (ref 80.0–99.0)
MONOCYTES: 6 % (ref 5–13)
MPV: 10.9 fL (ref 8.9–12.9)
NEUTROPHILS: 60 % (ref 32–75)
NRBC: 0 /100{WBCs}
PLATELET: 167 10*3/uL (ref 150–400)
RBC: 4.88 M/uL (ref 4.10–5.70)
RDW: 14.6 % — ABNORMAL HIGH (ref 11.5–14.5)
WBC: 5.6 10*3/uL (ref 4.1–11.1)

## 2020-08-04 LAB — GLUCOSE, POC
Glucose (POC): 197 mg/dL — ABNORMAL HIGH (ref 65–117)
Glucose (POC): 97 mg/dL (ref 65–117)
Glucose (POC): 197 mg/dL — ABNORMAL HIGH (ref 65–117)
Glucose (POC): 227 mg/dL — ABNORMAL HIGH (ref 65–117)

## 2020-08-04 LAB — METABOLIC PANEL, COMPREHENSIVE
A-G Ratio: 0.8 — ABNORMAL LOW (ref 1.1–2.2)
ALT (SGPT): 57 U/L (ref 12–78)
AST (SGOT): 51 U/L — ABNORMAL HIGH (ref 15–37)
Albumin: 3.1 g/dL — ABNORMAL LOW (ref 3.5–5.0)
Alk. phosphatase: 111 U/L (ref 45–117)
Anion gap: 6 mmol/L (ref 5–15)
BUN/Creatinine ratio: 8 — ABNORMAL LOW (ref 12–20)
BUN: 7 mg/dL (ref 6–20)
Bilirubin, total: 0.8 mg/dL (ref 0.2–1.0)
CO2: 27 mmol/L (ref 21–32)
Calcium: 8.7 mg/dL (ref 8.5–10.1)
Chloride: 106 mmol/L (ref 97–108)
Creatinine: 0.88 mg/dL (ref 0.70–1.30)
GFR est AA: >60 mL/min/{1.73_m2} (ref >60)
GFR est non-AA: >60 mL/min/{1.73_m2} (ref >60)
Globulin: 3.9 g/dL (ref 2.0–4.0)
Glucose: 109 mg/dL — ABNORMAL HIGH (ref 65–100)
Potassium: 3.3 mmol/L — ABNORMAL LOW (ref 3.5–5.1)
Protein, total: 7 g/dL (ref 6.4–8.2)
Sodium: 139 mmol/L (ref 136–145)

## 2020-08-04 LAB — HEMOGLOBIN A1C WITH EAG
Est. average glucose: 286 mg/dL
Hemoglobin A1c: 11.6 % — ABNORMAL HIGH (ref 4.0–5.6)

## 2020-08-04 LAB — CBC WITH AUTO DIFFERENTIAL
Basophils %: 0 % (ref 0–1)
Basophils Absolute: 0 10*3/uL (ref 0.0–0.1)
Eosinophils %: 1 % (ref 0–7)
Eosinophils Absolute: 0.1 10*3/uL (ref 0.0–0.4)
Granulocyte Absolute Count: 0 10*3/uL (ref 0.00–0.04)
Hematocrit: 41.9 % (ref 36.6–50.3)
Hemoglobin: 13.7 g/dL (ref 12.1–17.0)
Immature Granulocytes: 0 % (ref 0–0.5)
Lymphocytes %: 33 % (ref 12–49)
Lymphocytes Absolute: 1.8 10*3/uL (ref 0.8–3.5)
MCH: 28.1 PG (ref 26.0–34.0)
MCHC: 32.7 g/dL (ref 30.0–36.5)
MCV: 85.9 FL (ref 80.0–99.0)
MPV: 10.9 FL (ref 8.9–12.9)
Monocytes %: 6 % (ref 5–13)
Monocytes Absolute: 0.3 10*3/uL (ref 0.0–1.0)
NRBC Absolute: 0 10*3/uL (ref 0.00–0.01)
Neutrophils %: 60 % (ref 32–75)
Neutrophils Absolute: 3.3 10*3/uL (ref 1.8–8.0)
Nucleated RBCs: 0 PER 100 WBC
Platelets: 167 10*3/uL (ref 150–400)
RBC: 4.88 M/uL (ref 4.10–5.70)
RDW: 14.6 % — ABNORMAL HIGH (ref 11.5–14.5)
WBC: 5.6 10*3/uL (ref 4.1–11.1)

## 2020-08-04 LAB — COMPREHENSIVE METABOLIC PANEL
ALT: 57 U/L (ref 12–78)
AST: 51 U/L — ABNORMAL HIGH (ref 15–37)
Albumin/Globulin Ratio: 0.8 — ABNORMAL LOW (ref 1.1–2.2)
Albumin: 3.1 g/dL — ABNORMAL LOW (ref 3.5–5.0)
Alkaline Phosphatase: 111 U/L (ref 45–117)
Anion Gap: 6 mmol/L (ref 5–15)
BUN: 7 mg/dL (ref 6–20)
Bun/Cre Ratio: 8 — ABNORMAL LOW (ref 12–20)
CO2: 27 mmol/L (ref 21–32)
Calcium: 8.7 mg/dL (ref 8.5–10.1)
Chloride: 106 mmol/L (ref 97–108)
Creatinine: 0.88 mg/dL (ref 0.70–1.30)
EGFR IF NonAfrican American: >60 mL/min/{1.73_m2} (ref >60)
GFR African American: >60 mL/min/{1.73_m2} (ref >60)
Globulin: 3.9 g/dL (ref 2.0–4.0)
Glucose: 109 mg/dL — ABNORMAL HIGH (ref 65–100)
Potassium: 3.3 mmol/L — ABNORMAL LOW (ref 3.5–5.1)
Sodium: 139 mmol/L (ref 136–145)
Total Bilirubin: 0.8 mg/dL (ref 0.2–1.0)
Total Protein: 7 g/dL (ref 6.4–8.2)

## 2020-08-04 LAB — HEMOGLOBIN A1C W/EAG
Hemoglobin A1C: 11.6 % — ABNORMAL HIGH (ref 4.0–5.6)
eAG: 286 mg/dL

## 2020-08-04 LAB — POCT GLUCOSE
POC Glucose: 197 mg/dL — ABNORMAL HIGH (ref 65–117)
POC Glucose: 97 mg/dL (ref 65–117)
POC Glucose: 197 mg/dL — ABNORMAL HIGH (ref 65–117)
POC Glucose: 227 mg/dL — ABNORMAL HIGH (ref 65–117)

## 2020-08-04 MED ORDER — POTASSIUM CHLORIDE SR 20 MEQ TAB, PARTICLES/CRYSTALS
20 mEq | Freq: Two times a day (BID) | ORAL | Status: DC
Start: 2020-08-04 — End: 2020-08-05
  Administered 2020-08-04 – 2020-08-05 (×3): via ORAL

## 2020-08-04 MED FILL — CHLORDIAZEPOXIDE 10 MG CAP: 10 mg | ORAL | Qty: 1

## 2020-08-04 MED FILL — PANTOPRAZOLE 40 MG TAB, DELAYED RELEASE: 40 mg | ORAL | Qty: 1

## 2020-08-04 MED FILL — DULOXETINE 30 MG CAP, DELAYED RELEASE: 30 mg | ORAL | Qty: 2

## 2020-08-04 MED FILL — POTASSIUM CHLORIDE SR 20 MEQ TAB, PARTICLES/CRYSTALS: 20 mEq | ORAL | Qty: 2

## 2020-08-04 MED FILL — METOPROLOL TARTRATE 25 MG TAB: 25 mg | ORAL | Qty: 1

## 2020-08-04 MED FILL — LANTUS U-100 INSULIN 100 UNIT/ML SUBCUTANEOUS SOLUTION: 100 unit/mL | SUBCUTANEOUS | Qty: 30

## 2020-08-04 MED FILL — AMLODIPINE 5 MG TAB: 5 mg | ORAL | Qty: 2

## 2020-08-04 MED FILL — SUCRALFATE 1 GRAM TAB: 1 gram | ORAL | Qty: 1

## 2020-08-04 MED FILL — TYLENOL 325 MG TABLET: 325 mg | ORAL | Qty: 2

## 2020-08-04 MED FILL — LORAZEPAM 2 MG/ML IJ SOLN: 2 mg/mL | INTRAMUSCULAR | Qty: 1

## 2020-08-04 MED FILL — LISINOPRIL 20 MG TAB: 20 mg | ORAL | Qty: 1

## 2020-08-04 MED FILL — THIAMINE MONONITRATE 100 MG TABLET: 100 mg | ORAL | Qty: 1

## 2020-08-04 MED FILL — FOLIC ACID 0.8 MG TAB: 800 mcg | ORAL | Qty: 1

## 2020-08-04 MED FILL — HUMALOG U-100 INSULIN 100 UNIT/ML SUBCUTANEOUS SOLUTION: 100 unit/mL | SUBCUTANEOUS | Qty: 3

## 2020-08-04 MED FILL — HUMALOG U-100 INSULIN 100 UNIT/ML SUBCUTANEOUS SOLUTION: 100 unit/mL | SUBCUTANEOUS | Qty: 4

## 2020-08-04 NOTE — Progress Notes (Signed)
 Problem: Falls - Risk of  Goal: *Absence of Falls  Description: Document Antonio Melendez Fall Risk and appropriate interventions in the flowsheet.  Outcome: Progressing Towards Goal  Note: Fall Risk Interventions:  Mobility Interventions: Bed/chair exit alarm, Patient to call before getting OOB         Medication Interventions: Bed/chair exit alarm, Patient to call before getting OOB

## 2020-08-04 NOTE — Progress Notes (Signed)
 Problem: Mobility Impaired (Adult and Pediatric)  Goal: *Acute Goals and Plan of Care (Insert Text)  Description: I with LE HEP x7 days  Mod I with bed mob x 7 days  Mod I with all transfers x7 days  Amb 75-143ft with LRAD and sBAx1 x7 days without LOB    Patient stated goal: I want to go home  08/04/2020 1447 by Ann Harlene ORN  Outcome: Not Met       PHYSICAL THERAPY EVALUATION  Patient: Antonio ANCRUM Sr. (60 y.o. male)  Date: 08/04/2020  Primary Diagnosis: Withdrawal symptoms, alcohol (HCC) [F10.239]  Alcohol withdrawal (HCC) [F10.239]        Precautions:        ASSESSMENT    59yo M admitted to hospital with ETOH withdrawal symptoms presents to PT with decreased bed mob, transfers, LE strength, gt, balance, activity tolerance, and overall functional mobility. PMH listed below.  Pt lives with friend in 1 story home with 2 STE home with R rail.  PTA pt reports I with ADLs and amb without AD. Currently, pt is supine in bed upon PT arrival, alert and oriented x4. Pt SBA with supine to sit EOB, CGA for sit to stand transfers. Pt able to amb approx 39ft with RW and CGAx1.  No LOB during session, pt may progress to not needing walker by next 1-2 visits.  He was slightly unsteady during session today and stated RW provided more support.  Pt CGA for standing to sit in chair transfer post amb. Pt may benefit from skilled PT to address his functional deficits.  Recommend HHPT upon d/c at this time.       PLAN :  Recommendations and Planned Interventions: bed mobility training, transfer training, gait training, therapeutic exercises, patient and family training/education, and therapeutic activities      Frequency/Duration: Patient will be followed by physical therapy:  5 times a week to address goals.    Recommendation for discharge: (in order for the patient to meet his/her long term goals)  Home with Home Health Therapy    IF patient discharges home will need the following DME: rolling walker ??         SUBJECTIVE:    Patient supine in bed upon PT arrival, agreeable to work with PT    OBJECTIVE DATA SUMMARY:   HISTORY:    Past Medical History:   Diagnosis Date    Diabetes (HCC)     High cholesterol     Hypertension    History reviewed. No pertinent surgical history.    Personal factors and/or comorbidities impacting plan of care:     Home Situation  Home Environment: Private residence  # Steps to Enter: 2  Rails to Enter: Yes  Hand Rails : Right  One/Two Story Residence: One story  Living Alone: No  Support Systems: Friend/Neighbor  Patient Expects to be Discharged to:: Home with home health        EXAMINATION/PRESENTATION/DECISION MAKING:   Critical Behavior:  Neurologic State: Alert  Orientation Level: Oriented X4  Cognition: Follows commands           Range Of Motion:  AROM: Within functional limits     B LE    Strength:    Strength: Generally decreased, functional  Grossly 4/5 B LE    Tone & Sensation:   Intact to LT B LE      Functional Mobility:  Bed Mobility:     Supine to Sit: Stand-by assistance  Scooting: Stand-by assistance  Transfers:  Sit to Stand: Contact guard assistance  Stand to Sit: Contact guard assistance  Stand Pivot Transfers: Contact guard assistance                    Balance:   Sitting: Intact  Standing: With support  Ambulation/Gait Training:  Distance (ft): 50 Feet (ft)  Assistive Device: Walker, rolling  Ambulation - Level of Assistance: Contact guard assistance       Speed/Cadence: Slow         Functional Measure:  [employer] AM-PACT "6 Clicks"         Basic Mobility Inpatient Short Form  How much difficulty does the patient currently have... Unable A Lot A Little None   1.  Turning over in bed (including adjusting bedclothes, sheets and blankets)?   []  1   []  2   []  3   [x]  4   2.  Sitting down on and standing up from a chair with arms ( e.g., wheelchair, bedside commode, etc.)   []  1   []  2   [x]  3   []  4   3.  Moving from lying on back to sitting on the side of the bed?   []  1   []  2    []  3   [x]  4          How much help from another person does the patient currently need... Total A Lot A Little None   4.  Moving to and from a bed to a chair (including a wheelchair)?   []  1   []  2   [x]  3   []  4   5.  Need to walk in hospital room?   []  1   []  2   [x]  3   []  4   6.  Climbing 3-5 steps with a railing?   []  1   []  2   [x]  3   []  4    2007, Trustees of [address], under license to [employer], [place]. All rights reserved     Score:  Initial: 20 Most Recent: X (Date: -- )   Interpretation of Tool:  Represents activities that are increasingly more difficult (i.e. Bed mobility, Transfers, Gait).  Score 24 23 22-20 19-15 14-10 9-7 6   Modifier CH CI CJ CK CL CM CN           Physical Therapy Evaluation Charge Determination   History Examination Presentation Decision-Making   MEDIUM  Complexity : 1-2 comorbidities / personal factors will impact the outcome/ POC  MEDIUM Complexity : 3 Standardized tests and measures addressing body structure, function, activity limitation and / or participation in recreation  LOW Complexity : Stable, uncomplicated  Other Functional Measure AMPAC 6 LOW      Based on the above components, the patient evaluation is determined to be of the following complexity level: LOW     Pain Rating:  No c/o pain during session today    Activity Tolerance:   Good      After treatment patient left in no apparent distress:   Sitting in chair, Call bell within reach, and Caregiver / family present          COMMUNICATION/EDUCATION:   The patient's plan of care was discussed with: Case management.     Patient/family agree to work toward stated goals and plan of care.      Thank you for this referral.  Harlene LELON Silversmith  Time Calculation: 20 mins

## 2020-08-04 NOTE — Progress Notes (Signed)
 Reason for Admission:  Alcohol withdrawal                     RUR Score:          8%           Plan for utilizing home health:      Declined     PCP: First and Last name:  Other, Phys, MD     Name of Practice:    Are you a current patient: Yes/No:    Approximate date of last visit:    Can you participate in a virtual visit with your PCP:                     Current Advanced Directive/Advance Care Plan: Full Code      Healthcare Decision Maker:   Click here to complete HealthCare Decision Makers including selection of the Healthcare Decision Maker Relationship (ie Primary)                             Transition of Care Plan:                      CM met with patient at bedside to complete DCP assessment. Patient lives with a friend in a single story home with 2 steps to enter and egress. He ambulates independently and drives himself to medical and non medical appointments. PLOF: independent in all ADLs. His home pharmacy is [facility] on [address].  Home health services were declined at this time. Current discharge dispo: home self care. CM team will continue to follow.

## 2020-08-04 NOTE — Progress Notes (Signed)
Hospitalist Progress Note    Subjective:   Daily Progress Note: 08/04/2020 8:53 AM    Alcohol withdrawal symptoms have improved.  Mentation has improved.    Current Facility-Administered Medications   Medication Dose Route Frequency   ??? potassium chloride (K-DUR, KLOR-CON M20) SR tablet 40 mEq  40 mEq Oral BID   ??? sodium chloride (NS) flush 5-40 mL  5-40 mL IntraVENous Q8H   ??? sodium chloride (NS) flush 5-40 mL  5-40 mL IntraVENous PRN   ??? acetaminophen (TYLENOL) tablet 650 mg  650 mg Oral Q6H PRN    Or   ??? acetaminophen (TYLENOL) suppository 650 mg  650 mg Rectal Q6H PRN   ??? polyethylene glycol (MIRALAX) packet 17 g  17 g Oral DAILY PRN   ??? ondansetron (ZOFRAN ODT) tablet 4 mg  4 mg Oral Q8H PRN    Or   ??? ondansetron (ZOFRAN) injection 4 mg  4 mg IntraVENous Q6H PRN   ??? chlordiazePOXIDE (LIBRIUM) capsule 10 mg  10 mg Oral TID   ??? amLODIPine (NORVASC) tablet 10 mg  10 mg Oral DAILY   ??? DULoxetine (CYMBALTA) capsule 60 mg  60 mg Oral DAILY   ??? metoprolol tartrate (LOPRESSOR) tablet 25 mg  25 mg Oral BID   ??? lisinopriL (PRINIVIL, ZESTRIL) tablet 20 mg  20 mg Oral DAILY   ??? thiamine mononitrate (B-1) tablet 100 mg  100 mg Oral DAILY   ??? folic acid (FOLVITE) tablet 0.8 mg  0.8 mg Oral DAILY   ??? LORazepam (ATIVAN) injection 2 mg  2 mg IntraVENous Q1H PRN   ??? dextrose 5% and 0.9% NaCl infusion  100 mL/hr IntraVENous CONTINUOUS   ??? prochlorperazine (COMPAZINE) injection 10 mg  10 mg IntraMUSCular Q6H PRN   ??? glucose chewable tablet 16 g  4 Tablet Oral PRN   ??? dextrose 10% infusion 0-250 mL  0-250 mL IntraVENous PRN   ??? glucagon (GLUCAGEN) injection 1 mg  1 mg IntraMUSCular PRN   ??? insulin glargine (LANTUS) injection 30 Units  30 Units SubCUTAneous QHS   ??? insulin lispro (HUMALOG) injection   SubCUTAneous AC&HS   ??? pantoprazole (PROTONIX) tablet 40 mg  40 mg Oral ACB&D   ??? sucralfate (CARAFATE) tablet 1 g  1 g Oral AC&HS        Review of Systems  Review of Systems   Constitutional: Positive for malaise/fatigue.   HENT:  Negative.    Respiratory: Negative for cough.    Cardiovascular: Negative for chest pain and leg swelling.   Gastrointestinal: Positive for abdominal pain. Negative for nausea and vomiting.   Genitourinary: Negative.    Musculoskeletal: Negative.    Skin: Negative.    Neurological: Positive for weakness.   Psychiatric/Behavioral: Positive for substance abuse.            Objective:     Visit Vitals  BP (!) 149/98 (BP 1 Location: Left upper arm, BP Patient Position: At rest;Lying)   Pulse 98   Temp 97.8 ??F (36.6 ??C)   Resp 18   Ht '5\' 9"'  (1.753 m)   Wt 87.1 kg (192 lb)   SpO2 98%   BMI 28.35 kg/m??      O2 Device: None (Room air)    Temp (24hrs), Avg:98 ??F (36.7 ??C), Min:97.7 ??F (36.5 ??C), Max:98.4 ??F (36.9 ??C)      No intake/output data recorded.  06/07 1901 - 06/09 0700  In: -   Out: 2225 [Urine:2225]    Recent Results (  from the past 24 hour(s))   CBC WITH AUTOMATED DIFF    Collection Time: 08/03/20 10:05 AM   Result Value Ref Range    WBC 4.9 4.1 - 11.1 K/uL    RBC 4.69 4.10 - 5.70 M/uL    HGB 13.2 12.1 - 17.0 g/dL    HCT 40.6 36.6 - 50.3 %    MCV 86.6 80.0 - 99.0 FL    MCH 28.1 26.0 - 34.0 PG    MCHC 32.5 30.0 - 36.5 g/dL    RDW 14.6 (H) 11.5 - 14.5 %    PLATELET 199 150 - 400 K/uL    MPV 10.8 8.9 - 12.9 FL    NEUTROPHILS 66 32 - 75 %    LYMPHOCYTES 28 12 - 49 %    MONOCYTES 6 5 - 13 %    EOSINOPHILS 0 0 - 7 %    BASOPHILS 0 0 - 1 %    IMMATURE GRANULOCYTES 0 0.0 - 0.5 %    ABS. NEUTROPHILS 3.2 1.8 - 8.0 K/UL    ABS. LYMPHOCYTES 1.4 0.8 - 3.5 K/UL    ABS. MONOCYTES 0.3 0.0 - 1.0 K/UL    ABS. EOSINOPHILS 0.0 0.0 - 0.4 K/UL    ABS. BASOPHILS 0.0 0.0 - 0.1 K/UL    ABS. IMM. GRANS. 0.0 0.00 - 0.04 K/UL    DF AUTOMATED     METABOLIC PANEL, COMPREHENSIVE    Collection Time: 08/03/20 10:05 AM   Result Value Ref Range    Sodium 139 136 - 145 mmol/L    Potassium 3.9 3.5 - 5.1 mmol/L    Chloride 99 97 - 108 mmol/L    CO2 25 21 - 32 mmol/L    Anion gap 15 5 - 15 mmol/L    Glucose 88 65 - 100 mg/dL    BUN 8 6 - 20 mg/dL     Creatinine 1.00 0.70 - 1.30 mg/dL    BUN/Creatinine ratio 8 (L) 12 - 20      GFR est AA >60 >60 ml/min/1.81m    GFR est non-AA >60 >60 ml/min/1.748m   Calcium 9.6 8.5 - 10.1 mg/dL    Bilirubin, total 0.6 0.2 - 1.0 mg/dL    AST (SGOT) 103 (H) 15 - 37 U/L    ALT (SGPT) 102 (H) 12 - 78 U/L    Alk. phosphatase 150 (H) 45 - 117 U/L    Protein, total 8.2 6.4 - 8.2 g/dL    Albumin 4.0 3.5 - 5.0 g/dL    Globulin 4.2 (H) 2.0 - 4.0 g/dL    A-G Ratio 1.0 (L) 1.1 - 2.2     LIPASE    Collection Time: 08/03/20 10:05 AM   Result Value Ref Range    Lipase 203 73 - 393 U/L   MAGNESIUM    Collection Time: 08/03/20 10:05 AM   Result Value Ref Range    Magnesium 1.6 1.6 - 2.4 mg/dL   TROPONIN-HIGH SENSITIVITY    Collection Time: 08/03/20 10:05 AM   Result Value Ref Range    Troponin-High Sensitivity 15 0 - 76 ng/L   EKG, 12 LEAD, INITIAL    Collection Time: 08/03/20 10:11 AM   Result Value Ref Range    Ventricular Rate 111 BPM    Atrial Rate 111 BPM    P-R Interval 144 ms    QRS Duration 70 ms    Q-T Interval 354 ms    QTC Calculation (Bezet) 481 ms  Calculated P Axis 67 degrees    Calculated R Axis 27 degrees    Calculated T Axis 81 degrees    Diagnosis       Sinus tachycardia with Premature supraventricular complexes  Nonspecific T wave abnormality  Abnormal ECG  When compared with ECG of 18-Feb-2007 23:35,  Premature supraventricular complexes are now Present  Nonspecific T wave abnormality now evident in Lateral leads  Confirmed by JOSEPH MD, MATHEW ([id number]) on 08/03/2020 11:03:27 AM     GLUCOSE, POC    Collection Time: 08/03/20 10:43 AM   Result Value Ref Range    Glucose (POC) 82 65 - 117 mg/dL    Performed by Quentin Cornwall ABIGAIL    GLUCOSE, POC    Collection Time: 08/03/20  8:51 PM   Result Value Ref Range    Glucose (POC) 197 (H) 65 - 117 mg/dL    Performed by TUOYON JENNIFER    CBC WITH AUTOMATED DIFF    Collection Time: 08/04/20  6:24 AM   Result Value Ref Range    WBC 5.6 4.1 - 11.1 K/uL    RBC 4.88 4.10 - 5.70 M/uL    HGB 13.7  12.1 - 17.0 g/dL    HCT 41.9 36.6 - 50.3 %    MCV 85.9 80.0 - 99.0 FL    MCH 28.1 26.0 - 34.0 PG    MCHC 32.7 30.0 - 36.5 g/dL    RDW 14.6 (H) 11.5 - 14.5 %    PLATELET 167 150 - 400 K/uL    MPV 10.9 8.9 - 12.9 FL    NRBC 0.0 0.0 PER 100 WBC    ABSOLUTE NRBC 0.00 0.00 - 0.01 K/uL    NEUTROPHILS 60 32 - 75 %    LYMPHOCYTES 33 12 - 49 %    MONOCYTES 6 5 - 13 %    EOSINOPHILS 1 0 - 7 %    BASOPHILS 0 0 - 1 %    IMMATURE GRANULOCYTES 0 0 - 0.5 %    ABS. NEUTROPHILS 3.3 1.8 - 8.0 K/UL    ABS. LYMPHOCYTES 1.8 0.8 - 3.5 K/UL    ABS. MONOCYTES 0.3 0.0 - 1.0 K/UL    ABS. EOSINOPHILS 0.1 0.0 - 0.4 K/UL    ABS. BASOPHILS 0.0 0.0 - 0.1 K/UL    ABS. IMM. GRANS. 0.0 0.00 - 0.04 K/UL    DF AUTOMATED     METABOLIC PANEL, COMPREHENSIVE    Collection Time: 08/04/20  6:24 AM   Result Value Ref Range    Sodium 139 136 - 145 mmol/L    Potassium 3.3 (L) 3.5 - 5.1 mmol/L    Chloride 106 97 - 108 mmol/L    CO2 27 21 - 32 mmol/L    Anion gap 6 5 - 15 mmol/L    Glucose 109 (H) 65 - 100 mg/dL    BUN 7 6 - 20 mg/dL    Creatinine 0.88 0.70 - 1.30 mg/dL    BUN/Creatinine ratio 8 (L) 12 - 20      GFR est AA >60 >60 ml/min/1.75m    GFR est non-AA >60 >60 ml/min/1.750m   Calcium 8.7 8.5 - 10.1 mg/dL    Bilirubin, total 0.8 0.2 - 1.0 mg/dL    AST (SGOT) 51 (H) 15 - 37 U/L    ALT (SGPT) 57 12 - 78 U/L    Alk. phosphatase 111 45 - 117 U/L    Protein, total 7.0 6.4 - 8.2 g/dL  Albumin 3.1 (L) 3.5 - 5.0 g/dL    Globulin 3.9 2.0 - 4.0 g/dL    A-G Ratio 0.8 (L) 1.1 - 2.2     GLUCOSE, POC    Collection Time: 08/04/20  8:02 AM   Result Value Ref Range    Glucose (POC) 97 65 - 117 mg/dL    Performed by Earney Navy         No orders to display        PHYSICAL EXAM:    Physical Exam  Vitals reviewed.   Constitutional:       Appearance: He is not ill-appearing.   HENT:      Head: Normocephalic and atraumatic.      Mouth/Throat:      Pharynx: Oropharynx is clear.   Eyes:      Conjunctiva/sclera: Conjunctivae normal.   Cardiovascular:      Rate and  Rhythm: Regular rhythm. Tachycardia present.      Heart sounds: Normal heart sounds.   Pulmonary:      Effort: Pulmonary effort is normal.      Breath sounds: Normal breath sounds.   Abdominal:      General: Abdomen is flat. Bowel sounds are normal.      Palpations: Abdomen is soft.      Tenderness: There is no abdominal tenderness.   Musculoskeletal:         General: Normal range of motion.      Cervical back: Normal range of motion and neck supple.   Skin:     General: Skin is warm and dry.   Neurological:      General: No focal deficit present.      Mental Status: He is alert and oriented to person, place, and time. Mental status is at baseline.   Psychiatric:         Mood and Affect: Mood normal.          Data Review    Recent Results (from the past 24 hour(s))   CBC WITH AUTOMATED DIFF    Collection Time: 08/03/20 10:05 AM   Result Value Ref Range    WBC 4.9 4.1 - 11.1 K/uL    RBC 4.69 4.10 - 5.70 M/uL    HGB 13.2 12.1 - 17.0 g/dL    HCT 40.6 36.6 - 50.3 %    MCV 86.6 80.0 - 99.0 FL    MCH 28.1 26.0 - 34.0 PG    MCHC 32.5 30.0 - 36.5 g/dL    RDW 14.6 (H) 11.5 - 14.5 %    PLATELET 199 150 - 400 K/uL    MPV 10.8 8.9 - 12.9 FL    NEUTROPHILS 66 32 - 75 %    LYMPHOCYTES 28 12 - 49 %    MONOCYTES 6 5 - 13 %    EOSINOPHILS 0 0 - 7 %    BASOPHILS 0 0 - 1 %    IMMATURE GRANULOCYTES 0 0.0 - 0.5 %    ABS. NEUTROPHILS 3.2 1.8 - 8.0 K/UL    ABS. LYMPHOCYTES 1.4 0.8 - 3.5 K/UL    ABS. MONOCYTES 0.3 0.0 - 1.0 K/UL    ABS. EOSINOPHILS 0.0 0.0 - 0.4 K/UL    ABS. BASOPHILS 0.0 0.0 - 0.1 K/UL    ABS. IMM. GRANS. 0.0 0.00 - 0.04 K/UL    DF AUTOMATED     METABOLIC PANEL, COMPREHENSIVE    Collection Time: 08/03/20 10:05 AM   Result Value Ref Range  Sodium 139 136 - 145 mmol/L    Potassium 3.9 3.5 - 5.1 mmol/L    Chloride 99 97 - 108 mmol/L    CO2 25 21 - 32 mmol/L    Anion gap 15 5 - 15 mmol/L    Glucose 88 65 - 100 mg/dL    BUN 8 6 - 20 mg/dL    Creatinine 1.00 0.70 - 1.30 mg/dL    BUN/Creatinine ratio 8 (L) 12 - 20      GFR est  AA >60 >60 ml/min/1.58m    GFR est non-AA >60 >60 ml/min/1.798m   Calcium 9.6 8.5 - 10.1 mg/dL    Bilirubin, total 0.6 0.2 - 1.0 mg/dL    AST (SGOT) 103 (H) 15 - 37 U/L    ALT (SGPT) 102 (H) 12 - 78 U/L    Alk. phosphatase 150 (H) 45 - 117 U/L    Protein, total 8.2 6.4 - 8.2 g/dL    Albumin 4.0 3.5 - 5.0 g/dL    Globulin 4.2 (H) 2.0 - 4.0 g/dL    A-G Ratio 1.0 (L) 1.1 - 2.2     LIPASE    Collection Time: 08/03/20 10:05 AM   Result Value Ref Range    Lipase 203 73 - 393 U/L   MAGNESIUM    Collection Time: 08/03/20 10:05 AM   Result Value Ref Range    Magnesium 1.6 1.6 - 2.4 mg/dL   TROPONIN-HIGH SENSITIVITY    Collection Time: 08/03/20 10:05 AM   Result Value Ref Range    Troponin-High Sensitivity 15 0 - 76 ng/L   EKG, 12 LEAD, INITIAL    Collection Time: 08/03/20 10:11 AM   Result Value Ref Range    Ventricular Rate 111 BPM    Atrial Rate 111 BPM    P-R Interval 144 ms    QRS Duration 70 ms    Q-T Interval 354 ms    QTC Calculation (Bezet) 481 ms    Calculated P Axis 67 degrees    Calculated R Axis 27 degrees    Calculated T Axis 81 degrees    Diagnosis       Sinus tachycardia with Premature supraventricular complexes  Nonspecific T wave abnormality  Abnormal ECG  When compared with ECG of 18-Feb-2007 23:35,  Premature supraventricular complexes are now Present  Nonspecific T wave abnormality now evident in Lateral leads  Confirmed by JOSEPH MD, MATHEW ([id number]) on 08/03/2020 11:03:27 AM     GLUCOSE, POC    Collection Time: 08/03/20 10:43 AM   Result Value Ref Range    Glucose (POC) 82 65 - 117 mg/dL    Performed by ROQuentin CornwallBIGAIL    GLUCOSE, POC    Collection Time: 08/03/20  8:51 PM   Result Value Ref Range    Glucose (POC) 197 (H) 65 - 117 mg/dL    Performed by TUOYON JENNIFER    CBC WITH AUTOMATED DIFF    Collection Time: 08/04/20  6:24 AM   Result Value Ref Range    WBC 5.6 4.1 - 11.1 K/uL    RBC 4.88 4.10 - 5.70 M/uL    HGB 13.7 12.1 - 17.0 g/dL    HCT 41.9 36.6 - 50.3 %    MCV 85.9 80.0 - 99.0 FL    MCH 28.1  26.0 - 34.0 PG    MCHC 32.7 30.0 - 36.5 g/dL    RDW 14.6 (H) 11.5 - 14.5 %    PLATELET 167 150 - 400  K/uL    MPV 10.9 8.9 - 12.9 FL    NRBC 0.0 0.0 PER 100 WBC    ABSOLUTE NRBC 0.00 0.00 - 0.01 K/uL    NEUTROPHILS 60 32 - 75 %    LYMPHOCYTES 33 12 - 49 %    MONOCYTES 6 5 - 13 %    EOSINOPHILS 1 0 - 7 %    BASOPHILS 0 0 - 1 %    IMMATURE GRANULOCYTES 0 0 - 0.5 %    ABS. NEUTROPHILS 3.3 1.8 - 8.0 K/UL    ABS. LYMPHOCYTES 1.8 0.8 - 3.5 K/UL    ABS. MONOCYTES 0.3 0.0 - 1.0 K/UL    ABS. EOSINOPHILS 0.1 0.0 - 0.4 K/UL    ABS. BASOPHILS 0.0 0.0 - 0.1 K/UL    ABS. IMM. GRANS. 0.0 0.00 - 0.04 K/UL    DF AUTOMATED     METABOLIC PANEL, COMPREHENSIVE    Collection Time: 08/04/20  6:24 AM   Result Value Ref Range    Sodium 139 136 - 145 mmol/L    Potassium 3.3 (L) 3.5 - 5.1 mmol/L    Chloride 106 97 - 108 mmol/L    CO2 27 21 - 32 mmol/L    Anion gap 6 5 - 15 mmol/L    Glucose 109 (H) 65 - 100 mg/dL    BUN 7 6 - 20 mg/dL    Creatinine 0.88 0.70 - 1.30 mg/dL    BUN/Creatinine ratio 8 (L) 12 - 20      GFR est AA >60 >60 ml/min/1.38m    GFR est non-AA >60 >60 ml/min/1.75m   Calcium 8.7 8.5 - 10.1 mg/dL    Bilirubin, total 0.8 0.2 - 1.0 mg/dL    AST (SGOT) 51 (H) 15 - 37 U/L    ALT (SGPT) 57 12 - 78 U/L    Alk. phosphatase 111 45 - 117 U/L    Protein, total 7.0 6.4 - 8.2 g/dL    Albumin 3.1 (L) 3.5 - 5.0 g/dL    Globulin 3.9 2.0 - 4.0 g/dL    A-G Ratio 0.8 (L) 1.1 - 2.2     GLUCOSE, POC    Collection Time: 08/04/20  8:02 AM   Result Value Ref Range    Glucose (POC) 97 65 - 117 mg/dL    Performed by ThEarney Navy       Assessment/Plan:     Active Problems:    Withdrawal symptoms, [place] ([facility]      [place]      Hospital course:    Is a [age]ear old male admitted on 08/03/2020 with out: Withdrawal symptoms.  Patient has steadily improved on Librium and CIWA protocol.  Remains mildly weak.  PT OT evaluation.  Essential hypertension uncontrolled while on amlodipine, metoprolol and ACE  inhibitor.  Transaminases are trending down appropriately.  Epigastric abdominal pain is improved with Protonix and Carafate.    1. Alcohol withdrawal  Ciwa  Librium  Thiamine  Folate  May require ICU if patient's overall symptoms do not improve with conservative therapy  ??  2.  Elevated transaminases  Most likely from alcohol abuse  Continue to monitor closely  Repeat CMP daily  ??  3.  Essential hypertension  Amlodipine  Metoprolol  Ramipril or equivalent  ??  4. Diabetes mellitus  NPH 48 units in the morning and 28 units at night only giving Lantus 30 units due to poor appetite  SSI  ??  5. HLD  Rosuvastatin on hold due to elevated transaminases  ??  6. GERD with alcoholic gastritis  Omeprazole  Carafate    Discharge barriers: Improvement in overall alcohol withdrawal    Care Plan discussed with: Patient/Family    Total time spent with patient: >35 minutes.

## 2020-08-05 LAB — METABOLIC PANEL, COMPREHENSIVE
A-G Ratio: 0.7 — ABNORMAL LOW (ref 1.1–2.2)
ALT (SGPT): 44 U/L (ref 12–78)
AST (SGOT): 28 U/L (ref 15–37)
Albumin: 2.9 g/dL — ABNORMAL LOW (ref 3.5–5.0)
Alk. phosphatase: 107 U/L (ref 45–117)
Anion gap: 6 mmol/L (ref 5–15)
BUN/Creatinine ratio: 9 — ABNORMAL LOW (ref 12–20)
BUN: 8 mg/dL (ref 6–20)
Bilirubin, total: 0.5 mg/dL (ref 0.2–1.0)
CO2: 25 mmol/L (ref 21–32)
Calcium: 8.7 mg/dL (ref 8.5–10.1)
Chloride: 107 mmol/L (ref 97–108)
Creatinine: 0.93 mg/dL (ref 0.70–1.30)
GFR est AA: >60 mL/min/{1.73_m2} (ref >60)
GFR est non-AA: >60 mL/min/{1.73_m2} (ref >60)
Globulin: 4.1 g/dL — ABNORMAL HIGH (ref 2.0–4.0)
Glucose: 193 mg/dL — ABNORMAL HIGH (ref 65–100)
Potassium: 3.6 mmol/L (ref 3.5–5.1)
Protein, total: 7 g/dL (ref 6.4–8.2)
Sodium: 138 mmol/L (ref 136–145)

## 2020-08-05 LAB — CBC WITH AUTOMATED DIFF
ABS. BASOPHILS: 0 10*3/uL (ref 0.0–0.1)
ABS. EOSINOPHILS: 0.1 10*3/uL (ref 0.0–0.4)
ABS. IMM. GRANS.: 0 10*3/uL (ref 0.00–0.04)
ABS. LYMPHOCYTES: 2.4 10*3/uL (ref 0.8–3.5)
ABS. MONOCYTES: 0.4 10*3/uL (ref 0.0–1.0)
ABS. NEUTROPHILS: 2.8 10*3/uL (ref 1.8–8.0)
ABSOLUTE NRBC: 0 10*3/uL (ref 0.00–0.01)
BASOPHILS: 0 % (ref 0–1)
EOSINOPHILS: 1 % (ref 0–7)
HCT: 41.5 % (ref 36.6–50.3)
HGB: 13.4 g/dL (ref 12.1–17.0)
IMMATURE GRANULOCYTES: 0 % (ref 0–0.5)
LYMPHOCYTES: 42 % (ref 12–49)
MCH: 27.9 pg (ref 26.0–34.0)
MCHC: 32.3 g/dL (ref 30.0–36.5)
MCV: 86.3 fL (ref 80.0–99.0)
MONOCYTES: 7 % (ref 5–13)
MPV: 11.4 FL (ref 8.9–12.9)
NEUTROPHILS: 50 % (ref 32–75)
NRBC: 0 /100{WBCs}
PLATELET: 151 10*3/uL (ref 150–400)
RBC: 4.81 M/uL (ref 4.10–5.70)
RDW: 14.1 % (ref 11.5–14.5)
WBC: 5.7 10*3/uL (ref 4.1–11.1)

## 2020-08-05 LAB — GLUCOSE, POC
Glucose (POC): 274 mg/dL — ABNORMAL HIGH (ref 65–117)
Glucose (POC): 189 mg/dL — ABNORMAL HIGH (ref 65–117)

## 2020-08-05 LAB — COMPREHENSIVE METABOLIC PANEL
ALT: 44 U/L (ref 12–78)
AST: 28 U/L (ref 15–37)
Albumin/Globulin Ratio: 0.7 — ABNORMAL LOW (ref 1.1–2.2)
Albumin: 2.9 g/dL — ABNORMAL LOW (ref 3.5–5.0)
Alkaline Phosphatase: 107 U/L (ref 45–117)
Anion Gap: 6 mmol/L (ref 5–15)
BUN: 8 mg/dL (ref 6–20)
Bun/Cre Ratio: 9 — ABNORMAL LOW (ref 12–20)
CO2: 25 mmol/L (ref 21–32)
Calcium: 8.7 mg/dL (ref 8.5–10.1)
Chloride: 107 mmol/L (ref 97–108)
Creatinine: 0.93 mg/dL (ref 0.70–1.30)
EGFR IF NonAfrican American: >60 mL/min/{1.73_m2} (ref >60)
GFR African American: >60 mL/min/{1.73_m2} (ref >60)
Globulin: 4.1 g/dL — ABNORMAL HIGH (ref 2.0–4.0)
Glucose: 193 mg/dL — ABNORMAL HIGH (ref 65–100)
Potassium: 3.6 mmol/L (ref 3.5–5.1)
Sodium: 138 mmol/L (ref 136–145)
Total Bilirubin: 0.5 mg/dL (ref 0.2–1.0)
Total Protein: 7 g/dL (ref 6.4–8.2)

## 2020-08-05 LAB — CBC WITH AUTO DIFFERENTIAL
Basophils %: 0 % (ref 0–1)
Basophils Absolute: 0 10*3/uL (ref 0.0–0.1)
Eosinophils %: 1 % (ref 0–7)
Eosinophils Absolute: 0.1 10*3/uL (ref 0.0–0.4)
Granulocyte Absolute Count: 0 10*3/uL (ref 0.00–0.04)
Hematocrit: 41.5 % (ref 36.6–50.3)
Hemoglobin: 13.4 g/dL (ref 12.1–17.0)
Immature Granulocytes: 0 % (ref 0–0.5)
Lymphocytes %: 42 % (ref 12–49)
Lymphocytes Absolute: 2.4 10*3/uL (ref 0.8–3.5)
MCH: 27.9 PG (ref 26.0–34.0)
MCHC: 32.3 g/dL (ref 30.0–36.5)
MCV: 86.3 FL (ref 80.0–99.0)
MPV: 11.4 FL (ref 8.9–12.9)
Monocytes %: 7 % (ref 5–13)
Monocytes Absolute: 0.4 10*3/uL (ref 0.0–1.0)
NRBC Absolute: 0 10*3/uL (ref 0.00–0.01)
Neutrophils %: 50 % (ref 32–75)
Neutrophils Absolute: 2.8 10*3/uL (ref 1.8–8.0)
Nucleated RBCs: 0 PER 100 WBC
Platelets: 151 10*3/uL (ref 150–400)
RBC: 4.81 M/uL (ref 4.10–5.70)
RDW: 14.1 % (ref 11.5–14.5)
WBC: 5.7 10*3/uL (ref 4.1–11.1)

## 2020-08-05 LAB — POCT GLUCOSE
POC Glucose: 274 mg/dL — ABNORMAL HIGH (ref 65–117)
POC Glucose: 189 mg/dL — ABNORMAL HIGH (ref 65–117)

## 2020-08-05 MED ORDER — SUCRALFATE 1 GRAM TAB
1 gram | ORAL_TABLET | Freq: Four times a day (QID) | ORAL | 1 refills | Status: AC
Start: 2020-08-05 — End: ?

## 2020-08-05 MED ORDER — CHLORDIAZEPOXIDE 10 MG CAP
10 mg | ORAL_CAPSULE | Freq: Three times a day (TID) | ORAL | 1 refills | Status: DC
Start: 2020-08-05 — End: 2021-04-27

## 2020-08-05 MED FILL — SUCRALFATE 1 GRAM TAB: 1 gram | ORAL | Qty: 1

## 2020-08-05 MED FILL — FOLIC ACID 0.8 MG TAB: 800 mcg | ORAL | Qty: 1

## 2020-08-05 MED FILL — METOPROLOL TARTRATE 25 MG TAB: 25 mg | ORAL | Qty: 1

## 2020-08-05 MED FILL — POTASSIUM CHLORIDE SR 20 MEQ TAB, PARTICLES/CRYSTALS: 20 mEq | ORAL | Qty: 2

## 2020-08-05 MED FILL — PANTOPRAZOLE 40 MG TAB, DELAYED RELEASE: 40 mg | ORAL | Qty: 1

## 2020-08-05 MED FILL — LISINOPRIL 20 MG TAB: 20 mg | ORAL | Qty: 1

## 2020-08-05 MED FILL — CHLORDIAZEPOXIDE 10 MG CAP: 10 mg | ORAL | Qty: 1

## 2020-08-05 MED FILL — HUMALOG U-100 INSULIN 100 UNIT/ML SUBCUTANEOUS SOLUTION: 100 unit/mL | SUBCUTANEOUS | Qty: 4

## 2020-08-05 MED FILL — LANTUS U-100 INSULIN 100 UNIT/ML SUBCUTANEOUS SOLUTION: 100 unit/mL | SUBCUTANEOUS | Qty: 30

## 2020-08-05 MED FILL — LORAZEPAM 2 MG/ML IJ SOLN: 2 mg/mL | INTRAMUSCULAR | Qty: 1

## 2020-08-05 MED FILL — THIAMINE MONONITRATE 100 MG TABLET: 100 mg | ORAL | Qty: 1

## 2020-08-05 MED FILL — DULOXETINE 30 MG CAP, DELAYED RELEASE: 30 mg | ORAL | Qty: 2

## 2020-08-05 MED FILL — HUMALOG U-100 INSULIN 100 UNIT/ML SUBCUTANEOUS SOLUTION: 100 unit/mL | SUBCUTANEOUS | Qty: 3

## 2020-08-05 MED FILL — AMLODIPINE 5 MG TAB: 5 mg | ORAL | Qty: 2

## 2020-08-05 NOTE — Progress Notes (Signed)
 OCCUPATIONAL THERAPY EVALUATION/DISCHARGE  Patient: Antonio STEGALL Sr. (60 y.o. male)  Date: 08/05/2020  Primary Diagnosis: Withdrawal symptoms, alcohol (HCC) [F10.239]  Alcohol withdrawal (HCC) [F10.239]       Precautions: standard      ASSESSMENT  Pt is a 60 y/o M with PMH of DM, HTN and alcohol abuse admitted to [facility] 08/03/20 and being treated for alcohol withdrawal. Pt received semi-supine in bed upon arrival, AXO x4, and agreeable to OT evaluation at this time. Per pt report, pt lives with a friend in a one-story home with 2 STE and R HR, was IND with ADLs and ambulatory without AD at [facility]. Other DME owned includes: grab bars near toilet.     Based on current observations, pt presents at functional baseline for ADLs/mobility demonstrating good UE AROM/strength, coordination, static/dynamic sitting and standing balance and functional activity tolerance during evaluation today. Pt currently IND with all mobility including to/from EOB, chair, toilet and bathroom mobility; gt belt donned for safety (no AD) however no LOB noted. Pt simulated IND with toileting routine and completed oral hygiene task IND standing sink side with no LOB noted reaching out of BOS for items around sink. Pt left resting comfortably in chair with call bell/needs in reach. Overall, pt tolerates session well with no c/o pain or dizziness with OOB activity. Pt is reported and observed to be at functional baseline with ADLs/mobility with no further skilled OT services indicated. Pt in agreement. Will therefore d/c pt from OT caseload at this time. Recommend d/c home self care once medically appropriate.    Other factors to consider for discharge: IND at baseline     Patient will benefit from skilled therapy intervention to address the above noted impairments.       PLAN :  Recommendation for discharge: (in order for the patient to meet his/her long term goals)  Home Self Care    This discharge recommendation:  Has been made in collaboration with  the attending provider and/or case management    IF patient discharges home will need the following DME: none       SUBJECTIVE:   Patient stated "I'm doing well."    OBJECTIVE DATA SUMMARY:   HISTORY:   Past Medical History:   Diagnosis Date   . Diabetes (HCC)    . High cholesterol    . Hypertension    History reviewed. No pertinent surgical history.    Expanded or extensive additional review of patient history:     Home Situation  Home Environment: Private residence  # Steps to Enter: 2  Rails to Enter: Yes  Hand Rails : Right  One/Two Story Residence: One story  Living Alone: No  Support Systems: Friend/Neighbor  Patient Expects to be Discharged to:: Home with home health  Current DME Used/Available at Home: Grab bars  Tub or Shower Type: Tub/Shower combination      EXAMINATION OF PERFORMANCE DEFICITS:  Cognitive/Behavioral Status:  Neurologic State: Alert  Orientation Level: Oriented X4  Cognition: Follows commands;Appropriate decision making                 Hearing:  Auditory  Auditory Impairment: None    Range of Motion:  AROM: Within functional limits                         Strength:  Strength: Within functional limits                Coordination:  Coordination: Within functional limits  Fine Motor Skills-Upper: Left Intact;Right Intact    Gross Motor Skills-Upper: Left Intact;Right Intact    Balance:  Sitting: Intact;Without support  Standing: Intact;Without support    Functional Mobility and Transfers for ADLs:  Bed Mobility:  Rolling: Independent  Supine to Sit: Independent  Sit to Supine: Independent  Scooting: Independent    Transfers:  Sit to Stand: Independent  Stand to Sit: Independent  Bed to Chair: Independent  Bathroom Mobility: Independent  Toilet Transfer : Independent      ADL Intervention and task modifications:       Grooming  Grooming Assistance: Independent  Position Performed: Standing  Brushing Teeth: Independent                   Lower Body Dressing Assistance  Socks:  Independent  Position Performed: Seated in chair              Functional Measure:    [employer] AM-PACTM 6 Clicks                                                       Daily Activity Inpatient Short Form  How much help from another person does the patient currently need... Total; A Lot A Little None   1.  Putting on and taking off regular lower body clothing? []   1 []   2 []   3 [x]   4   2.  Bathing (including washing, rinsing, drying)? []   1 []   2 []   3 [x]   4   3.  Toileting, which includes using toilet, bedpan or urinal? []  1 []   2 []   3 [x]   4   4.  Putting on and taking off regular upper body clothing? []   1 []   2 []   3 [x]   4   5.  Taking care of personal grooming such as brushing teeth? []   1 []   2 []   3 [x]   4   6.  Eating meals? []   1 []   2 []   3 [x]   4    2007, Trustees of [address], under license to [employer], [place]. All rights reserved     Score: 24/24     Interpretation of Tool:  Represents clinically-significant functional categories (i.e. Activities of daily living).  Percentage of Impairment CH    0%   CI    1-19% CJ    20-39% CK    40-59% CL    60-79% CM    80-99% CN     100%   AMPAC  Score 6-24 24 23  20-22 15-19 10-14 7-9 6         Occupational Therapy Evaluation Charge Determination   History Examination Decision-Making   LOW Complexity : Brief history review  LOW Complexity : 1-3 performance deficits relating to physical, cognitive , or psychosocial skils that result in activity limitations and / or participation restrictions  LOW Complexity : No comorbidities that affect functional and no verbal or physical assistance needed to complete eval tasks       Based on the above components, the patient evaluation is determined to be of the following complexity level: LOW     Pain Rating:  0/10    Activity Tolerance:   WNL    After treatment patient left in no  apparent distress:    Sitting in chair and Call bell within reach    COMMUNICATION/EDUCATION:   The patient's plan of care was  discussed with: Registered nurse, Case management and PA.     Thank you for this referral.  Duwaine Elder  Time Calculation: 18 mins

## 2020-08-05 NOTE — Progress Notes (Signed)
Discharge instructions communicated to patient and spouse, all questions answered, verbalized understanding.  All personal items packed and remain at side until discharge

## 2020-08-05 NOTE — Progress Notes (Signed)
Chart reviewed. DC order noted. Patient previously declined HH.    DCP: home, self care.    Discharge plan of care/case management plan validated with provider discharge order.

## 2020-08-05 NOTE — Discharge Summary (Signed)
Admit date: 08/03/2020   Admitting Provider: Shelda Pal, MD    Discharge date: 08/05/2020  Discharging Provider: Diona Browner, PA-C      * Admission Diagnoses: Withdrawal symptoms, alcohol (HCC) [F10.239]  Alcohol withdrawal (HCC) [F10.239]    * Discharge Diagnoses:    Hospital Problems as of 08/05/2020 Never Reviewed          Codes Class Noted - Resolved POA    Acute alcoholic gastritis ICD-10-CM: V56.43  ICD-9-CM: 535.30  08/05/2020 - Present Unknown        Withdrawal symptoms, alcohol (HCC) ICD-10-CM: P29.518  ICD-9-CM: 291.81  08/03/2020 - Present Unknown        Alcohol withdrawal (HCC) ICD-10-CM: A41.660  ICD-9-CM: 291.81  08/03/2020 - Present Unknown              * Hospital Course:   Is a 59 year old male admitted on 08/03/2020 with aciute alcohol withdrawal symptoms.  Patient has steadily improved on Librium and CIWA protocol.    PT OT evaluation and ambulating.  Essential hypertension uncontrolled while on amlodipine, metoprolol and ACE inhibitor although improved after control of withdrawal symtoms.  Transaminases are trended down to normal .  Epigastric abdominal pain is improved with Protonix and Carafate. Patient well controlled on librium and prescribing 7 day course.     * Procedures:   * No surgery found *      Consults:   None    Significant Diagnostic Studies: As discussed in hospital course    Discharge Exam:  Visit Vitals  BP (!) 150/89 (BP 1 Location: Left lower arm, BP Patient Position: At rest;Lying)   Pulse 96   Temp 97.6 ??F (36.4 ??C)   Resp 22   Ht 5\' 9"  (1.753 m)   Wt 87.1 kg (192 lb)   SpO2 96%   BMI 28.35 kg/m??     PHYSICAL EXAM:  Constitutional: Alert in no acute distress   HEENT: Sclerae anicteric, The neck is supple.   Cardiovascular: Regular rate and rhythm. No murmurs, gallops, or rubs.   Respiratory: Clear breath sounds with no wheezes, rales, or rhonchi.   GI: Abdomen nondistended, soft, and nontender. Normal active bowel sounds.   Rectal: Deferred   Musculoskeletal: No pitting  edema of the lower legs. Extremities have good range of motion.    Neurological:  Patient is alert and oriented. Cranial nerves II-XII intact  Psychiatric: Mood appears appropriate with judgement intact.   Lymphatic: No cervical or supraclavicular adenopathy.   Skin: No rashes or breakdown of the skin      * Discharge Condition: stable  * Disposition: Home    Discharge Medications:  Current Discharge Medication List      START taking these medications    Details   chlordiazePOXIDE (LIBRIUM) 10 mg capsule Take 1 Capsule by mouth three (3) times daily. Max Daily Amount: 30 mg.  Qty: 21 Capsule, Refills: 1  Start date: 08/05/2020    Associated Diagnoses: Alcohol withdrawal syndrome with complication (HCC)      sucralfate (CARAFATE) 1 gram tablet Take 1 Tablet by mouth Before breakfast, lunch, dinner and at bedtime.  Qty: 21 Tablet, Refills: 1  Start date: 08/05/2020         CONTINUE these medications which have NOT CHANGED    Details   insulin regular (NOVOLIN R, HUMULIN R) 100 unit/mL injection by SubCUTAneous route. On a scale insulin      omeprazole (PRILOSEC) 40 mg capsule Take 40 mg by mouth  daily.      ramipriL (ALTACE) 10 mg capsule Take 10 mg by mouth daily.      metoprolol tartrate (LOPRESSOR) 25 mg tablet Take 25 mg by mouth two (2) times a day.      rosuvastatin (Crestor) 40 mg tablet Take 40 mg by mouth nightly.      amLODIPine (NORVASC) 10 mg tablet Take 10 mg by mouth.      gabapentin (NEURONTIN) 600 mg tablet Take 600 mg by mouth At bedtime.      DULoxetine (CYMBALTA) 60 mg capsule Take  by mouth.      insulin nph-regular human rec (NovoLIN 70-30 FlexPen U-100) 100 unit/mL (70-30) inpn Inject 48 units qAM, 28 units qhs  Qty: 2 Each, Refills: 0             * Follow-up Care/Patient Instructions:  Activity: Activity as tolerated  Diet: Diabetic Diet  Wound Care: None needed    Follow-up Information     Follow up With Specialties Details Why Contact Info    Kibot, Marchia Meiers, NP Nurse Practitioner In 1 week  [address]  [place] [address]  [phone]            Discharge summary greater than 35 minutes spent with the patient performing discharge instructions, medication review and physical exam    Signed:  Diona Browner, PA-C  08/05/2020  9:21 AM

## 2020-08-05 NOTE — Progress Notes (Signed)
Problem: Falls - Risk of  Goal: *Absence of Falls  Description: Document Antonio Melendez Fall Risk and appropriate interventions in the flowsheet.  Outcome: Progressing Towards Goal  Note: Fall Risk Interventions:  Mobility Interventions: Bed/chair exit alarm,Patient to call before getting OOB         Medication Interventions: Bed/chair exit alarm,Patient to call before getting OOB                   Problem: Diabetes Self-Management  Goal: *Disease process and treatment process  Description: Define diabetes and identify own type of diabetes; list 3 options for treating diabetes.  Outcome: Progressing Towards Goal  Goal: *Incorporating nutritional management into lifestyle  Description: Describe effect of type, amount and timing of food on blood glucose; list 3 methods for planning meals.  Outcome: Progressing Towards Goal  Goal: *Incorporating physical activity into lifestyle  Description: State effect of exercise on blood glucose levels.  Outcome: Progressing Towards Goal  Goal: *Developing strategies to promote health/change behavior  Description: Define the ABC's of diabetes; identify appropriate screenings, schedule and personal plan for screenings.  Outcome: Progressing Towards Goal

## 2020-08-05 NOTE — Progress Notes (Signed)
PT treatment attempted at 10:04 however, Patient found to be taking shower at this time. Will continue to follow pt and will attempt treatment at a later time. Thank you

## 2020-08-18 ENCOUNTER — Emergency Department: Admit: 2020-08-18 | Payer: BLUE CROSS/BLUE SHIELD | Primary: Family Medicine

## 2020-08-18 ENCOUNTER — Inpatient Hospital Stay
Admit: 2020-08-18 | Discharge: 2020-08-26 | Discharge status: Against Medical Advice | Payer: BLUE CROSS/BLUE SHIELD | Attending: Hospitalist | Admitting: Hospitalist

## 2020-08-18 DIAGNOSIS — A419 Sepsis, unspecified organism: Secondary | ICD-10-CM

## 2020-08-18 LAB — METABOLIC PANEL, COMPREHENSIVE
A-G Ratio: 0.7 — ABNORMAL LOW (ref 1.1–2.2)
ALT (SGPT): 241 U/L — ABNORMAL HIGH (ref 12–78)
AST (SGOT): 347 U/L — ABNORMAL HIGH (ref 15–37)
Albumin: 3.7 g/dL (ref 3.5–5.0)
Alk. phosphatase: 300 U/L — ABNORMAL HIGH (ref 45–117)
Anion gap: 14 mmol/L (ref 5–15)
BUN/Creatinine ratio: 8 — ABNORMAL LOW (ref 12–20)
BUN: 12 mg/dL (ref 6–20)
Bilirubin, total: 1.9 mg/dL — ABNORMAL HIGH (ref 0.2–1.0)
CO2: 23 mmol/L (ref 21–32)
Calcium: 8.8 mg/dL (ref 8.5–10.1)
Chloride: 89 mmol/L — ABNORMAL LOW (ref 97–108)
Creatinine: 1.42 mg/dL — ABNORMAL HIGH (ref 0.70–1.30)
GFR est AA: >60 mL/min/{1.73_m2} (ref >60)
GFR est non-AA: 51 mL/min/{1.73_m2} — ABNORMAL LOW (ref >60)
Globulin: 5.2 g/dL — ABNORMAL HIGH (ref 2.0–4.0)
Glucose: 273 mg/dL — ABNORMAL HIGH (ref 65–100)
Potassium: 4.6 mmol/L (ref 3.5–5.1)
Protein, total: 8.9 g/dL — ABNORMAL HIGH (ref 6.4–8.2)
Sodium: 126 mmol/L — ABNORMAL LOW (ref 136–145)

## 2020-08-18 LAB — URINALYSIS W/ RFLX MICROSCOPIC
Bilirubin, Urine: NEGATIVE
Bilirubin: NEGATIVE
Glucose, Ur: >1000 mg/dL — AB
Glucose: >1000 mg/dL — AB
Ketone: 15 mg/dL — AB
Ketones, Urine: 15 mg/dL — AB
Nitrite, Urine: NEGATIVE
Nitrites: NEGATIVE
Protein, UA: 30 mg/dL — AB
Protein: 30 mg/dL — AB
Specific Gravity, UA: 1.01 (ref 1.003–1.030)
Specific gravity: 1.01 (ref 1.003–1.030)
Urobilinogen, UA, POCT: 0.1 EU/dL — ABNORMAL LOW (ref 0.2–1.0)
Urobilinogen: 0.1 EU/dL — ABNORMAL LOW (ref 0.2–1.0)
pH (UA): 6 (ref 5.0–8.0)
pH, UA: 6 (ref 5.0–8.0)

## 2020-08-18 LAB — NT-PRO BNP: NT pro-BNP: 541 pg/mL — ABNORMAL HIGH (ref <125)

## 2020-08-18 LAB — EKG, 12 LEAD, INITIAL
Atrial Rate: 142 {beats}/min
Calculated P Axis: 66 degrees
Calculated R Axis: 40 degrees
Calculated T Axis: 145 degrees
P-R Interval: 140 ms
Q-T Interval: 272 ms
QRS Duration: 70 ms
QTC Calculation (Bezet): 418 ms
Ventricular Rate: 142 {beats}/min

## 2020-08-18 LAB — CBC WITH AUTOMATED DIFF
ABS. BASOPHILS: 0 10*3/uL (ref 0.0–0.1)
ABS. EOSINOPHILS: 0 10*3/uL (ref 0.0–0.4)
ABS. IMM. GRANS.: 0.1 10*3/uL — ABNORMAL HIGH (ref 0.00–0.04)
ABS. LYMPHOCYTES: 1.1 10*3/uL (ref 0.8–3.5)
ABS. MONOCYTES: 0.8 10*3/uL (ref 0.0–1.0)
ABS. NEUTROPHILS: 10.5 10*3/uL — ABNORMAL HIGH (ref 1.8–8.0)
BASOPHILS: 0 % (ref 0–1)
EOSINOPHILS: 0 % (ref 0–7)
HCT: 47.3 % (ref 36.6–50.3)
HGB: 16 g/dL (ref 12.1–17.0)
IMMATURE GRANULOCYTES: 1 % — ABNORMAL HIGH (ref 0.0–0.5)
LYMPHOCYTES: 9 % — ABNORMAL LOW (ref 12–49)
MCH: 28 PG (ref 26.0–34.0)
MCHC: 33.8 g/dL (ref 30.0–36.5)
MCV: 82.8 FL (ref 80.0–99.0)
MONOCYTES: 7 % (ref 5–13)
MPV: 12 FL (ref 8.9–12.9)
NEUTROPHILS: 83 % — ABNORMAL HIGH (ref 32–75)
PLATELET: 221 10*3/uL (ref 150–400)
RBC: 5.71 M/uL — ABNORMAL HIGH (ref 4.10–5.70)
RDW: 14.2 % (ref 11.5–14.5)
WBC: 12.5 10*3/uL — ABNORMAL HIGH (ref 4.1–11.1)

## 2020-08-18 LAB — DRUG SCREEN, URINE
AMPHETAMINES: NEGATIVE
Amphetamine Screen, Urine: NEGATIVE
BARBITURATES: NEGATIVE
BENZODIAZEPINES: POSITIVE — AB
Barbiturate Screen, Urine: NEGATIVE
Benzodiazepine Screen, Urine: POSITIVE — AB
Buprenorphine Screen, Urine: NEGATIVE
Buprenorphine screen, urine: NEGATIVE
COCAINE: NEGATIVE
Cocaine Screen Urine: NEGATIVE
METHADONE: NEGATIVE
Methadone Screen, Urine: NEGATIVE
Methamphetamines: NEGATIVE
Methamphetamines: NEGATIVE
OPIATES: NEGATIVE
OXYCODONE SCREEN: NEGATIVE
Opiate Screen, Urine: NEGATIVE
Oxycodone Screen: NEGATIVE
PCP Screen, Urine: NEGATIVE
PCP(PHENCYCLIDINE): NEGATIVE
PROPOXYPHENE,PPX: NEGATIVE
PROPOXYPHENE: NEGATIVE
THC (TH-CANNABINOL): NEGATIVE
THC Screen, Urine: NEGATIVE
TRICYCLICS,TCAT: NEGATIVE
TRICYCLICS: NEGATIVE

## 2020-08-18 LAB — URINE MICROSCOPIC

## 2020-08-18 LAB — LACTIC ACID
Lactic Acid: 4.6 mmol/L (ref 0.4–2.0)
Lactic Acid: 2.8 mmol/L (ref 0.4–2.0)
Lactic acid: 4.6 mmol/L — CR (ref 0.4–2.0)
Lactic acid: 2.8 mmol/L — CR (ref 0.4–2.0)

## 2020-08-18 LAB — TROPONIN-HIGH SENSITIVITY: Troponin-High Sensitivity: 17 ng/L (ref 0–76)

## 2020-08-18 LAB — GLUCOSE, POC: Glucose (POC): 241 mg/dL — ABNORMAL HIGH (ref 65–117)

## 2020-08-18 LAB — ETHYL ALCOHOL
ALCOHOL(ETHYL),SERUM: <10 mg/dL (ref <10)
Ethyl Alcohol: <10 mg/dL (ref <10)

## 2020-08-18 LAB — LIPASE
Lipase: 1093 U/L — ABNORMAL HIGH (ref 73–393)
Lipase: 1093 U/L — ABNORMAL HIGH (ref 73–393)

## 2020-08-18 LAB — EKG 12-LEAD
Atrial Rate: 142 {beats}/min
P Axis: 66 degrees
P-R Interval: 140 ms
Q-T Interval: 272 ms
QRS Duration: 70 ms
QTc Calculation (Bazett): 418 ms
R Axis: 40 degrees
T Axis: 145 degrees
Ventricular Rate: 142 {beats}/min

## 2020-08-18 LAB — CBC WITH AUTO DIFFERENTIAL
Basophils %: 0 % (ref 0–1)
Basophils Absolute: 0 10*3/uL (ref 0.0–0.1)
Eosinophils %: 0 % (ref 0–7)
Eosinophils Absolute: 0 10*3/uL (ref 0.0–0.4)
Granulocyte Absolute Count: 0.1 10*3/uL — ABNORMAL HIGH (ref 0.00–0.04)
Hematocrit: 47.3 % (ref 36.6–50.3)
Hemoglobin: 16 g/dL (ref 12.1–17.0)
Immature Granulocytes: 1 % — ABNORMAL HIGH (ref 0.0–0.5)
Lymphocytes %: 9 % — ABNORMAL LOW (ref 12–49)
Lymphocytes Absolute: 1.1 10*3/uL (ref 0.8–3.5)
MCH: 28 PG (ref 26.0–34.0)
MCHC: 33.8 g/dL (ref 30.0–36.5)
MCV: 82.8 FL (ref 80.0–99.0)
MPV: 12 FL (ref 8.9–12.9)
Monocytes %: 7 % (ref 5–13)
Monocytes Absolute: 0.8 10*3/uL (ref 0.0–1.0)
Neutrophils %: 83 % — ABNORMAL HIGH (ref 32–75)
Neutrophils Absolute: 10.5 10*3/uL — ABNORMAL HIGH (ref 1.8–8.0)
Platelets: 221 10*3/uL (ref 150–400)
RBC: 5.71 M/uL — ABNORMAL HIGH (ref 4.10–5.70)
RDW: 14.2 % (ref 11.5–14.5)
WBC: 12.5 10*3/uL — ABNORMAL HIGH (ref 4.1–11.1)

## 2020-08-18 LAB — COMPREHENSIVE METABOLIC PANEL
ALT: 241 U/L — ABNORMAL HIGH (ref 12–78)
AST: 347 U/L — ABNORMAL HIGH (ref 15–37)
Albumin/Globulin Ratio: 0.7 — ABNORMAL LOW (ref 1.1–2.2)
Albumin: 3.7 g/dL (ref 3.5–5.0)
Alkaline Phosphatase: 300 U/L — ABNORMAL HIGH (ref 45–117)
Anion Gap: 14 mmol/L (ref 5–15)
BUN: 12 mg/dL (ref 6–20)
Bun/Cre Ratio: 8 — ABNORMAL LOW (ref 12–20)
CO2: 23 mmol/L (ref 21–32)
Calcium: 8.8 mg/dL (ref 8.5–10.1)
Chloride: 89 mmol/L — ABNORMAL LOW (ref 97–108)
Creatinine: 1.42 mg/dL — ABNORMAL HIGH (ref 0.70–1.30)
EGFR IF NonAfrican American: 51 mL/min/{1.73_m2} — ABNORMAL LOW (ref >60)
GFR African American: >60 mL/min/{1.73_m2} (ref >60)
Globulin: 5.2 g/dL — ABNORMAL HIGH (ref 2.0–4.0)
Glucose: 273 mg/dL — ABNORMAL HIGH (ref 65–100)
Potassium: 4.6 mmol/L (ref 3.5–5.1)
Sodium: 126 mmol/L — ABNORMAL LOW (ref 136–145)
Total Bilirubin: 1.9 mg/dL — ABNORMAL HIGH (ref 0.2–1.0)
Total Protein: 8.9 g/dL — ABNORMAL HIGH (ref 6.4–8.2)

## 2020-08-18 LAB — PROBNP, N-TERMINAL: BNP: 541 pg/mL — ABNORMAL HIGH (ref <125)

## 2020-08-18 LAB — TROPONIN, HIGH SENSITIVITY: Troponin, High Sensitivity: 17 ng/L (ref 0–76)

## 2020-08-18 LAB — POCT GLUCOSE: POC Glucose: 241 mg/dL — ABNORMAL HIGH (ref 65–117)

## 2020-08-18 MED ORDER — SODIUM CHLORIDE 0.9 % IV PIGGY BACK
4.5 gram | INTRAVENOUS | Status: DC
Start: 2020-08-18 — End: 2020-08-18

## 2020-08-18 MED ORDER — PHARMACY VANCOMYCIN NOTE
Status: DC
Start: 2020-08-18 — End: 2020-08-19

## 2020-08-18 MED ORDER — INSULIN NPH HUMAN RECOMB 100 UNIT/ML INJECTION
100 unit/mL | Freq: Two times a day (BID) | SUBCUTANEOUS | Status: DC
Start: 2020-08-18 — End: 2020-08-26
  Administered 2020-08-19 – 2020-08-26 (×11): via SUBCUTANEOUS

## 2020-08-18 MED ORDER — PHARMACY VANCOMYCIN NOTE
Freq: Once | Status: DC
Start: 2020-08-18 — End: 2020-08-19
  Administered 2020-08-19: 08:00:00

## 2020-08-18 MED ORDER — MORPHINE 2 MG/ML INJECTION
2 mg/mL | Freq: Once | INTRAMUSCULAR | Status: AC
Start: 2020-08-18 — End: 2020-08-18
  Administered 2020-08-18: 18:00:00 via INTRAVENOUS

## 2020-08-18 MED ORDER — CHLORDIAZEPOXIDE 10 MG CAP
10 mg | Freq: Three times a day (TID) | ORAL | Status: DC
Start: 2020-08-18 — End: 2020-08-20
  Administered 2020-08-19 – 2020-08-20 (×5): via ORAL

## 2020-08-18 MED ORDER — LISINOPRIL 40 MG TAB
40 mg | Freq: Every day | ORAL | Status: DC
Start: 2020-08-18 — End: 2020-08-26
  Administered 2020-08-19 – 2020-08-26 (×8): via ORAL

## 2020-08-18 MED ORDER — ACETAMINOPHEN 650 MG RECTAL SUPPOSITORY
650 mg | Freq: Four times a day (QID) | RECTAL | Status: DC | PRN
Start: 2020-08-18 — End: 2020-08-26

## 2020-08-18 MED ORDER — SODIUM CHLORIDE 0.9 % IV
INTRAVENOUS | Status: DC
Start: 2020-08-18 — End: 2020-08-22
  Administered 2020-08-18 – 2020-08-22 (×11): via INTRAVENOUS

## 2020-08-18 MED ORDER — ACETAMINOPHEN 325 MG TABLET
325 mg | Freq: Four times a day (QID) | ORAL | Status: DC | PRN
Start: 2020-08-18 — End: 2020-08-26

## 2020-08-18 MED ORDER — POLYETHYLENE GLYCOL 3350 17 GRAM (100 %) ORAL POWDER PACKET
17 gram | Freq: Every day | ORAL | Status: DC | PRN
Start: 2020-08-18 — End: 2020-08-26

## 2020-08-18 MED ORDER — SODIUM CHLORIDE 0.9% BOLUS IV
0.9 % | Freq: Once | INTRAVENOUS | Status: DC
Start: 2020-08-18 — End: 2020-08-18

## 2020-08-18 MED ORDER — SODIUM CHLORIDE 0.9% BOLUS IV
0.9 % | Freq: Once | INTRAVENOUS | Status: AC
Start: 2020-08-18 — End: 2020-08-18
  Administered 2020-08-18: 14:00:00 via INTRAVENOUS

## 2020-08-18 MED ORDER — PROCHLORPERAZINE EDISYLATE 5 MG/ML INJECTION
5 mg/mL | INTRAMUSCULAR | Status: DC | PRN
Start: 2020-08-18 — End: 2020-08-19
  Administered 2020-08-18 – 2020-08-19 (×3): via INTRAVENOUS

## 2020-08-18 MED ORDER — SODIUM CHLORIDE 0.9 % IJ SYRG
INTRAMUSCULAR | Status: DC | PRN
Start: 2020-08-18 — End: 2020-08-26

## 2020-08-18 MED ORDER — HYDRALAZINE 20 MG/ML IJ SOLN
20 mg/mL | Freq: Four times a day (QID) | INTRAMUSCULAR | Status: DC | PRN
Start: 2020-08-18 — End: 2020-08-26
  Administered 2020-08-18 – 2020-08-24 (×5): via INTRAVENOUS

## 2020-08-18 MED ORDER — DEXTROSE 10% IN WATER (D10W) IV
10 % | INTRAVENOUS | Status: DC | PRN
Start: 2020-08-18 — End: 2020-08-26
  Administered 2020-08-20: 11:00:00 via INTRAVENOUS

## 2020-08-18 MED ORDER — LORAZEPAM 2 MG/ML IJ SOLN
2 mg/mL | INTRAMUSCULAR | Status: DC | PRN
Start: 2020-08-18 — End: 2020-08-26
  Administered 2020-08-19 – 2020-08-23 (×2): via INTRAVENOUS

## 2020-08-18 MED ORDER — SODIUM CHLORIDE 0.9% BOLUS IV
0.9 % | Freq: Once | INTRAVENOUS | Status: AC
Start: 2020-08-18 — End: 2020-08-18
  Administered 2020-08-18: 16:00:00 via INTRAVENOUS

## 2020-08-18 MED ORDER — SODIUM CHLORIDE 0.9 % IV PIGGY BACK
4.5 gram | INTRAVENOUS | Status: AC
Start: 2020-08-18 — End: 2020-08-18
  Administered 2020-08-18: 16:00:00 via INTRAVENOUS

## 2020-08-18 MED ORDER — MORPHINE 2 MG/ML INJECTION
2 mg/mL | INTRAMUSCULAR | Status: DC | PRN
Start: 2020-08-18 — End: 2020-08-19
  Administered 2020-08-18 – 2020-08-19 (×4): via INTRAVENOUS

## 2020-08-18 MED ORDER — ONDANSETRON (PF) 4 MG/2 ML INJECTION
4 mg/2 mL | INTRAMUSCULAR | Status: AC
Start: 2020-08-18 — End: 2020-08-18
  Administered 2020-08-18: 14:00:00 via INTRAVENOUS

## 2020-08-18 MED ORDER — SODIUM CHLORIDE 0.9 % INJECTION
20 mg/2 mL | Freq: Two times a day (BID) | INTRAMUSCULAR | Status: DC
Start: 2020-08-18 — End: 2020-08-26
  Administered 2020-08-18 – 2020-08-26 (×17): via INTRAVENOUS

## 2020-08-18 MED ORDER — VANCOMYCIN 1,000 MG IV SOLR
1000 mg | Freq: Once | INTRAVENOUS | Status: AC
Start: 2020-08-18 — End: 2020-08-18
  Administered 2020-08-18: 19:00:00 via INTRAVENOUS

## 2020-08-18 MED ORDER — GLUCOSE 4 GRAM CHEWABLE TAB
4 gram | ORAL | Status: DC | PRN
Start: 2020-08-18 — End: 2020-08-26

## 2020-08-18 MED ORDER — SODIUM CHLORIDE 0.9 % IJ SYRG
Freq: Three times a day (TID) | INTRAMUSCULAR | Status: DC
Start: 2020-08-18 — End: 2020-08-26
  Administered 2020-08-19 – 2020-08-26 (×24): via INTRAVENOUS

## 2020-08-18 MED ORDER — MORPHINE 4 MG/ML INTRAVENOUS SOLUTION
4 mg/mL | Freq: Once | INTRAVENOUS | Status: AC
Start: 2020-08-18 — End: 2020-08-18
  Administered 2020-08-18: 16:00:00 via INTRAVENOUS

## 2020-08-18 MED ORDER — IOPAMIDOL 76 % IV SOLN
370 mg iodine /mL (76 %) | Freq: Once | INTRAVENOUS | Status: AC
Start: 2020-08-18 — End: 2020-08-18
  Administered 2020-08-18: 16:00:00 via INTRAVENOUS

## 2020-08-18 MED ORDER — ENOXAPARIN 40 MG/0.4 ML SUB-Q SYRINGE
40 mg/0.4 mL | Freq: Every day | SUBCUTANEOUS | Status: DC
Start: 2020-08-18 — End: 2020-08-26
  Administered 2020-08-19 – 2020-08-26 (×8): via SUBCUTANEOUS

## 2020-08-18 MED ORDER — METOPROLOL TARTRATE 25 MG TAB
25 mg | Freq: Two times a day (BID) | ORAL | Status: DC
Start: 2020-08-18 — End: 2020-08-25
  Administered 2020-08-19 – 2020-08-25 (×14): via ORAL

## 2020-08-18 MED ORDER — FAMOTIDINE (PF) 20 MG/2 ML IV
20 mg/2 mL | Freq: Two times a day (BID) | INTRAVENOUS | Status: DC
Start: 2020-08-18 — End: 2020-08-18

## 2020-08-18 MED ORDER — INSULIN LISPRO 100 UNIT/ML INJECTION
100 unit/mL | Freq: Four times a day (QID) | SUBCUTANEOUS | Status: DC
Start: 2020-08-18 — End: 2020-08-26
  Administered 2020-08-19 – 2020-08-26 (×21): via SUBCUTANEOUS

## 2020-08-18 MED ORDER — ONDANSETRON 4 MG TAB, RAPID DISSOLVE
4 mg | Freq: Three times a day (TID) | ORAL | Status: DC | PRN
Start: 2020-08-18 — End: 2020-08-26

## 2020-08-18 MED ORDER — GLUCAGON 1 MG INJECTION
1 mg | INTRAMUSCULAR | Status: DC | PRN
Start: 2020-08-18 — End: 2020-08-26

## 2020-08-18 MED ORDER — PROCHLORPERAZINE EDISYLATE 5 MG/ML INJECTION
5 mg/mL | INTRAMUSCULAR | Status: DC | PRN
Start: 2020-08-18 — End: 2020-08-18

## 2020-08-18 MED ORDER — PIPERACILLIN-TAZOBACTAM 3.375 GRAM IV SOLR
3.375 gram | Freq: Three times a day (TID) | INTRAVENOUS | Status: DC
Start: 2020-08-18 — End: 2020-08-22
  Administered 2020-08-19 – 2020-08-22 (×12): via INTRAVENOUS

## 2020-08-18 MED ORDER — ONDANSETRON (PF) 4 MG/2 ML INJECTION
4 mg/2 mL | Freq: Four times a day (QID) | INTRAMUSCULAR | Status: DC | PRN
Start: 2020-08-18 — End: 2020-08-26
  Administered 2020-08-18 – 2020-08-22 (×3): via INTRAVENOUS

## 2020-08-18 MED ORDER — MORPHINE 2 MG/ML INJECTION
2 mg/mL | INTRAMUSCULAR | Status: DC | PRN
Start: 2020-08-18 — End: 2020-08-18

## 2020-08-18 MED FILL — DEXTROSE 10% IN WATER (D10W) IV: 10 % | INTRAVENOUS | Qty: 250

## 2020-08-18 MED FILL — SODIUM CHLORIDE 0.9 % IV: INTRAVENOUS | Qty: 1000

## 2020-08-18 MED FILL — HYDRALAZINE 20 MG/ML IJ SOLN: 20 mg/mL | INTRAMUSCULAR | Qty: 1

## 2020-08-18 MED FILL — SODIUM CHLORIDE 0.9 % IJ SYRG: INTRAMUSCULAR | Qty: 40

## 2020-08-18 MED FILL — PHARMACY VANCOMYCIN NOTE: Qty: 1

## 2020-08-18 MED FILL — ISOVUE-370  76 % INTRAVENOUS SOLUTION: 370 mg iodine /mL (76 %) | INTRAVENOUS | Qty: 100

## 2020-08-18 MED FILL — FAMOTIDINE (PF) 20 MG/2 ML IV: 20 mg/2 mL | INTRAVENOUS | Qty: 2

## 2020-08-18 MED FILL — VANCOMYCIN 1,000 MG IV SOLR: 1000 mg | INTRAVENOUS | Qty: 1000

## 2020-08-18 MED FILL — PIPERACILLIN-TAZOBACTAM 4.5 GRAM IV SOLR: 4.5 gram | INTRAVENOUS | Qty: 4.5

## 2020-08-18 MED FILL — MORPHINE 4 MG/ML SYRINGE: 4 mg/mL | INTRAMUSCULAR | Qty: 1

## 2020-08-18 MED FILL — ONDANSETRON (PF) 4 MG/2 ML INJECTION: 4 mg/2 mL | INTRAMUSCULAR | Qty: 2

## 2020-08-18 MED FILL — MORPHINE 2 MG/ML INJECTION: 2 mg/mL | INTRAMUSCULAR | Qty: 1

## 2020-08-18 MED FILL — PROCHLORPERAZINE EDISYLATE 5 MG/ML INJECTION: 5 mg/mL | INTRAMUSCULAR | Qty: 2

## 2020-08-18 MED FILL — MORPHINE 2 MG/ML INJECTION: 2 mg/mL | INTRAMUSCULAR | Qty: 3

## 2020-08-18 NOTE — H&P (Signed)
History and Physical    Patient: Antonio RAMSBURG Sr. MRN: [id number]  SSN: [id number]    Date of Birth: 1960/08/18  Age: 60 y.o.  Sex: male      Subjective:      Bjorn Hallas. is a 60 y.o. male with a PMH of diabetes, hypertension, alcohol abuse, and hyperlipidemia who presented to the freestanding ED with abdominal pain. In FSED tachycardic and hypertensive. Patient started having nausea vomiting and abdominal pain with chills last night. Patient has been consuming multiple packs of beer or bottles of alcohol nearly daily for the past 3 weeks due to a family dispute. Patient denies fever, hematemesis, hemoptysis, chest pain, shortness of breath, dysuria, or headache. Patient denies history of hepatitis. Patient denies recent travel or contact with sick individuals. Patient denies any tobacco or illicit drug use. Patient is a full code. Initial labs significant for WBC of 12.5, sodium of 126, glucose of 273, bilirubin of 1.9, ALT of 241, AST of 347, alk phosphatase of 300, lipase of 1093, lactate of 4.6, and BNP of 541. Urinalysis with proteinuria, glucosuria, ketonuria, hematuria, pyuria and leuk esterase with bacteria. CT of the abdomen showed moderate acute pancreatitis with edema of the adjacent gastric wall. Patient admitted for further work-up. Patient started on IV Pepcid, fluids, Zosyn, and pain control. GI and general surgery consulted.    Past Medical History:   Diagnosis Date   ??? Diabetes ([place])    ??? High cholesterol    ??? Hypertension      Past Surgical History:   Procedure Laterality Date   ??? HX CHOLECYSTECTOMY        Family History   Problem Relation Age of Onset   ??? Hypertension Mother      Social History     Tobacco Use   ??? Smoking status: Never Smoker   ??? Smokeless tobacco: Never Used   Substance Use Topics   ??? Alcohol use: Yes     Alcohol/week: 74.0 standard drinks     Types: 74 Cans of beer per week      Prior to Admission medications    Medication Sig Start Date End Date Taking? Authorizing  Provider   chlordiazePOXIDE (LIBRIUM) 10 mg capsule Take 1 Capsule by mouth three (3) times daily. Max Daily Amount: 30 mg. 08/05/20   Reasoner, Gasper Sells, PA-C   sucralfate (CARAFATE) 1 gram tablet Take 1 Tablet by mouth Before breakfast, lunch, dinner and at bedtime. 08/05/20   Reasoner, Gasper Sells, PA-C   SITagliptin (Januvia) 50 mg tablet Take 50 mg by mouth daily.    Provider, Historical   insulin regular (NOVOLIN R, HUMULIN R) 100 unit/mL injection by SubCUTAneous route. On a scale insulin    Provider, Historical   omeprazole (PRILOSEC) 40 mg capsule Take 40 mg by mouth daily.    Provider, Historical   ramipriL (ALTACE) 10 mg capsule Take 10 mg by mouth daily.    Provider, Historical   metoprolol tartrate (LOPRESSOR) 25 mg tablet Take 25 mg by mouth two (2) times a day.    Provider, Historical   rosuvastatin (Crestor) 40 mg tablet Take 40 mg by mouth nightly.    Provider, Historical   amLODIPine (NORVASC) 10 mg tablet Take 10 mg by mouth. 11/25/19   Other, Phys, MD   gabapentin (NEURONTIN) 600 mg tablet Take 600 mg by mouth At bedtime. 11/03/19 12/10/20  Other, Phys, MD   DULoxetine (CYMBALTA) 60 mg capsule Take  by mouth. 01/29/20  Other, Phys, MD   insulin nph-regular human rec (NovoLIN 70-30 FlexPen U-100) 100 unit/mL (70-30) inpn Inject 48 units qAM, 28 units qhs 08/02/20   Staci Acosta D, DO        No Known Allergies    Review of Systems:  Review of Systems   Constitutional: Positive for chills. Negative for fever.   Respiratory: Negative.    Cardiovascular: Negative.    Gastrointestinal: Positive for abdominal pain, nausea and vomiting. Negative for diarrhea.   Genitourinary: Negative for dysuria and frequency.   All other systems reviewed and are negative.       Objective:     Recent Results (from the past 24 hour(s))   EKG, 12 LEAD, INITIAL    Collection Time: 08/18/20 10:17 AM   Result Value Ref Range    Ventricular Rate 142 BPM    Atrial Rate 142 BPM    P-R Interval 140 ms    QRS Duration 70 ms    Q-T  Interval 272 ms    QTC Calculation (Bezet) 418 ms    Calculated P Axis 66 degrees    Calculated R Axis 40 degrees    Calculated T Axis 145 degrees    Diagnosis       Sinus tachycardia  ST & T wave abnormality, consider inferolateral ischemia  Abnormal ECG  When compared with ECG of 03-Aug-2020 10:11,  Premature supraventricular complexes are no longer Present  Nonspecific T wave abnormality now evident in Inferior leads  Confirmed by PATHAK MD, SATISH ([id number]) on 08/18/2020 11:01:30 AM     CBC WITH AUTOMATED DIFF    Collection Time: 08/18/20 10:20 AM   Result Value Ref Range    WBC 12.5 (H) 4.1 - 11.1 K/uL    RBC 5.71 (H) 4.10 - 5.70 M/uL    HGB 16.0 12.1 - 17.0 g/dL    HCT 47.3 36.6 - 50.3 %    MCV 82.8 80.0 - 99.0 FL    MCH 28.0 26.0 - 34.0 PG    MCHC 33.8 30.0 - 36.5 g/dL    RDW 14.2 11.5 - 14.5 %    PLATELET 221 150 - 400 K/uL    MPV 12.0 8.9 - 12.9 FL    NEUTROPHILS 83 (H) 32 - 75 %    LYMPHOCYTES 9 (L) 12 - 49 %    MONOCYTES 7 5 - 13 %    EOSINOPHILS 0 0 - 7 %    BASOPHILS 0 0 - 1 %    IMMATURE GRANULOCYTES 1 (H) 0.0 - 0.5 %    ABS. NEUTROPHILS 10.5 (H) 1.8 - 8.0 K/UL    ABS. LYMPHOCYTES 1.1 0.8 - 3.5 K/UL    ABS. MONOCYTES 0.8 0.0 - 1.0 K/UL    ABS. EOSINOPHILS 0.0 0.0 - 0.4 K/UL    ABS. BASOPHILS 0.0 0.0 - 0.1 K/UL    ABS. IMM. GRANS. 0.1 (H) 0.00 - 0.04 K/UL    DF AUTOMATED     METABOLIC PANEL, COMPREHENSIVE    Collection Time: 08/18/20 10:20 AM   Result Value Ref Range    Sodium 126 (L) 136 - 145 mmol/L    Potassium 4.6 3.5 - 5.1 mmol/L    Chloride 89 (L) 97 - 108 mmol/L    CO2 23 21 - 32 mmol/L    Anion gap 14 5 - 15 mmol/L    Glucose 273 (H) 65 - 100 mg/dL    BUN 12 6 - 20 mg/dL    Creatinine 1.42 (  H) 0.70 - 1.30 mg/dL    BUN/Creatinine ratio 8 (L) 12 - 20      GFR est AA >60 >60 ml/min/1.30m    GFR est non-AA 51 (L) >60 ml/min/1.761m   Calcium 8.8 8.5 - 10.1 mg/dL    Bilirubin, total 1.9 (H) 0.2 - 1.0 mg/dL    AST (SGOT) 347 (H) 15 - 37 U/L    ALT (SGPT) 241 (H) 12 - 78 U/L    Alk. phosphatase 300 (H) 45  - 117 U/L    Protein, total 8.9 (H) 6.4 - 8.2 g/dL    Albumin 3.7 3.5 - 5.0 g/dL    Globulin 5.2 (H) 2.0 - 4.0 g/dL    A-G Ratio 0.7 (L) 1.1 - 2.2     LIPASE    Collection Time: 08/18/20 10:20 AM   Result Value Ref Range    Lipase 1,093 (H) 73 - 393 U/L   NT-PRO BNP    Collection Time: 08/18/20 10:20 AM   Result Value Ref Range    NT pro-BNP 541 (H) <125 pg/mL   ETHYL ALCOHOL    Collection Time: 08/18/20 10:20 AM   Result Value Ref Range    ALCOHOL(ETHYL),SERUM <10 <10 mg/dL   TROPONIN-HIGH SENSITIVITY    Collection Time: 08/18/20 10:20 AM   Result Value Ref Range    Troponin-High Sensitivity 17 0 - 76 ng/L   GLUCOSE, POC    Collection Time: 08/18/20 10:34 AM   Result Value Ref Range    Glucose (POC) 241 (H) 65 - 117 mg/dL    Performed by [employer]  LACTIC ACID    Collection Time: 08/18/20 11:15 AM   Result Value Ref Range    Lactic acid 4.6 (HH) 0.4 - 2.0 mmol/L   URINALYSIS W/ RFLX MICROSCOPIC    Collection Time: 08/18/20 12:09 PM   Result Value Ref Range    Color Yellow      Appearance Clear Clear      Specific gravity 1.010 1.003 - 1.030      pH (UA) 6.0 5.0 - 8.0      Protein 30 (A) Negative mg/dL    Glucose >1,000 (A) Negative mg/dL    Ketone 15 (A) Negative mg/dL    Bilirubin Negative Negative      Blood Large (A) Negative      Urobilinogen 0.1 (L) 0.2 - 1.0 EU/dL    Nitrites Negative Negative      Leukocyte Esterase Small (A) Negative     DRUG SCREEN, URINE    Collection Time: 08/18/20 12:09 PM   Result Value Ref Range    AMPHETAMINES Negative Negative      BARBITURATES Negative Negative      Buprenorphine screen, urine Negative Negative      BENZODIAZEPINES Positive (A) Negative      COCAINE Negative Negative      METHADONE Negative Negative      Methamphetamines Negative Negative      OPIATES Negative Negative      OXYCODONE SCREEN Negative Negative      PCP(PHENCYCLIDINE) Negative Negative      PROPOXYPHENE Negative Negative      THC (TH-CANNABINOL) Negative Negative      TRICYCLICS Negative  Negative     URINE MICROSCOPIC    Collection Time: 08/18/20 12:09 PM   Result Value Ref Range    WBC 20-50 0 - 4 /hpf    RBC 0-5 0 - 5 /hpf    Epithelial  cells Few Few /lpf    Bacteria 4+ (A) Negative /hpf   LACTIC ACID    Collection Time: 08/18/20 12:55 PM   Result Value Ref Range    Lactic acid 2.8 (HH) 0.4 - 2.0 mmol/L        [place] ABD LTD   Final Result      CT ABD PELV W CONT   Final Result   Moderate acute pancreatitis with edema of the adjacent gastric wall           Vitals:    08/18/20 1502 08/18/20 1522 08/18/20 1530 08/18/20 1624   BP: (!) 180/127 (!) 189/115 (!) 189/130 (!) 180/130   Pulse: (!) 127 (!) 123 (!) 123 (!) 126   Resp: '20 16  20   ' Temp: 98.4 ??F (36.9 ??C) 98.7 ??F (37.1 ??C) 98.7 ??F (37.1 ??C) 98.6 ??F (37 ??C)   SpO2: 97% 96% 96% 97%   Weight:       Height:            Physical Exam:  Physical Exam  Vitals and nursing note reviewed.   Constitutional:       Appearance: He is ill-appearing.   HENT:      Head: Normocephalic and atraumatic.   Eyes:      General: Scleral icterus present.   Cardiovascular:      Rate and Rhythm: Normal rate and regular rhythm.   Pulmonary:      Effort: No respiratory distress.      Breath sounds: No wheezing.   Abdominal:      General: Bowel sounds are normal. There is no distension.      Palpations: Abdomen is soft.      Tenderness: There is abdominal tenderness.      Comments: Negative for cullen or turners sign   Genitourinary:     Comments: No foley present  Musculoskeletal:      Right lower leg: No edema.      Left lower leg: No edema.   Skin:     Capillary Refill: Capillary refill takes less than 2 seconds.      Coloration: Skin is not jaundiced.   Neurological:      Mental Status: He is alert and oriented to person, place, and time.   Psychiatric:      Comments: In acute pain          Assessment:     Hospital Problems  Never Reviewed          Codes Class Noted POA    Pancreatitis ICD-10-CM: K85.90  ICD-9-CM: 621.3  08/18/2020 Unknown              Plan:      Pancreatitis  CT abdomen showed moderate acute pancreatitis with edema of the adjacent gastric wall  Lipase elevated will trend  Start on IVF and pain control  GI and general surgery consulted    Sepsis, POA with tachycardia, WBC elevated and lactic  Continue on zosyn and IVF  6/23 blood: pending    UTI  6/23 urine: pending  Started on Zosyn     Elevated transamidase  Hepatitis panel pending    Diabetes Mellitus, type II  A1c 11.6  Hold 17 units of NPH while n.p.o.   SSI as needed    Hypertension  Continue on metoprolol and lisinopril    Hyperlipidemia  Hold pravastatin    AKI on CKD  Start on IVF  Monitor    Alcohol use  Start on librium  CIWA protocol in place  Monitor    DVT Prophylaxis: lovenox  GI Prophylaxis: pepcid  CODE STATUS: FULL    Family at bedside    Total Time spent in direct and indirect care including assessment review of labs and coordination of services and consultations: Greater than 75 minutes.    Signed By: Alain Honey, PA     August 18, 2020

## 2020-08-18 NOTE — ED Provider Notes (Signed)
ED Provider Notes by Mirian Mo, DO at 08/18/20 1239                Author: Mirian Mo, DO  Service: --  Author Type: Physician       Filed: 08/18/20 1247  Date of Service: 08/18/20 1239  Status: Signed          Editor: Mirian Mo, DO (Physician)            Procedure Orders        1. Critical Care [[id number]] ordered by Mirian Mo, DO                              EMERGENCY DEPARTMENT HISTORY AND PHYSICAL EXAM           Date: 08/18/2020   Patient Name: Antonio OPDAHL Sr.        History of Presenting Illness          Chief Complaint       Patient presents with        ?  Abdominal Pain     ?  Nausea        ?  Vomiting           History Provided By:       HPI: Willette Pa., is  a very pleasant 60 y.o. male presenting  to the ED with a chief complaint of abdominal pain, nausea and vomiting. Resent onset of symptoms yesterday. Pain is getting worse. Achy in epigastric region. He has not been able to eat or drink anything secondary to the nausea. He uses alcohol daily  and states his last drink was yesterday.       The patient denies any other symptoms at this time.      PCP: Other, Phys, MD        No current facility-administered medications on file prior to encounter.          Current Outpatient Medications on File Prior to Encounter          Medication  Sig  Dispense  Refill           ?  chlordiazePOXIDE (LIBRIUM) 10 mg capsule  Take 1 Capsule by mouth three (3) times daily. Max Daily Amount: 30 mg.  21 Capsule  1     ?  sucralfate (CARAFATE) 1 gram tablet  Take 1 Tablet by mouth Before breakfast, lunch, dinner and at bedtime.  21 Tablet  1     ?  SITagliptin (Januvia) 50 mg tablet  Take 50 mg by mouth daily.         ?  insulin regular (NOVOLIN R, HUMULIN R) 100 unit/mL injection  by SubCUTAneous route. On a scale insulin         ?  omeprazole (PRILOSEC) 40 mg capsule  Take 40 mg by mouth daily.         ?  ramipriL (ALTACE) 10 mg capsule  Take 10 mg by mouth daily.         ?   metoprolol tartrate (LOPRESSOR) 25 mg tablet  Take 25 mg by mouth two (2) times a day.         ?  rosuvastatin (Crestor) 40 mg tablet  Take 40 mg by mouth nightly.         ?  amLODIPine (NORVASC) 10 mg tablet  Take 10 mg by mouth.         ?  gabapentin (NEURONTIN) 600 mg tablet  Take 600 mg by mouth At bedtime.         ?  DULoxetine (CYMBALTA) 60 mg capsule  Take  by mouth.               ?  insulin nph-regular human rec (NovoLIN 70-30 FlexPen U-100) 100 unit/mL (70-30) inpn  Inject 48 units qAM, 28 units qhs  2 Each  0             Past History        Past Medical History:     Past Medical History:        Diagnosis  Date         ?  Diabetes ([place])       ?  High cholesterol           ?  Hypertension             Past Surgical History:     Past Surgical History:         Procedure  Laterality  Date          ?  HX CHOLECYSTECTOMY               Family History:   History reviewed. No pertinent family history.      Social History:     Social History          Tobacco Use         ?  Smoking status:  Never Smoker     ?  Smokeless tobacco:  Never Used       Vaping Use         ?  Vaping Use:  Never used       Substance Use Topics         ?  Alcohol use:  Yes              Alcohol/week:  74.0 standard drinks         Types:  74 Cans of beer per week         ?  Drug use:  Never           Allergies:   No Known Allergies           Review of Systems        Review of Systems    Constitutional: Positive for activity change, appetite change  and fatigue. Negative for chills and fever.    HENT: Negative for congestion and sore throat.     Eyes: Negative for photophobia and visual disturbance.    Respiratory: Negative for cough, shortness of breath and wheezing.     Cardiovascular: Negative for chest pain, palpitations and leg swelling.    Gastrointestinal: Positive for abdominal pain, nausea  and vomiting. Negative for diarrhea.    Endocrine: Negative for cold intolerance and heat intolerance.    Musculoskeletal: Negative for gait problem  and joint swelling.    Skin: Negative for color change and rash.    Neurological: Negative for dizziness and headaches.            Physical Exam        Physical Exam   Constitutional :        General: He is not in acute distress.     Appearance: Normal appearance. He is ill-appearing and  toxic-appearing.    HENT:       Head: Normocephalic and atraumatic.      Right Ear:  External ear normal.      Left Ear: External ear normal.      Mouth/Throat:      Pharynx: Oropharynx is clear.      Comments:  dry  Eyes :       Extraocular Movements: Extraocular movements intact.      Conjunctiva/sclera: Conjunctivae normal.   Cardiovascular:       Rate and Rhythm: Regular rhythm. Tachycardia present.      Pulses: Normal pulses.      Heart sounds: Normal heart sounds.   Pulmonary:       Effort: Pulmonary effort is normal. No respiratory distress.      Breath sounds: Normal breath sounds. No wheezing, rhonchi or rales.    Abdominal:      General: There is no distension.      Palpations: Abdomen is soft.      Tenderness: There is no right CVA tenderness or left CVA tenderness.      Comments:  Diffuse abdominal tenderness, worse in epigastrium, no rebound or guarding     Musculoskeletal:          General: No deformity.      Cervical back: Normal range of motion and neck supple.      Right lower leg: No edema.      Left lower leg: No edema.    Skin:      Capillary Refill: Capillary refill takes less than 2 seconds.      Findings: No erythema or rash.    Neurological:       General: No focal deficit present.      Mental Status: He is alert and oriented to person, place, and time.      Gait: Gait normal.   Psychiatric:         Mood and Affect: Mood normal.         Behavior: Behavior normal.               Lab and Diagnostic Study Results        Labs -         Recent Results (from the past 12 hour(s))     EKG, 12 LEAD, INITIAL          Collection Time: 08/18/20 10:17 AM         Result  Value  Ref Range            Ventricular Rate  142   BPM       Atrial Rate  142  BPM       P-R Interval  140  ms       QRS Duration  70  ms       Q-T Interval  272  ms       QTC Calculation (Bezet)  418  ms       Calculated P Axis  66  degrees       Calculated R Axis  40  degrees       Calculated T Axis  145  degrees       Diagnosis                 Sinus tachycardia   ST & T wave abnormality, consider inferolateral ischemia   Abnormal ECG   When compared with ECG of 03-Aug-2020 10:11,   Premature supraventricular complexes are no longer Present   Nonspecific T wave abnormality now evident in Inferior leads   Confirmed by [place] MD, SATISH ([id number]) on  08/18/2020 11:01:30 AM          CBC WITH AUTOMATED DIFF          Collection Time: 08/18/20 10:20 AM         Result  Value  Ref Range            WBC  12.5 (H)  4.1 - 11.1 K/uL       RBC  5.71 (H)  4.10 - 5.70 M/uL       HGB  16.0  12.1 - 17.0 g/dL       HCT  47.3  36.6 - 50.3 %       MCV  82.8  80.0 - 99.0 FL       MCH  28.0  26.0 - 34.0 PG       MCHC  33.8  30.0 - 36.5 g/dL       RDW  14.2  11.5 - 14.5 %       PLATELET  221  150 - 400 K/uL       MPV  12.0  8.9 - 12.9 FL       NEUTROPHILS  83 (H)  32 - 75 %       LYMPHOCYTES  9 (L)  12 - 49 %       MONOCYTES  7  5 - 13 %       EOSINOPHILS  0  0 - 7 %       BASOPHILS  0  0 - 1 %       IMMATURE GRANULOCYTES  1 (H)  0.0 - 0.5 %       ABS. NEUTROPHILS  10.5 (H)  1.8 - 8.0 K/UL       ABS. LYMPHOCYTES  1.1  0.8 - 3.5 K/UL       ABS. MONOCYTES  0.8  0.0 - 1.0 K/UL       ABS. EOSINOPHILS  0.0  0.0 - 0.4 K/UL       ABS. BASOPHILS  0.0  0.0 - 0.1 K/UL       ABS. IMM. GRANS.  0.1 (H)  0.00 - 0.04 K/UL       DF  AUTOMATED          METABOLIC PANEL, COMPREHENSIVE          Collection Time: 08/18/20 10:20 AM         Result  Value  Ref Range            Sodium  126 (L)  136 - 145 mmol/L       Potassium  4.6  3.5 - 5.1 mmol/L       Chloride  89 (L)  97 - 108 mmol/L       CO2  23  21 - 32 mmol/L       Anion gap  14  5 - 15 mmol/L       Glucose  273 (H)  65 - 100 mg/dL       BUN  12  6 - 20 mg/dL        Creatinine  1.42 (H)  0.70 - 1.30 mg/dL       BUN/Creatinine ratio  8 (L)  12 - 20         GFR est AA  >60  >60 ml/min/1.3m       GFR est non-AA  51 (L)  >60 ml/min/1.735m      Calcium  8.8  8.5 - 10.1 mg/dL       Bilirubin, total  1.9 (H)  0.2 - 1.0 mg/dL       AST (SGOT)  347 (H)  15 - 37 U/L       ALT (SGPT)  241 (H)  12 - 78 U/L       Alk. phosphatase  300 (H)  45 - 117 U/L       Protein, total  8.9 (H)  6.4 - 8.2 g/dL       Albumin  3.7  3.5 - 5.0 g/dL       Globulin  5.2 (H)  2.0 - 4.0 g/dL       A-G Ratio  0.7 (L)  1.1 - 2.2         LIPASE          Collection Time: 08/18/20 10:20 AM         Result  Value  Ref Range            Lipase  1,093 (H)  73 - 393 U/L       NT-PRO BNP          Collection Time: 08/18/20 10:20 AM         Result  Value  Ref Range            NT pro-BNP  541 (H)  <125 pg/mL       ETHYL ALCOHOL          Collection Time: 08/18/20 10:20 AM         Result  Value  Ref Range            ALCOHOL(ETHYL),SERUM  <10  <10 mg/dL       TROPONIN-HIGH SENSITIVITY          Collection Time: 08/18/20 10:20 AM         Result  Value  Ref Range            Troponin-High Sensitivity  17  0 - 76 ng/L       GLUCOSE, POC          Collection Time: 08/18/20 10:34 AM         Result  Value  Ref Range            Glucose (POC)  241 (H)  65 - 117 mg/dL       Performed by  Sabas Sous         LACTIC ACID          Collection Time: 08/18/20 11:15 AM         Result  Value  Ref Range            Lactic acid  4.6 (HH)  0.4 - 2.0 mmol/L       URINALYSIS W/ RFLX MICROSCOPIC          Collection Time: 08/18/20 12:09 PM         Result  Value  Ref Range            Color  Yellow          Appearance  Clear  Clear         Specific gravity  1.010  1.003 - 1.030         pH (UA)  6.0  5.0 - 8.0         Protein  30 (A)  Negative mg/dL       Glucose  >1,000 (A)  Negative mg/dL       Ketone  15 (A)  Negative mg/dL       Bilirubin  Negative  Negative         Blood  Large (A)  Negative         Urobilinogen  0.1 (L)  0.2 - 1.0 EU/dL        Nitrites  Negative  Negative         Leukocyte Esterase  Small (A)  Negative         DRUG SCREEN, URINE          Collection Time: 08/18/20 12:09 PM         Result  Value  Ref Range            AMPHETAMINES  Negative  Negative         BARBITURATES  Negative  Negative         Buprenorphine screen, urine  Negative  Negative         BENZODIAZEPINES  Positive (A)  Negative         COCAINE  Negative  Negative         METHADONE  Negative  Negative         Methamphetamines  Negative  Negative         OPIATES  Negative  Negative         OXYCODONE SCREEN  Negative  Negative         PCP(PHENCYCLIDINE)  Negative  Negative         PROPOXYPHENE  Negative  Negative         THC (TH-CANNABINOL)  Negative  Negative         TRICYCLICS  Negative  Negative         URINE MICROSCOPIC          Collection Time: 08/18/20 12:09 PM         Result  Value  Ref Range            WBC  20-50  0 - 4 /hpf       RBC  0-5  0 - 5 /hpf       Epithelial cells  Few  Few /lpf            Bacteria  4+ (A)  Negative /hpf           Radiologic Studies -    '@lastxrresult' @     CT Results   (Last 48 hours)                                    08/18/20 1142    CT ABD PELV W CONT  Final result            Impression:    Moderate acute pancreatitis with edema of the adjacent gastric wall                       Narrative:    CT dose reduction was achieved through use of a standardized protocol tailored      for this examination and automatic exposure control for dose modulation.             Contrast study shows clear lung bases             Borderline fatty liver. Spleen, pancreas are normal. Moderate peripancreatic fat  edema and free fluid throughout the region. It mostly surrounds the head and      neck. Mild edema of the adjacent lower gastric wall. Gallbladder is out. No      biliary dilatation. Normal adrenals and kidneys. No small bowel or vascular      abnormality, lymphadenopathy or fluid collection. Free fluid extends down the      central mesenteric, not quite  to the pelvic brim             Normal prostate, bladder, colon and appendix. No mesenteric fat edema or      abdominal wall hernia             Moderate lumbar facet DJD                                 CXR Results   (Last 48 hours)          None                       Medical Decision Making     - I am the first provider for this patient.   - I reviewed the vital signs, available nursing notes, past medical history, past surgical history, family history and social history.   - Initial assessment performed. The patients presenting problems have been discussed, and they are in agreement with the care plan formulated and outlined with them.  I have encouraged them to ask questions as they arise throughout their visit.      Vital Signs-Reviewed the patient's vital signs.   Patient Vitals for the past 12 hrs:            Temp  Pulse  Resp  BP  SpO2            08/18/20 1216  98.5 ??F (36.9 ??C)  (!) 130  20  (!) 211/95  99 %            08/18/20 1049  --  (!) 120  20  (!) 184/126  100 %            08/18/20 1011  97.5 ??F (36.4 ??C)  (!) 145  18  (!) 163/105  99 %              ED Course/ Provider Notes (Medical Decision Making):       Patient presented to the emergency department with the aforementioned chief complaint.   On examination the patient is ill appearing.  Vitals were reviewed per above.  Patient noted to be dehydrated on exam.  Patient with leukocytosis and lactic acidosis.  Blood cultures ordered and patient given broad-spectrum antibiotics given concern for  sepsis.  CT abdomen pelvis as well as right upper quadrant ultrasound resulted and consistent with acute pancreatitis.  Patient also noted to have UTI.  Upon reexamination, patient is still uncomfortable but feeling better.  Case discussed with hospitalist  Dr. Celine Ahr who accepted admission                 Procedures     Medical Decision Makingedical Decision Making   Performed by: Mirian Mo, DO   Critical Care   Performed by: Mirian Mo, DO    Authorized by:  Mirian Mo, DO       Critical care provider statement:      Critical care time (minutes):  45  Critical care was necessary to treat or prevent imminent or life-threatening deterioration of the following conditions: dehydration      Critical care was time spent personally by me on the following activities:  Ordering and performing treatments and interventions, ordering and review of laboratory studies, ordering and review  of radiographic studies and re-evaluation of patient's condition        None            Disposition     Disposition:    Admit         Diagnosis        Clinical Impression:        1.  Acute pancreatitis, unspecified complication status, unspecified pancreatitis type      2.  Dehydration      3.  Alcohol use         4.  AKI (acute kidney injury) [facility])            Attestations:      Mirian Mo, DO      Please note that this dictation was completed with Dragon, the computer voice recognition software.  Quite often unanticipated grammatical, syntax, homophones, and other interpretive errors are inadvertently  transcribed by the computer software.  Please disregard these errors.  Please excuse any errors that have escaped final proofreading.  Thank you.

## 2020-08-18 NOTE — Consults (Signed)
Patient with alcohol pancreatitis, prior cholecystectomy. No surgical issue at present. Please re consult as needed.

## 2020-08-18 NOTE — Progress Notes (Signed)
Problem: Diabetes Self-Management  Goal: *Disease process and treatment process  Description: Define diabetes and identify own type of diabetes; list 3 options for treating diabetes.  Outcome: Progressing Towards Goal  Goal: *Incorporating nutritional management into lifestyle  Description: Describe effect of type, amount and timing of food on blood glucose; list 3 methods for planning meals.  Outcome: Progressing Towards Goal  Goal: *Incorporating physical activity into lifestyle  Description: State effect of exercise on blood glucose levels.  Outcome: Progressing Towards Goal  Goal: *Developing strategies to promote health/change behavior  Description: Define the ABC's of diabetes; identify appropriate screenings, schedule and personal plan for screenings.  Outcome: Progressing Towards Goal  Goal: *Using medications safely  Description: State effect of diabetes medications on diabetes; name diabetes medication taking, action and side effects.  Outcome: Progressing Towards Goal  Goal: *Monitoring blood glucose, interpreting and using results  Description: Identify recommended blood glucose targets  and personal targets.  Outcome: Progressing Towards Goal  Goal: *Prevention, detection, treatment of acute complications  Description: List symptoms of hyper- and hypoglycemia; describe how to treat low blood sugar and actions for lowering  high blood glucose level.  Outcome: Progressing Towards Goal  Goal: *Prevention, detection and treatment of chronic complications  Description: Define the natural course of diabetes and describe the relationship of blood glucose levels to long term complications of diabetes.  Outcome: Progressing Towards Goal  Goal: *Developing strategies to address psychosocial issues  Description: Describe feelings about living with diabetes; identify support needed and support network  Outcome: Progressing Towards Goal  Goal: *Insulin pump training  Outcome: Progressing Towards Goal  Goal: *Sick  day guidelines  Outcome: Progressing Towards Goal  Goal: *Patient Specific Goal (EDIT GOAL, INSERT TEXT)  Outcome: Progressing Towards Goal

## 2020-08-18 NOTE — Progress Notes (Unsigned)
Admission skin   Skin clean dry and intact, IV in left AC.

## 2020-08-18 NOTE — Progress Notes (Signed)
Problem: Diabetes Self-Management  Goal: *Disease process and treatment process  Description: Define diabetes and identify own type of diabetes; list 3 options for treating diabetes.  Outcome: Progressing Towards Goal  Goal: *Incorporating nutritional management into lifestyle  Description: Describe effect of type, amount and timing of food on blood glucose; list 3 methods for planning meals.  Outcome: Progressing Towards Goal  Goal: *Incorporating physical activity into lifestyle  Description: State effect of exercise on blood glucose levels.  Outcome: Progressing Towards Goal  Goal: *Developing strategies to promote health/change behavior  Description: Define the ABC's of diabetes; identify appropriate screenings, schedule and personal plan for screenings.  Outcome: Progressing Towards Goal  Goal: *Using medications safely  Description: State effect of diabetes medications on diabetes; name diabetes medication taking, action and side effects.  Outcome: Progressing Towards Goal  Goal: *Monitoring blood glucose, interpreting and using results  Description: Identify recommended blood glucose targets  and personal targets.  Outcome: Progressing Towards Goal  Goal: *Prevention, detection, treatment of acute complications  Description: List symptoms of hyper- and hypoglycemia; describe how to treat low blood sugar and actions for lowering  high blood glucose level.  Outcome: Progressing Towards Goal  Goal: *Prevention, detection and treatment of chronic complications  Description: Define the natural course of diabetes and describe the relationship of blood glucose levels to long term complications of diabetes.  Outcome: Progressing Towards Goal  Goal: *Developing strategies to address psychosocial issues  Description: Describe feelings about living with diabetes; identify support needed and support network  Outcome: Progressing Towards Goal  Goal: *Insulin pump training  Outcome: Progressing Towards Goal  Goal: *Sick  day guidelines  Outcome: Progressing Towards Goal  Goal: *Patient Specific Goal (EDIT GOAL, INSERT TEXT)  Outcome: Progressing Towards Goal

## 2020-08-18 NOTE — ED Notes (Signed)
Antonio Melendez, friend, can be reached at [phone].

## 2020-08-18 NOTE — Progress Notes (Signed)
Lisinopril 40 mg was therapeutically interchanged for Altace 10 mg per the P&T Committee approved Production assistant, radio.    Olegario Messier MS, Pharmacist  08/18/2020 6:49 PM

## 2020-08-18 NOTE — ED Notes (Signed)
Abdominal pain, n/v/d began yesterday. Afebrile. Denies CP or SOB. HR 140's, will direct bed and obtain EKG

## 2020-08-19 LAB — HEPATITIS PANEL, ACUTE
HCV Ab: 0.07 Index
Hep A IgM: NONREACTIVE
Hep B Core Ab, IgM: NONREACTIVE
Hep B surface Ag Interp.: NEGATIVE
Hep C virus Ab Interp.: NONREACTIVE
Hepatitis A, IgM: NONREACTIVE
Hepatitis B Surface Ag: <0.1 Index
Hepatitis B core, IgM: NONREACTIVE
Hepatitis B surface Ag: <0.1 Index
Hepatitis C Ab: NONREACTIVE
Hepatitis C virus Ab: 0.07 Index

## 2020-08-19 LAB — METABOLIC PANEL, COMPREHENSIVE
A-G Ratio: 0.6 — ABNORMAL LOW (ref 1.1–2.2)
ALT (SGPT): 124 U/L — ABNORMAL HIGH (ref 12–78)
AST (SGOT): 111 U/L — ABNORMAL HIGH (ref 15–37)
Albumin: 2.8 g/dL — ABNORMAL LOW (ref 3.5–5.0)
Alk. phosphatase: 200 U/L — ABNORMAL HIGH (ref 45–117)
Anion gap: 11 mmol/L (ref 5–15)
BUN/Creatinine ratio: 8 — ABNORMAL LOW (ref 12–20)
BUN: 8 mg/dL (ref 6–20)
Bilirubin, total: 1.6 mg/dL — ABNORMAL HIGH (ref 0.2–1.0)
CO2: 22 mmol/L (ref 21–32)
Calcium: 8.1 mg/dL — ABNORMAL LOW (ref 8.5–10.1)
Chloride: 102 mmol/L (ref 97–108)
Creatinine: 1.03 mg/dL (ref 0.70–1.30)
GFR est AA: >60 mL/min/{1.73_m2} (ref >60)
GFR est non-AA: >60 mL/min/{1.73_m2} (ref >60)
Globulin: 4.4 g/dL — ABNORMAL HIGH (ref 2.0–4.0)
Glucose: 195 mg/dL — ABNORMAL HIGH (ref 65–100)
Potassium: 2.9 mmol/L — ABNORMAL LOW (ref 3.5–5.1)
Protein, total: 7.2 g/dL (ref 6.4–8.2)
Sodium: 135 mmol/L — ABNORMAL LOW (ref 136–145)

## 2020-08-19 LAB — CBC WITH AUTOMATED DIFF
ABS. BASOPHILS: 0 10*3/uL (ref 0.0–0.1)
ABS. EOSINOPHILS: 0.1 10*3/uL (ref 0.0–0.4)
ABS. IMM. GRANS.: 0 10*3/uL
ABS. LYMPHOCYTES: 2.1 10*3/uL (ref 0.8–3.5)
ABS. MONOCYTES: 1.3 10*3/uL — ABNORMAL HIGH (ref 0.0–1.0)
ABS. NEUTROPHILS: 9.2 10*3/uL — ABNORMAL HIGH (ref 1.8–8.0)
ABSOLUTE NRBC: 0 10*3/uL (ref 0.00–0.01)
BASOPHILS: 0 % (ref 0–1)
EOSINOPHILS: 1 % (ref 0–7)
HCT: 39.7 % (ref 36.6–50.3)
HGB: 13.3 g/dL (ref 12.1–17.0)
IMMATURE GRANULOCYTES: 0 %
LYMPHOCYTES: 16 % (ref 12–49)
MCH: 28 PG (ref 26.0–34.0)
MCHC: 33.5 g/dL (ref 30.0–36.5)
MCV: 83.6 FL (ref 80.0–99.0)
MONOCYTES: 10 % (ref 5–13)
MPV: 12.1 FL (ref 8.9–12.9)
NEUTROPHILS: 71 % (ref 32–75)
NRBC: 0 PER 100 WBC
OTHER CELL: 2 %
PLATELET: 170 10*3/uL (ref 150–400)
RBC: 4.75 M/uL (ref 4.10–5.70)
RDW: 14.9 % — ABNORMAL HIGH (ref 11.5–14.5)
WBC: 13 10*3/uL — ABNORMAL HIGH (ref 4.1–11.1)

## 2020-08-19 LAB — GLUCOSE, POC
Glucose (POC): 229 mg/dL — ABNORMAL HIGH (ref 65–117)
Glucose (POC): 190 mg/dL — ABNORMAL HIGH (ref 65–117)
Glucose (POC): 160 mg/dL — ABNORMAL HIGH (ref 65–117)
Glucose (POC): 149 mg/dL — ABNORMAL HIGH (ref 65–117)

## 2020-08-19 LAB — LACTIC ACID
Lactic Acid: 2.6 mmol/L (ref 0.4–2.0)
Lactic acid: 2.6 mmol/L — CR (ref 0.4–2.0)

## 2020-08-19 LAB — PROCALCITONIN
Procalcitonin: 1.48 ng/mL — ABNORMAL HIGH
Procalcitonin: 1.48 ng/mL — ABNORMAL HIGH
Procalcitonin: 1.61 ng/mL — ABNORMAL HIGH
Procalcitonin: 1.61 ng/mL — ABNORMAL HIGH

## 2020-08-19 LAB — LIPASE
Lipase: 697 U/L — ABNORMAL HIGH (ref 73–393)
Lipase: 697 U/L — ABNORMAL HIGH (ref 73–393)
Lipase: 424 U/L — ABNORMAL HIGH (ref 73–393)
Lipase: 424 U/L — ABNORMAL HIGH (ref 73–393)

## 2020-08-19 LAB — C REACTIVE PROTEIN, QT: C-Reactive protein: 29.8 mg/dL — ABNORMAL HIGH (ref 0.00–0.60)

## 2020-08-19 LAB — CBC WITH AUTO DIFFERENTIAL
Basophils %: 0 % (ref 0–1)
Basophils Absolute: 0 10*3/uL (ref 0.0–0.1)
Eosinophils %: 1 % (ref 0–7)
Eosinophils Absolute: 0.1 10*3/uL (ref 0.0–0.4)
Granulocyte Absolute Count: 0 10*3/uL
Hematocrit: 39.7 % (ref 36.6–50.3)
Hemoglobin: 13.3 g/dL (ref 12.1–17.0)
Immature Granulocytes: 0 %
Lymphocytes %: 16 % (ref 12–49)
Lymphocytes Absolute: 2.1 10*3/uL (ref 0.8–3.5)
MCH: 28 PG (ref 26.0–34.0)
MCHC: 33.5 g/dL (ref 30.0–36.5)
MCV: 83.6 FL (ref 80.0–99.0)
MPV: 12.1 FL (ref 8.9–12.9)
Monocytes %: 10 % (ref 5–13)
Monocytes Absolute: 1.3 10*3/uL — ABNORMAL HIGH (ref 0.0–1.0)
NRBC Absolute: 0 10*3/uL (ref 0.00–0.01)
Neutrophils %: 71 % (ref 32–75)
Neutrophils Absolute: 9.2 10*3/uL — ABNORMAL HIGH (ref 1.8–8.0)
Nucleated RBCs: 0 PER 100 WBC
Other Cell: 2 %
Platelets: 170 10*3/uL (ref 150–400)
RBC: 4.75 M/uL (ref 4.10–5.70)
RDW: 14.9 % — ABNORMAL HIGH (ref 11.5–14.5)
WBC: 13 10*3/uL — ABNORMAL HIGH (ref 4.1–11.1)

## 2020-08-19 LAB — COMPREHENSIVE METABOLIC PANEL
ALT: 124 U/L — ABNORMAL HIGH (ref 12–78)
AST: 111 U/L — ABNORMAL HIGH (ref 15–37)
Albumin/Globulin Ratio: 0.6 — ABNORMAL LOW (ref 1.1–2.2)
Albumin: 2.8 g/dL — ABNORMAL LOW (ref 3.5–5.0)
Alkaline Phosphatase: 200 U/L — ABNORMAL HIGH (ref 45–117)
Anion Gap: 11 mmol/L (ref 5–15)
BUN: 8 mg/dL (ref 6–20)
Bun/Cre Ratio: 8 — ABNORMAL LOW (ref 12–20)
CO2: 22 mmol/L (ref 21–32)
Calcium: 8.1 mg/dL — ABNORMAL LOW (ref 8.5–10.1)
Chloride: 102 mmol/L (ref 97–108)
Creatinine: 1.03 mg/dL (ref 0.70–1.30)
EGFR IF NonAfrican American: >60 mL/min/{1.73_m2} (ref >60)
GFR African American: >60 mL/min/{1.73_m2} (ref >60)
Globulin: 4.4 g/dL — ABNORMAL HIGH (ref 2.0–4.0)
Glucose: 195 mg/dL — ABNORMAL HIGH (ref 65–100)
Potassium: 2.9 mmol/L — ABNORMAL LOW (ref 3.5–5.1)
Sodium: 135 mmol/L — ABNORMAL LOW (ref 136–145)
Total Bilirubin: 1.6 mg/dL — ABNORMAL HIGH (ref 0.2–1.0)
Total Protein: 7.2 g/dL (ref 6.4–8.2)

## 2020-08-19 LAB — POCT GLUCOSE
POC Glucose: 229 mg/dL — ABNORMAL HIGH (ref 65–117)
POC Glucose: 190 mg/dL — ABNORMAL HIGH (ref 65–117)
POC Glucose: 160 mg/dL — ABNORMAL HIGH (ref 65–117)
POC Glucose: 149 mg/dL — ABNORMAL HIGH (ref 65–117)

## 2020-08-19 LAB — C-REACTIVE PROTEIN: CRP: 29.8 mg/dL — ABNORMAL HIGH (ref 0.00–0.60)

## 2020-08-19 MED ORDER — POTASSIUM CHLORIDE SR 10 MEQ TAB
10 mEq | Freq: Every day | ORAL | Status: DC
Start: 2020-08-19 — End: 2020-08-23
  Administered 2020-08-20 – 2020-08-23 (×4): via ORAL

## 2020-08-19 MED ORDER — BACLOFEN 10 MG TAB
10 mg | Freq: Three times a day (TID) | ORAL | Status: DC
Start: 2020-08-19 — End: 2020-08-26
  Administered 2020-08-19 – 2020-08-26 (×20): via ORAL

## 2020-08-19 MED ORDER — PROCHLORPERAZINE EDISYLATE 5 MG/ML INJECTION
5 mg/mL | INTRAMUSCULAR | Status: DC | PRN
Start: 2020-08-19 — End: 2020-08-26
  Administered 2020-08-19 – 2020-08-24 (×9): via INTRAVENOUS

## 2020-08-19 MED ORDER — HYDROMORPHONE 1 MG/ML INJECTION SOLUTION
1 mg/mL | INTRAMUSCULAR | Status: AC | PRN
Start: 2020-08-19 — End: 2020-08-21
  Administered 2020-08-19 – 2020-08-21 (×7): via INTRAVENOUS

## 2020-08-19 MED ORDER — HYDROMORPHONE 1 MG/ML INJECTION SOLUTION
1 mg/mL | INTRAMUSCULAR | Status: DC | PRN
Start: 2020-08-19 — End: 2020-08-19

## 2020-08-19 MED ORDER — POTASSIUM CHLORIDE 10 MEQ/100 ML IV PIGGY BACK
10 mEq/0 mL | INTRAVENOUS | Status: AC
Start: 2020-08-19 — End: 2020-08-19
  Administered 2020-08-19 (×2): via INTRAVENOUS

## 2020-08-19 MED ORDER — PHARMACY VANCOMYCIN NOTE
Status: DC
Start: 2020-08-19 — End: 2020-08-21

## 2020-08-19 MED ORDER — VANCOMYCIN 1,000 MG IV SOLR
1000 mg | Freq: Two times a day (BID) | INTRAVENOUS | Status: DC
Start: 2020-08-19 — End: 2020-08-20
  Administered 2020-08-19 – 2020-08-20 (×2): via INTRAVENOUS

## 2020-08-19 MED ORDER — KETOROLAC TROMETHAMINE 30 MG/ML INJECTION
30 mg/mL (1 mL) | Freq: Four times a day (QID) | INTRAMUSCULAR | Status: AC | PRN
Start: 2020-08-19 — End: 2020-08-24
  Administered 2020-08-19 – 2020-08-23 (×11): via INTRAVENOUS

## 2020-08-19 MED ORDER — PHARMACY VANCOMYCIN NOTE
Freq: Once | Status: AC
Start: 2020-08-19 — End: 2020-08-20
  Administered 2020-08-20: 20:00:00

## 2020-08-19 MED FILL — BACLOFEN 10 MG TAB: 10 mg | ORAL | Qty: 1

## 2020-08-19 MED FILL — CHLORDIAZEPOXIDE 10 MG CAP: 10 mg | ORAL | Qty: 1

## 2020-08-19 MED FILL — KETOROLAC TROMETHAMINE 30 MG/ML INJECTION: 30 mg/mL (1 mL) | INTRAMUSCULAR | Qty: 1

## 2020-08-19 MED FILL — HUMALOG U-100 INSULIN 100 UNIT/ML SUBCUTANEOUS SOLUTION: 100 unit/mL | SUBCUTANEOUS | Qty: 3

## 2020-08-19 MED FILL — LISINOPRIL 40 MG TAB: 40 mg | ORAL | Qty: 1

## 2020-08-19 MED FILL — VANCOMYCIN 1,000 MG IV SOLR: 1000 mg | INTRAVENOUS | Qty: 1000

## 2020-08-19 MED FILL — HUMALOG U-100 INSULIN 100 UNIT/ML SUBCUTANEOUS SOLUTION: 100 unit/mL | SUBCUTANEOUS | Qty: 4

## 2020-08-19 MED FILL — PIPERACILLIN-TAZOBACTAM 3.375 GRAM IV SOLR: 3.375 gram | INTRAVENOUS | Qty: 3.38

## 2020-08-19 MED FILL — MORPHINE 2 MG/ML INJECTION: 2 mg/mL | INTRAMUSCULAR | Qty: 1

## 2020-08-19 MED FILL — FAMOTIDINE (PF) 20 MG/2 ML IV: 20 mg/2 mL | INTRAVENOUS | Qty: 2

## 2020-08-19 MED FILL — POTASSIUM CHLORIDE 10 MEQ/100 ML IV PIGGY BACK: 10 mEq/0 mL | INTRAVENOUS | Qty: 100

## 2020-08-19 MED FILL — PHARMACY VANCOMYCIN NOTE: Qty: 1

## 2020-08-19 MED FILL — PROCHLORPERAZINE EDISYLATE 5 MG/ML INJECTION: 5 mg/mL | INTRAMUSCULAR | Qty: 2

## 2020-08-19 MED FILL — ONDANSETRON (PF) 4 MG/2 ML INJECTION: 4 mg/2 mL | INTRAMUSCULAR | Qty: 2

## 2020-08-19 MED FILL — HYDRALAZINE 20 MG/ML IJ SOLN: 20 mg/mL | INTRAMUSCULAR | Qty: 1

## 2020-08-19 MED FILL — HUMULIN N NPH U-100 INSULIN (ISOPHANE SUSP) 100 UNIT/ML SUBCUTANEOUS: 100 unit/mL | SUBCUTANEOUS | Qty: 17

## 2020-08-19 MED FILL — ENOXAPARIN 40 MG/0.4 ML SUB-Q SYRINGE: 40 mg/0.4 mL | SUBCUTANEOUS | Qty: 0.4

## 2020-08-19 MED FILL — HYDROMORPHONE (PF) 1 MG/ML IJ SOLN: 1 mg/mL | INTRAMUSCULAR | Qty: 1

## 2020-08-19 MED FILL — METOPROLOL TARTRATE 25 MG TAB: 25 mg | ORAL | Qty: 1

## 2020-08-19 MED FILL — LORAZEPAM 2 MG/ML IJ SOLN: 2 mg/mL | INTRAMUSCULAR | Qty: 1

## 2020-08-19 NOTE — Consults (Signed)
Gastroenterology Consult     Referring Physician: Other, Phys, MD     Consult Date: 08/19/2020     Subjective:     Chief Complaint: Abdominal pain, nausea, and vomiting.    History of Present Illness: Antonio Melendez. is a 60 y.o. male who is seen in consultation for Pancreatitis. Antonio Melendez presented to the ED with complaints of abdominal pain, nausea, and vomiting. He reports his abdominal pain is on and off. He reports he has been drinking about a 12 pack of beer or bottles of alcohol nearly every day for the past 3 weeks. He denies fever, hematemesis, or hemoptysis. He denies tobacco use.His last alcoholic drink was on Tuesday. He does have abdominal tenderness with light palpation. He reports frequent hiccups. He has a past medical history significant for hypertension, type 2 diabetes, insulin dependent, and hyperlipidemia. His lipase on admission was over 1,000 currently it is 424. He is NPO with IV hydration. Potassium low at 2.9 this morning, he was given IV potassium. Total bilirubin 1.6, ALT 124, AST 111, Alk Phosphatase 200. CT abdomen shows moderate acute pancreatitis with edema of the adjacent gastric wall and borderline fatty liver. His acute hepatitis panel is negative.     CT abdomen 08/18/20: Contrast study shows clear lung bases  ??Borderline fatty liver. Spleen, pancreas are normal. Moderate peripancreatic fat edema and free fluid throughout the region. It mostly surrounds the head and  neck. Mild edema of the adjacent lower gastric wall. Gallbladder is out. No  biliary dilatation. Normal adrenals and kidneys. No small bowel or vascular  abnormality, lymphadenopathy or fluid collection. Free fluid extends down the  central mesenteric, not quite to the pelvic brim. Normal prostate, bladder, colon and appendix. No mesenteric fat edema or abdominal wall hernia  Moderate lumbar facet DJD IMPRESSION:  Moderate acute pancreatitis with edema of the adjacent gastric wall    Past Medical History:    Diagnosis Date   ??? Diabetes ([place])    ??? High cholesterol    ??? Hypertension      Past Surgical History:   Procedure Laterality Date   ??? HX CHOLECYSTECTOMY        Family History   Problem Relation Age of Onset   ??? Hypertension Mother      Social History     Tobacco Use   ??? Smoking status: Never Smoker   ??? Smokeless tobacco: Never Used   Substance Use Topics   ??? Alcohol use: Yes     Alcohol/week: 74.0 standard drinks     Types: 74 Cans of beer per week      No Known Allergies  Current Facility-Administered Medications   Medication Dose Route Frequency   ??? [START ON 08/20/2020] potassium chloride SR (KLOR-CON 10) tablet 40 mEq  40 mEq Oral DAILY   ??? potassium chloride 10 mEq in 100 ml IVPB  10 mEq IntraVENous Q1H   ??? prochlorperazine (COMPAZINE) injection 5 mg  5 mg IntraVENous Q3H PRN   ??? [START ON 08/21/2020] HYDROmorphone (DILAUDID) injection 1 mg  1 mg IntraVENous Q4H PRN   ??? ketorolac (TORADOL) injection 30 mg  30 mg IntraVENous Q6H PRN   ??? vancomycin (VANCOCIN) 1,000 mg in 0.9% sodium chloride 250 mL (Vial2Bag)  1,000 mg IntraVENous Q12H   ??? [START ON 08/20/2020] Vancomycin trough level 6/25 at 12:00   Other ONCE   ??? VANCOMYCIN INFORMATION NOTE 1 Each  1 Each Other Rx Dosing/Monitoring   ??? famotidine (PF) (PEPCID) 20  mg in 0.9% sodium chloride 10 mL injection  20 mg IntraVENous Q12H   ??? hydrALAZINE (APRESOLINE) 20 mg/mL injection 10 mg  10 mg IntraVENous QID PRN   ??? sodium chloride (NS) flush 5-40 mL  5-40 mL IntraVENous Q8H   ??? sodium chloride (NS) flush 5-40 mL  5-40 mL IntraVENous PRN   ??? acetaminophen (TYLENOL) tablet 650 mg  650 mg Oral Q6H PRN    Or   ??? acetaminophen (TYLENOL) suppository 650 mg  650 mg Rectal Q6H PRN   ??? polyethylene glycol (MIRALAX) packet 17 g  17 g Oral DAILY PRN   ??? ondansetron (ZOFRAN ODT) tablet 4 mg  4 mg Oral Q8H PRN    Or   ??? ondansetron (ZOFRAN) injection 4 mg  4 mg IntraVENous Q6H PRN   ??? enoxaparin (LOVENOX) injection 40 mg  40 mg SubCUTAneous DAILY   ??? 0.9% sodium chloride  infusion  125 mL/hr IntraVENous CONTINUOUS   ??? piperacillin-tazobactam (ZOSYN) 3.375 g in 0.9% sodium chloride (MBP/ADV) 100 mL MBP  3.375 g IntraVENous Q8H   ??? lisinopriL (PRINIVIL, ZESTRIL) tablet 40 mg  40 mg Oral DAILY   ??? metoprolol tartrate (LOPRESSOR) tablet 25 mg  25 mg Oral BID   ??? LORazepam (ATIVAN) injection 1 mg  1 mg IntraVENous Q2H PRN   ??? chlordiazePOXIDE (LIBRIUM) capsule 10 mg  10 mg Oral TID   ??? glucose chewable tablet 16 g  4 Tablet Oral PRN   ??? dextrose 10% infusion 0-250 mL  0-250 mL IntraVENous PRN   ??? glucagon (GLUCAGEN) injection 1 mg  1 mg IntraMUSCular PRN   ??? insulin NPH (NOVOLIN N, HUMULIN N) injection 17 Units  0.2 Units/kg SubCUTAneous ACB&D   ??? insulin lispro (HUMALOG) injection   SubCUTAneous AC&HS        Review of Systems:  A detailed 10 organ review of systems is obtained with pertinent positives as listed in the History of Present Illness and Past Medical History. All others are negative.    Objective:     Physical Exam:  Visit Vitals  BP (!) 140/91 (BP 1 Location: Right upper arm, BP Patient Position: Sitting)   Pulse (!) 125   Temp 98.9 ??F (37.2 ??C)   Resp 18   Ht '5\' 9"'  (1.753 m)   Wt 87.1 kg (192 lb)   SpO2 98%   BMI 28.35 kg/m??        Skin:  Extremities and face reveal no rashes. No palmer erythema. No telangiectasias on the chest wall.  HEENT: Sclerae anicteric. Extra-occular muscles are intact. No oral ulcers.  No abnormal pigmentation of the lips. The neck is supple.  Cardiovascular: Regular rate and rhythm.   Respiratory:  Comfortable breathing with no accessory muscle use.   GI:  Abdomen nondistended, soft, and + tenderness.  Normal active bowel sounds. No enlargement of the liver or spleen. No masses palpable.  Rectal:  Deferred  Musculoskeletal: Extremities have good range of motion.  Neurological:  Gross memory appears intact.  Patient is alert and oriented.  Psychiatric:  Mood appears appropriate with judgement intact.  Lymphatic:  No cervical or supraclavicular  adenopathy.    Lab/Data Review:  CMP:   Lab Results   Component Value Date/Time    NA 135 (L) 08/19/2020 08:15 AM    K 2.9 (L) 08/19/2020 08:15 AM    CL 102 08/19/2020 08:15 AM    CO2 22 08/19/2020 08:15 AM    AGAP 11 08/19/2020 08:15 AM  GLU 195 (H) 08/19/2020 08:15 AM    BUN 8 08/19/2020 08:15 AM    CREA 1.03 08/19/2020 08:15 AM    GFRAA >60 08/19/2020 08:15 AM    GFRNA >60 08/19/2020 08:15 AM    CA 8.1 (L) 08/19/2020 08:15 AM    ALB 2.8 (L) 08/19/2020 08:15 AM    TP 7.2 08/19/2020 08:15 AM    GLOB 4.4 (H) 08/19/2020 08:15 AM    AGRAT 0.6 (L) 08/19/2020 08:15 AM    ALT 124 (H) 08/19/2020 08:15 AM     CBC:   Lab Results   Component Value Date/Time    WBC 13.0 (H) 08/19/2020 08:15 AM    HGB 13.3 08/19/2020 08:15 AM    HCT 39.7 08/19/2020 08:15 AM    PLT 170 08/19/2020 08:15 AM     Liver Panel:   Lab Results   Component Value Date/Time    ALB 2.8 (L) 08/19/2020 08:15 AM    TP 7.2 08/19/2020 08:15 AM    GLOB 4.4 (H) 08/19/2020 08:15 AM    AGRAT 0.6 (L) 08/19/2020 08:15 AM    ALT 124 (H) 08/19/2020 08:15 AM    AP 200 (H) 08/19/2020 08:15 AM     Pancreatic Markers:   Lab Results   Component Value Date/Time    LPSE 424 (H) 08/19/2020 08:15 AM         Assessment/Plan:     1. Pancreatitis     NPO     Continue IV hydration     Lipase in the am     CT abdomen 6/23: Moderate acute pancreatitis with edema of the adjacent gastric wall   2. Transaminitis       Hx of ETOH abuse       CT abdomen: Borderline fatty liver       Negative Acute Hepatitis Panel       Continue CIWA protocol  3. Frequent Hiccups      Baclofen 5 mg TID  4. Type 2 Diabetes     Continue Home medications     Continue bedside glucose monitoring    Thank you for allowing me to participate in this patients care.  Plan discussed with Dr. Karel Jarvis and he approves

## 2020-08-19 NOTE — Progress Notes (Signed)
 Reason for Admission:  Pancreatitis                     RUR Score:     OBS                Plan for utilizing home health:    No      PCP: First and Last name:  Other, Phys, MD Patient will need a new PCP established at discharge.     Name of Practice:    Are you a current patient: Yes/No:    Approximate date of last visit:    Can you participate in a virtual visit with your PCP:                     Current Advanced Directive/Advance Care Plan: Full Code      Healthcare Decision Maker:   Click here to complete HealthCare Decision Makers including selection of the Healthcare Decision Maker Relationship (ie Primary)           Antonio Melendez (son) [phone]                  Transition of Care Plan:                      Cm met with pt at the bedside to complete discharge assessment. Cm verified home address. Pt reports living at home with his friend Antonio Melendez. Pt ambulates without DME and is independent with completing personal care needs. Pt reports he has PCP, but his PCP is in [place], [place]. Pt indicated he is in the process of moving to [place] . Cm informed pt he can be set up with a new PCP when he is ready for discharge. Pt reports his friend - Antonio will provide him transportation home. Pt did not have an emergency contact. Cm asked pt if he would like to add an emergency contact. Pt gave Clinical research associate the following names to add as emergency contact:     Antonio Melendez (son) [phone]  Antonio Melendez (friend) [phone]    Cm asked pt if he had a medical POA. Pt reports his son - Antonio Melendez and grandson - Antonio Melendez is his medical POA.     Pt did not have any questions or concerns for Cm at this time. Cm will continue to follow.

## 2020-08-19 NOTE — Progress Notes (Signed)
OT eval order received and acknowledged. Pt screened and demonstrating baseline independence for self care tasks and functional mobility/transfers. Pt reports completing ADLs/using the bathroom IND during acute hospital stay and no need for skilled OT services at this time. OT evaluation order will therefore be discontinued as pt has no acute OT needs. Please reorder OT if pt functional status changes. Thank you.

## 2020-08-19 NOTE — Progress Notes (Signed)
 Vancomycin Dosing Consult  Day #1 of vancomycin therapy  Consult ordered by Kaitlin McCuskey PA for this 60 y.o. male for indication of intra-abdominal infection.  Antibiotic regimen: Vancomycin + Zosyn    Temp (24hrs), Avg:98.7 F (37.1 C), Min:98.2 F (36.8 C), Max:99.3 F (37.4 C)    Recent Labs     08/19/20  0815 08/18/20  1020   WBC 13.0* 12.5*   CREA 1.03 1.42*   BUN 8 12     Est CrCl: 84 mL/min  Concomitant nephrotoxic drugs: None    Cultures:   6/23 Urine: pending  6/23 Blood: pending    MRSA Swab: Not ordered, patient already received first dose of vancomycin    Target range: AUC/MIC 400-600    Recent level history:  Date/Time Dose & Interval Measured Level (mcg/mL) Associated AUC/MIC     08/19/20      1000mg  IV Q12H          Assessment/Plan:   Patient with acute pancreatitis, leukocytosis, Tmax 99.3 and elevated procal and CRP  Begin 1000mg  IV Q12H with trough level 6/25 at 12:00  Antimicrobial stop date TBD

## 2020-08-19 NOTE — Progress Notes (Signed)
Hospitalist Progress Note    Subjective:   Daily Progress Note: 08/19/2020 7:59 AM    Patient's pain is controlled for proximately 3 hours and then it becomes worse again. Patient is experiencing nausea and vomiting with hiccups.    Current Facility-Administered Medications   Medication Dose Route Frequency   ??? famotidine (PF) (PEPCID) 20 mg in 0.9% sodium chloride 10 mL injection  20 mg IntraVENous Q12H   ??? VANCOMYCIN INFORMATION NOTE 1 Each  1 Each Other Rx Dosing/Monitoring   ??? hydrALAZINE (APRESOLINE) 20 mg/mL injection 10 mg  10 mg IntraVENous QID PRN   ??? sodium chloride (NS) flush 5-40 mL  5-40 mL IntraVENous Q8H   ??? sodium chloride (NS) flush 5-40 mL  5-40 mL IntraVENous PRN   ??? acetaminophen (TYLENOL) tablet 650 mg  650 mg Oral Q6H PRN    Or   ??? acetaminophen (TYLENOL) suppository 650 mg  650 mg Rectal Q6H PRN   ??? polyethylene glycol (MIRALAX) packet 17 g  17 g Oral DAILY PRN   ??? ondansetron (ZOFRAN ODT) tablet 4 mg  4 mg Oral Q8H PRN    Or   ??? ondansetron (ZOFRAN) injection 4 mg  4 mg IntraVENous Q6H PRN   ??? enoxaparin (LOVENOX) injection 40 mg  40 mg SubCUTAneous DAILY   ??? Vancomycin random level 6/24 at 04:00   Other ONCE   ??? 0.9% sodium chloride infusion  125 mL/hr IntraVENous CONTINUOUS   ??? piperacillin-tazobactam (ZOSYN) 3.375 g in 0.9% sodium chloride (MBP/ADV) 100 mL MBP  3.375 g IntraVENous Q8H   ??? lisinopriL (PRINIVIL, ZESTRIL) tablet 40 mg  40 mg Oral DAILY   ??? metoprolol tartrate (LOPRESSOR) tablet 25 mg  25 mg Oral BID   ??? LORazepam (ATIVAN) injection 1 mg  1 mg IntraVENous Q2H PRN   ??? chlordiazePOXIDE (LIBRIUM) capsule 10 mg  10 mg Oral TID   ??? glucose chewable tablet 16 g  4 Tablet Oral PRN   ??? dextrose 10% infusion 0-250 mL  0-250 mL IntraVENous PRN   ??? glucagon (GLUCAGEN) injection 1 mg  1 mg IntraMUSCular PRN   ??? insulin NPH (NOVOLIN N, HUMULIN N) injection 17 Units  0.2 Units/kg SubCUTAneous ACB&D   ??? insulin lispro (HUMALOG) injection   SubCUTAneous AC&HS   ??? morphine injection 2 mg  2 mg  IntraVENous Q4H PRN   ??? prochlorperazine (COMPAZINE) injection 5 mg  5 mg IntraVENous Q4H PRN        Review of Systems  Review of Systems   Constitutional: Negative.    Respiratory: Negative.    Cardiovascular: Negative.    Gastrointestinal: Positive for abdominal pain, nausea and vomiting.        + hiccups   Neurological: Negative for dizziness, tremors and headaches.   All other systems reviewed and are negative.       Objective:     Visit Vitals  BP 133/88 (BP 1 Location: Left upper arm, BP Patient Position: At rest;Lying)   Pulse 80   Temp 99.3 ??F (37.4 ??C)   Resp 20   Ht '5\' 9"'  (1.753 m)   Wt 87.1 kg (192 lb)   SpO2 97%   BMI 28.35 kg/m??      O2 Device: None (Room air)    Temp (24hrs), Avg:98.5 ??F (36.9 ??C), Min:97.5 ??F (36.4 ??C), Max:99.3 ??F (37.4 ??C)      No intake/output data recorded.  06/22 1901 - 06/24 0700  In: -   Out: 900 [Urine:900]  Recent Results (from the past 24 hour(s))   EKG, 12 LEAD, INITIAL    Collection Time: 08/18/20 10:17 AM   Result Value Ref Range    Ventricular Rate 142 BPM    Atrial Rate 142 BPM    P-R Interval 140 ms    QRS Duration 70 ms    Q-T Interval 272 ms    QTC Calculation (Bezet) 418 ms    Calculated P Axis 66 degrees    Calculated R Axis 40 degrees    Calculated T Axis 145 degrees    Diagnosis       Sinus tachycardia  ST & T wave abnormality, consider inferolateral ischemia  Abnormal ECG  When compared with ECG of 03-Aug-2020 10:11,  Premature supraventricular complexes are no longer Present  Nonspecific T wave abnormality now evident in Inferior leads  Confirmed by PATHAK MD, SATISH ([id number]) on 08/18/2020 11:01:30 AM     CBC WITH AUTOMATED DIFF    Collection Time: 08/18/20 10:20 AM   Result Value Ref Range    WBC 12.5 (H) 4.1 - 11.1 K/uL    RBC 5.71 (H) 4.10 - 5.70 M/uL    HGB 16.0 12.1 - 17.0 g/dL    HCT 47.3 36.6 - 50.3 %    MCV 82.8 80.0 - 99.0 FL    MCH 28.0 26.0 - 34.0 PG    MCHC 33.8 30.0 - 36.5 g/dL    RDW 14.2 11.5 - 14.5 %    PLATELET 221 150 - 400 K/uL    MPV 12.0 8.9  - 12.9 FL    NEUTROPHILS 83 (H) 32 - 75 %    LYMPHOCYTES 9 (L) 12 - 49 %    MONOCYTES 7 5 - 13 %    EOSINOPHILS 0 0 - 7 %    BASOPHILS 0 0 - 1 %    IMMATURE GRANULOCYTES 1 (H) 0.0 - 0.5 %    ABS. NEUTROPHILS 10.5 (H) 1.8 - 8.0 K/UL    ABS. LYMPHOCYTES 1.1 0.8 - 3.5 K/UL    ABS. MONOCYTES 0.8 0.0 - 1.0 K/UL    ABS. EOSINOPHILS 0.0 0.0 - 0.4 K/UL    ABS. BASOPHILS 0.0 0.0 - 0.1 K/UL    ABS. IMM. GRANS. 0.1 (H) 0.00 - 0.04 K/UL    DF AUTOMATED     METABOLIC PANEL, COMPREHENSIVE    Collection Time: 08/18/20 10:20 AM   Result Value Ref Range    Sodium 126 (L) 136 - 145 mmol/L    Potassium 4.6 3.5 - 5.1 mmol/L    Chloride 89 (L) 97 - 108 mmol/L    CO2 23 21 - 32 mmol/L    Anion gap 14 5 - 15 mmol/L    Glucose 273 (H) 65 - 100 mg/dL    BUN 12 6 - 20 mg/dL    Creatinine 1.42 (H) 0.70 - 1.30 mg/dL    BUN/Creatinine ratio 8 (L) 12 - 20      GFR est AA >60 >60 ml/min/1.64m    GFR est non-AA 51 (L) >60 ml/min/1.757m   Calcium 8.8 8.5 - 10.1 mg/dL    Bilirubin, total 1.9 (H) 0.2 - 1.0 mg/dL    AST (SGOT) 347 (H) 15 - 37 U/L    ALT (SGPT) 241 (H) 12 - 78 U/L    Alk. phosphatase 300 (H) 45 - 117 U/L    Protein, total 8.9 (H) 6.4 - 8.2 g/dL    Albumin 3.7 3.5 - 5.0 g/dL  Globulin 5.2 (H) 2.0 - 4.0 g/dL    A-G Ratio 0.7 (L) 1.1 - 2.2     LIPASE    Collection Time: 08/18/20 10:20 AM   Result Value Ref Range    Lipase 1,093 (H) 73 - 393 U/L   NT-PRO BNP    Collection Time: 08/18/20 10:20 AM   Result Value Ref Range    NT pro-BNP 541 (H) <125 pg/mL   ETHYL ALCOHOL    Collection Time: 08/18/20 10:20 AM   Result Value Ref Range    ALCOHOL(ETHYL),SERUM <10 <10 mg/dL   TROPONIN-HIGH SENSITIVITY    Collection Time: 08/18/20 10:20 AM   Result Value Ref Range    Troponin-High Sensitivity 17 0 - 76 ng/L   GLUCOSE, POC    Collection Time: 08/18/20 10:34 AM   Result Value Ref Range    Glucose (POC) 241 (H) 65 - 117 mg/dL    Performed by [facility]    LACTIC ACID    Collection Time: 08/18/20 11:15 AM   Result Value Ref Range    Lactic acid  4.6 (HH) 0.4 - 2.0 mmol/L   URINALYSIS W/ RFLX MICROSCOPIC    Collection Time: 08/18/20 12:09 PM   Result Value Ref Range    Color Yellow      Appearance Clear Clear      Specific gravity 1.010 1.003 - 1.030      pH (UA) 6.0 5.0 - 8.0      Protein 30 (A) Negative mg/dL    Glucose >1,000 (A) Negative mg/dL    Ketone 15 (A) Negative mg/dL    Bilirubin Negative Negative      Blood Large (A) Negative      Urobilinogen 0.1 (L) 0.2 - 1.0 EU/dL    Nitrites Negative Negative      Leukocyte Esterase Small (A) Negative     DRUG SCREEN, URINE    Collection Time: 08/18/20 12:09 PM   Result Value Ref Range    AMPHETAMINES Negative Negative      BARBITURATES Negative Negative      Buprenorphine screen, urine Negative Negative      BENZODIAZEPINES Positive (A) Negative      COCAINE Negative Negative      METHADONE Negative Negative      Methamphetamines Negative Negative      OPIATES Negative Negative      OXYCODONE SCREEN Negative Negative      PCP(PHENCYCLIDINE) Negative Negative      PROPOXYPHENE Negative Negative      THC (TH-CANNABINOL) Negative Negative      TRICYCLICS Negative Negative     URINE MICROSCOPIC    Collection Time: 08/18/20 12:09 PM   Result Value Ref Range    WBC 20-50 0 - 4 /hpf    RBC 0-5 0 - 5 /hpf    Epithelial cells Few Few /lpf    Bacteria 4+ (A) Negative /hpf   LACTIC ACID    Collection Time: 08/18/20 12:55 PM   Result Value Ref Range    Lactic acid 2.8 (HH) 0.4 - 2.0 mmol/L   GLUCOSE, POC    Collection Time: 08/18/20  8:33 PM   Result Value Ref Range    Glucose (POC) 229 (H) 65 - 117 mg/dL    Performed by Riley Lam    LIPASE    Collection Time: 08/18/20  9:44 PM   Result Value Ref Range    Lipase 697 (H) 73 - 393 U/L   LACTIC ACID  Collection Time: 08/18/20  9:44 PM   Result Value Ref Range    Lactic acid 2.6 (HH) 0.4 - 2.0 mmol/L   PROCALCITONIN    Collection Time: 08/18/20  9:44 PM   Result Value Ref Range    Procalcitonin 1.48 (H) 0 ng/mL        [place] ABD LTD   Final Result      CT ABD PELV W CONT    Final Result   Moderate acute pancreatitis with edema of the adjacent gastric wall           PHYSICAL EXAM:    Physical Exam  Vitals and nursing note reviewed.   Constitutional:       Appearance: He is obese.   HENT:      Head: Normocephalic and atraumatic.   Eyes:      General: Scleral icterus present.   Cardiovascular:      Rate and Rhythm: Normal rate and regular rhythm.   Pulmonary:      Effort: No respiratory distress.      Breath sounds: No wheezing.   Abdominal:      General: Bowel sounds are normal. There is no distension.      Palpations: Abdomen is soft.      Tenderness: There is abdominal tenderness.      Comments: Negative for cullen or turners sign    Genitourinary:     Comments: No foley present  Musculoskeletal:      Right lower leg: No edema.      Left lower leg: No edema.   Skin:     General: Skin is warm.   Neurological:      Mental Status: He is alert and oriented to person, place, and time.   Psychiatric:         Mood and Affect: Mood normal.          Data Review    Recent Results (from the past 24 hour(s))   EKG, 12 LEAD, INITIAL    Collection Time: 08/18/20 10:17 AM   Result Value Ref Range    Ventricular Rate 142 BPM    Atrial Rate 142 BPM    P-R Interval 140 ms    QRS Duration 70 ms    Q-T Interval 272 ms    QTC Calculation (Bezet) 418 ms    Calculated P Axis 66 degrees    Calculated R Axis 40 degrees    Calculated T Axis 145 degrees    Diagnosis       Sinus tachycardia  ST & T wave abnormality, consider inferolateral ischemia  Abnormal ECG  When compared with ECG of 03-Aug-2020 10:11,  Premature supraventricular complexes are no longer Present  Nonspecific T wave abnormality now evident in Inferior leads  Confirmed by Merrill MD, SATISH ([id number]) on 08/18/2020 11:01:30 AM     CBC WITH AUTOMATED DIFF    Collection Time: 08/18/20 10:20 AM   Result Value Ref Range    WBC 12.5 (H) 4.1 - 11.1 K/uL    RBC 5.71 (H) 4.10 - 5.70 M/uL    HGB 16.0 12.1 - 17.0 g/dL    HCT 47.3 36.6 - 50.3 %    MCV 82.8  80.0 - 99.0 FL    MCH 28.0 26.0 - 34.0 PG    MCHC 33.8 30.0 - 36.5 g/dL    RDW 14.2 11.5 - 14.5 %    PLATELET 221 150 - 400 K/uL    MPV 12.0 8.9 - 12.9 FL  NEUTROPHILS 83 (H) 32 - 75 %    LYMPHOCYTES 9 (L) 12 - 49 %    MONOCYTES 7 5 - 13 %    EOSINOPHILS 0 0 - 7 %    BASOPHILS 0 0 - 1 %    IMMATURE GRANULOCYTES 1 (H) 0.0 - 0.5 %    ABS. NEUTROPHILS 10.5 (H) 1.8 - 8.0 K/UL    ABS. LYMPHOCYTES 1.1 0.8 - 3.5 K/UL    ABS. MONOCYTES 0.8 0.0 - 1.0 K/UL    ABS. EOSINOPHILS 0.0 0.0 - 0.4 K/UL    ABS. BASOPHILS 0.0 0.0 - 0.1 K/UL    ABS. IMM. GRANS. 0.1 (H) 0.00 - 0.04 K/UL    DF AUTOMATED     METABOLIC PANEL, COMPREHENSIVE    Collection Time: 08/18/20 10:20 AM   Result Value Ref Range    Sodium 126 (L) 136 - 145 mmol/L    Potassium 4.6 3.5 - 5.1 mmol/L    Chloride 89 (L) 97 - 108 mmol/L    CO2 23 21 - 32 mmol/L    Anion gap 14 5 - 15 mmol/L    Glucose 273 (H) 65 - 100 mg/dL    BUN 12 6 - 20 mg/dL    Creatinine 1.42 (H) 0.70 - 1.30 mg/dL    BUN/Creatinine ratio 8 (L) 12 - 20      GFR est AA >60 >60 ml/min/1.1m    GFR est non-AA 51 (L) >60 ml/min/1.749m   Calcium 8.8 8.5 - 10.1 mg/dL    Bilirubin, total 1.9 (H) 0.2 - 1.0 mg/dL    AST (SGOT) 347 (H) 15 - 37 U/L    ALT (SGPT) 241 (H) 12 - 78 U/L    Alk. phosphatase 300 (H) 45 - 117 U/L    Protein, total 8.9 (H) 6.4 - 8.2 g/dL    Albumin 3.7 3.5 - 5.0 g/dL    Globulin 5.2 (H) 2.0 - 4.0 g/dL    A-G Ratio 0.7 (L) 1.1 - 2.2     LIPASE    Collection Time: 08/18/20 10:20 AM   Result Value Ref Range    Lipase 1,093 (H) 73 - 393 U/L   NT-PRO BNP    Collection Time: 08/18/20 10:20 AM   Result Value Ref Range    NT pro-BNP 541 (H) <125 pg/mL   ETHYL ALCOHOL    Collection Time: 08/18/20 10:20 AM   Result Value Ref Range    ALCOHOL(ETHYL),SERUM <10 <10 mg/dL   TROPONIN-HIGH SENSITIVITY    Collection Time: 08/18/20 10:20 AM   Result Value Ref Range    Troponin-High Sensitivity 17 0 - 76 ng/L   GLUCOSE, POC    Collection Time: 08/18/20 10:34 AM   Result Value Ref Range    Glucose (POC)  241 (H) 65 - 117 mg/dL    Performed by [facility]  LACTIC ACID    Collection Time: 08/18/20 11:15 AM   Result Value Ref Range    Lactic acid 4.6 (HH) 0.4 - 2.0 mmol/L   URINALYSIS W/ RFLX MICROSCOPIC    Collection Time: 08/18/20 12:09 PM   Result Value Ref Range    Color Yellow      Appearance Clear Clear      Specific gravity 1.010 1.003 - 1.030      pH (UA) 6.0 5.0 - 8.0      Protein 30 (A) Negative mg/dL    Glucose >1,000 (A) Negative mg/dL    Ketone 15 (A)  Negative mg/dL    Bilirubin Negative Negative      Blood Large (A) Negative      Urobilinogen 0.1 (L) 0.2 - 1.0 EU/dL    Nitrites Negative Negative      Leukocyte Esterase Small (A) Negative     DRUG SCREEN, URINE    Collection Time: 08/18/20 12:09 PM   Result Value Ref Range    AMPHETAMINES Negative Negative      BARBITURATES Negative Negative      Buprenorphine screen, urine Negative Negative      BENZODIAZEPINES Positive (A) Negative      COCAINE Negative Negative      METHADONE Negative Negative      Methamphetamines Negative Negative      OPIATES Negative Negative      OXYCODONE SCREEN Negative Negative      PCP(PHENCYCLIDINE) Negative Negative      PROPOXYPHENE Negative Negative      THC (TH-CANNABINOL) Negative Negative      TRICYCLICS Negative Negative     URINE MICROSCOPIC    Collection Time: 08/18/20 12:09 PM   Result Value Ref Range    WBC 20-50 0 - 4 /hpf    RBC 0-5 0 - 5 /hpf    Epithelial cells Few Few /lpf    Bacteria 4+ (A) Negative /hpf   LACTIC ACID    Collection Time: 08/18/20 12:55 PM   Result Value Ref Range    Lactic acid 2.8 (HH) 0.4 - 2.0 mmol/L   GLUCOSE, POC    Collection Time: 08/18/20  8:33 PM   Result Value Ref Range    Glucose (POC) 229 (H) 65 - 117 mg/dL    Performed by [place]    Collection Time: 08/18/20  9:44 PM   Result Value Ref Range    Lipase 697 (H) 73 - 393 U/L   LACTIC ACID    Collection Time: 08/18/20  9:44 PM   Result Value Ref Range    Lactic acid 2.6 (HH) 0.4 - 2.0 mmol/L   PROCALCITONIN     Collection Time: 08/18/20  9:44 PM   Result Value Ref Range    Procalcitonin 1.48 (H) 0 ng/mL        Assessment/Plan:     Active Problems:    Pancreatitis (08/18/2020)        Hospital Course:    Antonio JUENGER Sr. is a 60 y.o. male with a PMH of diabetes, hypertension, alcohol abuse, and hyperlipidemia who presented to the freestanding ED with abdominal pain. In FSED tachycardic and hypertensive. Initial labs significant for WBC of 12.5, sodium of 126, glucose of 273, bilirubin of 1.9, ALT of 241, AST of 347, alk phosphatase of 300, lipase of 1093, lactate of 4.6, and BNP of 541. Urinalysis with proteinuria, glucosuria, ketonuria, hematuria, pyuria and leuk esterase with bacteria. CT of the abdomen showed moderate acute pancreatitis with edema of the adjacent gastric wall. Patient admitted for further work-up. Patient started on IV Pepcid, fluids, Zosyn, and pain control. GI and general surgery consulted. General surgery deemed not a good surgical candidate at this time. Patient WBC and procalcitonin elevated. ID consulted. Will add vancomycin for empiric coverage.    Pancreatitis  Lipase down-trending, will trend  Continue on IVF and pain control  Advance diet per GI: N.P.O.  GI and general surgery following  ??  Sepsis, POA with tachycardia, WBC elevated and lactic  Elevated inflammatory markers, trend  Continue on zosyn and IVF  Will add vancomycin  6/23 blood: pending  ID consulted  ??  UTI  6/23 urine: pending  Continue on Zosyn   ??  Elevated transamidase  Hepatitis panel non-reactive  ??  Diabetes Mellitus, type II  A1c 11.6  Hold 17 units of NPH while n.p.o.   SSI as needed  ??  Hypertension  Continue on metoprolol and lisinopril  ??  Hyperlipidemia  Hold pravastatin  ??  AKI on CKD - resolved  Creatinine 1.42 > 1.03   Monitor  ??  Alcohol use  Continue on librium  CIWA protocol in place  ??  DVT Prophylaxis: lovenox  GI Prophylaxis: pepcid  Discharge and disposition barriers: clinical improvement, GI/ID consult 24 -  48 hours    Care Plan discussed with: Patient/Family, Nurse and Case Manager    Total time spent with patient: 35 minutes.

## 2020-08-19 NOTE — Progress Notes (Signed)
Patient was approached for PT eval.Patient sitting at eob, SO in room with him who he lives with patient reported being indep in all activities.opatient able to perform bed mob, transfers and gait indep.on and off the commode indep.No LOB NO AD.No skilled therapy required.We will DC PT.

## 2020-08-19 NOTE — Progress Notes (Signed)
Bedside shift report was given to Kayla, RN.

## 2020-08-20 LAB — GLUCOSE, POC
Glucose (POC): 88 mg/dL (ref 65–117)
Glucose (POC): 56 mg/dL — ABNORMAL LOW (ref 65–117)
Glucose (POC): 66 mg/dL (ref 65–117)
Glucose (POC): 81 mg/dL (ref 65–117)
Glucose (POC): 87 mg/dL (ref 65–117)
Glucose (POC): 89 mg/dL (ref 65–117)
Glucose (POC): 127 mg/dL — ABNORMAL HIGH (ref 65–117)

## 2020-08-20 LAB — METABOLIC PANEL, COMPREHENSIVE
A-G Ratio: 0.6 — ABNORMAL LOW (ref 1.1–2.2)
ALT (SGPT): 87 U/L — ABNORMAL HIGH (ref 12–78)
AST (SGOT): 63 U/L — ABNORMAL HIGH (ref 15–37)
Albumin: 2.6 g/dL — ABNORMAL LOW (ref 3.5–5.0)
Alk. phosphatase: 191 U/L — ABNORMAL HIGH (ref 45–117)
Anion gap: 11 mmol/L (ref 5–15)
BUN/Creatinine ratio: 12 (ref 12–20)
BUN: 14 mg/dL (ref 6–20)
Bilirubin, total: 1.1 mg/dL — ABNORMAL HIGH (ref 0.2–1.0)
CO2: 20 mmol/L — ABNORMAL LOW (ref 21–32)
Calcium: 8 mg/dL — ABNORMAL LOW (ref 8.5–10.1)
Chloride: 108 mmol/L (ref 97–108)
Creatinine: 1.2 mg/dL (ref 0.70–1.30)
GFR est AA: >60 mL/min/{1.73_m2} (ref >60)
GFR est non-AA: >60 mL/min/{1.73_m2} (ref >60)
Globulin: 4.6 g/dL — ABNORMAL HIGH (ref 2.0–4.0)
Glucose: 87 mg/dL (ref 65–100)
Potassium: 3.1 mmol/L — ABNORMAL LOW (ref 3.5–5.1)
Protein, total: 7.2 g/dL (ref 6.4–8.2)
Sodium: 139 mmol/L (ref 136–145)

## 2020-08-20 LAB — CBC WITH AUTOMATED DIFF
ABS. BASOPHILS: 0 10*3/uL (ref 0.0–0.1)
ABS. EOSINOPHILS: 0.1 10*3/uL (ref 0.0–0.4)
ABS. IMM. GRANS.: 0 10*3/uL (ref 0.00–0.04)
ABS. LYMPHOCYTES: 1.1 10*3/uL (ref 0.8–3.5)
ABS. MONOCYTES: 0.8 10*3/uL (ref 0.0–1.0)
ABS. NEUTROPHILS: 6.4 10*3/uL (ref 1.8–8.0)
ABSOLUTE NRBC: 0 10*3/uL (ref 0.00–0.01)
BASOPHILS: 0 % (ref 0–1)
EOSINOPHILS: 1 % (ref 0–7)
HCT: 39.3 % (ref 36.6–50.3)
HGB: 12.5 g/dL (ref 12.1–17.0)
IMMATURE GRANULOCYTES: 0 % (ref 0–0.5)
LYMPHOCYTES: 13 % (ref 12–49)
MCH: 27.8 PG (ref 26.0–34.0)
MCHC: 31.8 g/dL (ref 30.0–36.5)
MCV: 87.5 FL (ref 80.0–99.0)
MONOCYTES: 10 % (ref 5–13)
MPV: 11 FL (ref 8.9–12.9)
NEUTROPHILS: 76 % — ABNORMAL HIGH (ref 32–75)
NRBC: 0 PER 100 WBC
PLATELET: 167 10*3/uL (ref 150–400)
RBC: 4.49 M/uL (ref 4.10–5.70)
RDW: 15.7 % — ABNORMAL HIGH (ref 11.5–14.5)
WBC: 8.4 10*3/uL (ref 4.1–11.1)

## 2020-08-20 LAB — VANCOMYCIN, TROUGH: Vancomycin,trough: 6.5 ug/mL (ref 5.0–10.0)

## 2020-08-20 LAB — CULTURE, URINE
Colonies Counted: >100000
Colony Count: >100000

## 2020-08-20 LAB — LIPASE
Lipase: 1045 U/L — ABNORMAL HIGH (ref 73–393)
Lipase: 1045 U/L — ABNORMAL HIGH (ref 73–393)

## 2020-08-20 LAB — C REACTIVE PROTEIN, QT: C-Reactive protein: 24.6 mg/dL — ABNORMAL HIGH (ref 0.00–0.60)

## 2020-08-20 LAB — POCT GLUCOSE
POC Glucose: 88 mg/dL (ref 65–117)
POC Glucose: 56 mg/dL — ABNORMAL LOW (ref 65–117)
POC Glucose: 66 mg/dL (ref 65–117)
POC Glucose: 81 mg/dL (ref 65–117)
POC Glucose: 87 mg/dL (ref 65–117)
POC Glucose: 89 mg/dL (ref 65–117)
POC Glucose: 127 mg/dL — ABNORMAL HIGH (ref 65–117)

## 2020-08-20 LAB — CBC WITH AUTO DIFFERENTIAL
Basophils %: 0 % (ref 0–1)
Basophils Absolute: 0 10*3/uL (ref 0.0–0.1)
Eosinophils %: 1 % (ref 0–7)
Eosinophils Absolute: 0.1 10*3/uL (ref 0.0–0.4)
Granulocyte Absolute Count: 0 10*3/uL (ref 0.00–0.04)
Hematocrit: 39.3 % (ref 36.6–50.3)
Hemoglobin: 12.5 g/dL (ref 12.1–17.0)
Immature Granulocytes: 0 % (ref 0–0.5)
Lymphocytes %: 13 % (ref 12–49)
Lymphocytes Absolute: 1.1 10*3/uL (ref 0.8–3.5)
MCH: 27.8 PG (ref 26.0–34.0)
MCHC: 31.8 g/dL (ref 30.0–36.5)
MCV: 87.5 FL (ref 80.0–99.0)
MPV: 11 FL (ref 8.9–12.9)
Monocytes %: 10 % (ref 5–13)
Monocytes Absolute: 0.8 10*3/uL (ref 0.0–1.0)
NRBC Absolute: 0 10*3/uL (ref 0.00–0.01)
Neutrophils %: 76 % — ABNORMAL HIGH (ref 32–75)
Neutrophils Absolute: 6.4 10*3/uL (ref 1.8–8.0)
Nucleated RBCs: 0 PER 100 WBC
Platelets: 167 10*3/uL (ref 150–400)
RBC: 4.49 M/uL (ref 4.10–5.70)
RDW: 15.7 % — ABNORMAL HIGH (ref 11.5–14.5)
WBC: 8.4 10*3/uL (ref 4.1–11.1)

## 2020-08-20 LAB — COMPREHENSIVE METABOLIC PANEL
ALT: 87 U/L — ABNORMAL HIGH (ref 12–78)
AST: 63 U/L — ABNORMAL HIGH (ref 15–37)
Albumin/Globulin Ratio: 0.6 — ABNORMAL LOW (ref 1.1–2.2)
Albumin: 2.6 g/dL — ABNORMAL LOW (ref 3.5–5.0)
Alkaline Phosphatase: 191 U/L — ABNORMAL HIGH (ref 45–117)
Anion Gap: 11 mmol/L (ref 5–15)
BUN: 14 mg/dL (ref 6–20)
Bun/Cre Ratio: 12 (ref 12–20)
CO2: 20 mmol/L — ABNORMAL LOW (ref 21–32)
Calcium: 8 mg/dL — ABNORMAL LOW (ref 8.5–10.1)
Chloride: 108 mmol/L (ref 97–108)
Creatinine: 1.2 mg/dL (ref 0.70–1.30)
EGFR IF NonAfrican American: >60 mL/min/{1.73_m2} (ref >60)
GFR African American: >60 mL/min/{1.73_m2} (ref >60)
Globulin: 4.6 g/dL — ABNORMAL HIGH (ref 2.0–4.0)
Glucose: 87 mg/dL (ref 65–100)
Potassium: 3.1 mmol/L — ABNORMAL LOW (ref 3.5–5.1)
Sodium: 139 mmol/L (ref 136–145)
Total Bilirubin: 1.1 mg/dL — ABNORMAL HIGH (ref 0.2–1.0)
Total Protein: 7.2 g/dL (ref 6.4–8.2)

## 2020-08-20 LAB — VANCOMYCIN TROUGH: Vancomycin Tr: 6.5 ug/mL (ref 5.0–10.0)

## 2020-08-20 LAB — C-REACTIVE PROTEIN: CRP: 24.6 mg/dL — ABNORMAL HIGH (ref 0.00–0.60)

## 2020-08-20 MED ORDER — SODIUM CHLORIDE 0.9 % IV
750 mg | Freq: Two times a day (BID) | INTRAVENOUS | Status: DC
Start: 2020-08-20 — End: 2020-08-21
  Administered 2020-08-20 – 2020-08-21 (×2): via INTRAVENOUS

## 2020-08-20 MED ORDER — CHLORDIAZEPOXIDE 5 MG CAP
5 mg | Freq: Three times a day (TID) | ORAL | Status: DC
Start: 2020-08-20 — End: 2020-08-26
  Administered 2020-08-20 – 2020-08-26 (×17): via ORAL

## 2020-08-20 MED FILL — ENOXAPARIN 40 MG/0.4 ML SUB-Q SYRINGE: 40 mg/0.4 mL | SUBCUTANEOUS | Qty: 0.4

## 2020-08-20 MED FILL — PIPERACILLIN-TAZOBACTAM 3.375 GRAM IV SOLR: 3.375 gram | INTRAVENOUS | Qty: 3.38

## 2020-08-20 MED FILL — METOPROLOL TARTRATE 25 MG TAB: 25 mg | ORAL | Qty: 1

## 2020-08-20 MED FILL — POTASSIUM CHLORIDE SR 10 MEQ TAB: 10 mEq | ORAL | Qty: 4

## 2020-08-20 MED FILL — VANCOMYCIN 750 MG IV SOLUTION: 750 mg | INTRAVENOUS | Qty: 750

## 2020-08-20 MED FILL — HYDROMORPHONE (PF) 1 MG/ML IJ SOLN: 1 mg/mL | INTRAMUSCULAR | Qty: 1

## 2020-08-20 MED FILL — BACLOFEN 10 MG TAB: 10 mg | ORAL | Qty: 1

## 2020-08-20 MED FILL — HYDRALAZINE 20 MG/ML IJ SOLN: 20 mg/mL | INTRAMUSCULAR | Qty: 1

## 2020-08-20 MED FILL — CHLORDIAZEPOXIDE 10 MG CAP: 10 mg | ORAL | Qty: 1

## 2020-08-20 MED FILL — LISINOPRIL 40 MG TAB: 40 mg | ORAL | Qty: 1

## 2020-08-20 MED FILL — FAMOTIDINE (PF) 20 MG/2 ML IV: 20 mg/2 mL | INTRAVENOUS | Qty: 2

## 2020-08-20 MED FILL — HUMULIN N NPH U-100 INSULIN (ISOPHANE SUSP) 100 UNIT/ML SUBCUTANEOUS: 100 unit/mL | SUBCUTANEOUS | Qty: 17

## 2020-08-20 MED FILL — CHLORDIAZEPOXIDE 5 MG CAP: 5 mg | ORAL | Qty: 1

## 2020-08-20 MED FILL — VANCOMYCIN 1,000 MG IV SOLR: 1000 mg | INTRAVENOUS | Qty: 1000

## 2020-08-20 MED FILL — KETOROLAC TROMETHAMINE 30 MG/ML INJECTION: 30 mg/mL (1 mL) | INTRAMUSCULAR | Qty: 1

## 2020-08-20 NOTE — Progress Notes (Signed)
 Vancomycin Dosing Consult  Day # 2 of vancomycin therapy  Consult ordered by Kaitlin McCuskey, PA for this 60 y.o. male for indication of intraabdominal infection.    Antibiotic regimen: Vancomycin + Zosyn    Temp (24hrs), Avg:97.6 F (36.4 C), Min:97.4 F (36.3 C), Max:97.8 F (36.6 C)    Recent Labs     08/20/20  1647 08/19/20  0815 08/18/20  1020   WBC 8.4 13.0* 12.5*     Recent Labs     08/20/20  0910 08/19/20  0815 08/18/20  1020   CREA 1.20 1.03 1.42*   BUN 14 8 12      Recent Labs     08/20/20  0910 08/19/20  0815   CRP 24.60* 29.80*     Recent Labs     08/19/20  0815 08/18/20  2144   PCT 1.61* 1.48*       Estimated Creatinine Clearance: 72.5 mL/min (based on SCr of 1.2 mg/dL). ml/min  Concomitant nephrotoxic drugs: None    Cultures:   6/23 Urine: E. Coli  6/23 Blood: NGTD, prelim    MRSA Swab: Not ordered, patient already received first dose of vancomycin    Target range: AUC/MIC 400-600    Recent level history (3):  Date/Time Previous Dose & Interval Measured Level (mcg/mL) Associated AUC/MIC   6/24 1000mg  IV q12h 6.5 525     Ht Readings from Last 1 Encounters:   08/18/20 175.3 cm (69)     Wt Readings from Last 1 Encounters:   08/18/20 87.1 kg (192 lb)     Ideal body weight: 70.7 kg (155 lb 13.8 oz)  Adjusted ideal body weight: 77.3 kg (170 lb 5.1 oz)        Assessment/Plan:   WBC, Procal, CRP are trending down  Vancomycin AUC is therapeutic; however, Scr is trending up  Reduce Vancomycin 750mg  IV q12h  Antimicrobial stop date TBD

## 2020-08-20 NOTE — Progress Notes (Signed)
Progress Note    Patient: Antonio DEZIEL Sr. MRN: [id number]  SSN: [id number]    Date of Birth: Mar 01, 1960  Age: 60 y.o.  Sex: male      Admit Date: 08/18/2020    LOS: 1 day     Subjective:   GI in consultation for Pancreatitis.    6/25: Patient seen resting. He reports his abdominal pain is improving. He is on IV hydration. His lipase increased to 1.045 this morning. He is wanting to eat.  He denies nausea and vomiting. Reports his hiccups have improved from yesterday.    History of Present Illness: Antonio Magner. is a 60 y.o. male who is seen in consultation for Pancreatitis. Antonio Melendez presented to the ED with complaints of abdominal pain, nausea, and vomiting. He reports his abdominal pain is on and off. He reports he has been drinking about a 12 pack of beer or bottles of alcohol nearly every day for the past 3 weeks. He denies fever, hematemesis, or hemoptysis. He denies tobacco use.His last alcoholic drink was on Tuesday. He does have abdominal tenderness with light palpation. He reports frequent hiccups. He has a past medical history significant for hypertension, type 2 diabetes, insulin dependent, and hyperlipidemia. His lipase on admission was over 1,000 currently it is 424. He is NPO with IV hydration. Potassium low at 2.9 this morning, he was given IV potassium. Total bilirubin 1.6, ALT 124, AST 111, Alk Phosphatase 200. CT abdomen shows moderate acute pancreatitis with edema of the adjacent gastric wall and borderline fatty liver. His acute hepatitis panel is negative.   ??  CT abdomen 08/18/20: Contrast study shows clear lung bases  ??Borderline fatty liver. Spleen, pancreas are normal. Moderate peripancreatic fat edema and free fluid throughout the region. It mostly surrounds the head and  neck. Mild edema of the adjacent lower gastric wall. Gallbladder is out. No  biliary dilatation. Normal adrenals and kidneys. No small bowel or vascular  abnormality, lymphadenopathy or fluid collection.  Free fluid extends down the  central mesenteric, not quite to the pelvic brim. Normal prostate, bladder, colon and appendix. No mesenteric fat edema or abdominal wall hernia  Moderate lumbar facet DJD IMPRESSION:  Moderate acute pancreatitis with edema of the adjacent gastric wall  ??      Objective:     Vitals:    08/19/20 2000 08/19/20 2032 08/20/20 0811 08/20/20 1205   BP:  112/73 (!) 170/101 (!) 153/99   Pulse: (!) 107 (!) 103 87 86   Resp:  18 18    Temp:  97.8 ??F (36.6 ??C) 97.6 ??F (36.4 ??C)    SpO2:  97% 100%    Weight:       Height:            Intake and Output:  Current Shift: 06/25 0701 - 06/25 1900  In: 225 [I.V.:225]  Out: -   Last three shifts: 06/23 1901 - 06/25 0700  In: 2270 [P.O.:120; I.V.:2150]  Out: -     Physical Exam:   Skin:  Extremities and face reveal no rashes. No palmer erythema.   HEENT: Sclerae anicteric. Extra-occular muscles are intact. No abnormal pigmentation of the lips. The neck is supple.  Cardiovascular: Regular rate and rhythm.  Respiratory:  Comfortable breathing with no accessory muscle use.   GI:  Abdomen + distended, soft, and + tender. No enlargement of the liver or spleen. No masses palpable.  Rectal:  Deferred  Musculoskeletal: Extremities have good range  of motion.  Neurological:  Gross memory appears intact.  Patient is alert and oriented.  Psychiatric:  Mood appears appropriate with judgement intact.  Lymphatic:  No visible adenopathy      Lab/Data Review:  Recent Results (from the past 24 hour(s))   GLUCOSE, POC    Collection Time: 08/19/20  4:46 PM   Result Value Ref Range    Glucose (POC) 149 (H) 65 - 117 mg/dL    Performed by Everette Rank    GLUCOSE, POC    Collection Time: 08/19/20 10:22 PM   Result Value Ref Range    Glucose (POC) 88 65 - 117 mg/dL    Performed by Lemmie Evens    GLUCOSE, POC    Collection Time: 08/20/20  7:12 AM   Result Value Ref Range    Glucose (POC) 56 (L) 65 - 117 mg/dL    Performed by Everette Rank    GLUCOSE, POC    Collection Time: 08/20/20   7:14 AM   Result Value Ref Range    Glucose (POC) 66 65 - 117 mg/dL    Performed by Everette Rank    GLUCOSE, POC    Collection Time: 08/20/20  8:14 AM   Result Value Ref Range    Glucose (POC) 81 65 - 117 mg/dL    Performed by Rometta Emery    METABOLIC PANEL, COMPREHENSIVE    Collection Time: 08/20/20  9:10 AM   Result Value Ref Range    Sodium 139 136 - 145 mmol/L    Potassium 3.1 (L) 3.5 - 5.1 mmol/L    Chloride 108 97 - 108 mmol/L    CO2 20 (L) 21 - 32 mmol/L    Anion gap 11 5 - 15 mmol/L    Glucose 87 65 - 100 mg/dL    BUN 14 6 - 20 mg/dL    Creatinine 1.20 0.70 - 1.30 mg/dL    BUN/Creatinine ratio 12 12 - 20      GFR est AA >60 >60 ml/min/1.2m    GFR est non-AA >60 >60 ml/min/1.756m   Calcium 8.0 (L) 8.5 - 10.1 mg/dL    Bilirubin, total 1.1 (H) 0.2 - 1.0 mg/dL    AST (SGOT) 63 (H) 15 - 37 U/L    ALT (SGPT) 87 (H) 12 - 78 U/L    Alk. phosphatase 191 (H) 45 - 117 U/L    Protein, total 7.2 6.4 - 8.2 g/dL    Albumin 2.6 (L) 3.5 - 5.0 g/dL    Globulin 4.6 (H) 2.0 - 4.0 g/dL    A-G Ratio 0.6 (L) 1.1 - 2.2     LIPASE    Collection Time: 08/20/20  9:10 AM   Result Value Ref Range    Lipase 1,045 (H) 73 - 393 U/L   C REACTIVE PROTEIN, QT    Collection Time: 08/20/20  9:10 AM   Result Value Ref Range    C-Reactive protein 24.60 (H) 0.00 - 0.60 mg/dL   GLUCOSE, POC    Collection Time: 08/20/20 11:39 AM   Result Value Ref Range    Glucose (POC) 89 65 - 117 mg/dL    Performed by [facility]    Collection Time: 08/20/20 12:07 PM   Result Value Ref Range    Glucose (POC) 87 65 - 117 mg/dL    Performed by ZoEverette Rank             [place]  Final Result      CT ABD PELV W CONT   Final Result   Moderate acute pancreatitis with edema of the adjacent gastric wall          Assessment:     Active Problems:    Pancreatitis (08/18/2020)      Acute pancreatitis (08/19/2020)      Acute cystitis with hematuria (08/20/2020)        Plan:   1. Pancreatitis     clear Liquid diet     Continue IV hydration     Lipase  in the am     CT abdomen 6/23: Moderate acute pancreatitis with edema of the adjacent gastric wall   2. Transaminitis (trending down)       Hx of ETOH abuse       CT abdomen: Borderline fatty liver       Negative Acute Hepatitis Panel       Continue CIWA protocol  3. Frequent Hiccups      Baclofen 5 mg TID  4. Type 2 Diabetes     Continue Home medications     Continue bedside glucose monitoring    Thank you for allowing me to participate in this patients care.  Plan discussed with Dr. Karel Jarvis and he approves.    Signed By: Terrace Arabia, NP     August 20, 2020

## 2020-08-20 NOTE — Progress Notes (Signed)
Problem: Diabetes Self-Management  Goal: *Disease process and treatment process  Description: Define diabetes and identify own type of diabetes; list 3 options for treating diabetes.  Outcome: Progressing Towards Goal  Goal: *Incorporating nutritional management into lifestyle  Description: Describe effect of type, amount and timing of food on blood glucose; list 3 methods for planning meals.  Outcome: Progressing Towards Goal  Goal: *Incorporating physical activity into lifestyle  Description: State effect of exercise on blood glucose levels.  Outcome: Progressing Towards Goal  Goal: *Developing strategies to promote health/change behavior  Description: Define the ABC's of diabetes; identify appropriate screenings, schedule and personal plan for screenings.  Outcome: Progressing Towards Goal  Goal: *Using medications safely  Description: State effect of diabetes medications on diabetes; name diabetes medication taking, action and side effects.  Outcome: Progressing Towards Goal  Goal: *Monitoring blood glucose, interpreting and using results  Description: Identify recommended blood glucose targets  and personal targets.  Outcome: Progressing Towards Goal  Goal: *Prevention, detection, treatment of acute complications  Description: List symptoms of hyper- and hypoglycemia; describe how to treat low blood sugar and actions for lowering  high blood glucose level.  Outcome: Progressing Towards Goal  Goal: *Prevention, detection and treatment of chronic complications  Description: Define the natural course of diabetes and describe the relationship of blood glucose levels to long term complications of diabetes.  Outcome: Progressing Towards Goal  Goal: *Developing strategies to address psychosocial issues  Description: Describe feelings about living with diabetes; identify support needed and support network  Outcome: Progressing Towards Goal  Goal: *Insulin pump training  Outcome: Progressing Towards Goal  Goal: *Sick  day guidelines  Outcome: Progressing Towards Goal  Goal: *Patient Specific Goal (EDIT GOAL, INSERT TEXT)  Outcome: Progressing Towards Goal     Problem: Patient Education: Go to Patient Education Activity  Goal: Patient/Family Education  Outcome: Progressing Towards Goal     Problem: Falls - Risk of  Goal: *Absence of Falls  Description: Document Schmid Fall Risk and appropriate interventions in the flowsheet.  Outcome: Progressing Towards Goal  Note: Fall Risk Interventions:            Medication Interventions: Bed/chair exit alarm                   Problem: Patient Education: Go to Patient Education Activity  Goal: Patient/Family Education  Outcome: Progressing Towards Goal     Problem: Discharge Planning  Goal: *Discharge to safe environment  Outcome: Progressing Towards Goal  Goal: *Knowledge of medication management  Outcome: Progressing Towards Goal     Problem: Patient Education: Go to Patient Education Activity  Goal: Patient/Family Education  Outcome: Progressing Towards Goal

## 2020-08-20 NOTE — Progress Notes (Signed)
Hospitalist Progress Note    Subjective:   Daily Progress Note: 08/20/2020 7:59 AM    Patient abdominal pain remains 8 out of 10 before he gets his pain medication. His hiccups have improved. Patient denies nausea or vomiting.    Current Facility-Administered Medications   Medication Dose Route Frequency   ??? potassium chloride SR (KLOR-CON 10) tablet 40 mEq  40 mEq Oral DAILY   ??? prochlorperazine (COMPAZINE) injection 5 mg  5 mg IntraVENous Q3H PRN   ??? ketorolac (TORADOL) injection 30 mg  30 mg IntraVENous Q6H PRN   ??? vancomycin (VANCOCIN) 1,000 mg in 0.9% sodium chloride 250 mL (Vial2Bag)  1,000 mg IntraVENous Q12H   ??? Vancomycin trough level 6/25 at 12:00   Other ONCE   ??? VANCOMYCIN INFORMATION NOTE 1 Each  1 Each Other Rx Dosing/Monitoring   ??? baclofen (LIORESAL) tablet 5 mg  5 mg Oral TID   ??? HYDROmorphone (DILAUDID) injection 1 mg  1 mg IntraVENous Q4H PRN   ??? famotidine (PF) (PEPCID) 20 mg in 0.9% sodium chloride 10 mL injection  20 mg IntraVENous Q12H   ??? hydrALAZINE (APRESOLINE) 20 mg/mL injection 10 mg  10 mg IntraVENous QID PRN   ??? sodium chloride (NS) flush 5-40 mL  5-40 mL IntraVENous Q8H   ??? sodium chloride (NS) flush 5-40 mL  5-40 mL IntraVENous PRN   ??? acetaminophen (TYLENOL) tablet 650 mg  650 mg Oral Q6H PRN    Or   ??? acetaminophen (TYLENOL) suppository 650 mg  650 mg Rectal Q6H PRN   ??? polyethylene glycol (MIRALAX) packet 17 g  17 g Oral DAILY PRN   ??? ondansetron (ZOFRAN ODT) tablet 4 mg  4 mg Oral Q8H PRN    Or   ??? ondansetron (ZOFRAN) injection 4 mg  4 mg IntraVENous Q6H PRN   ??? enoxaparin (LOVENOX) injection 40 mg  40 mg SubCUTAneous DAILY   ??? 0.9% sodium chloride infusion  125 mL/hr IntraVENous CONTINUOUS   ??? piperacillin-tazobactam (ZOSYN) 3.375 g in 0.9% sodium chloride (MBP/ADV) 100 mL MBP  3.375 g IntraVENous Q8H   ??? lisinopriL (PRINIVIL, ZESTRIL) tablet 40 mg  40 mg Oral DAILY   ??? metoprolol tartrate (LOPRESSOR) tablet 25 mg  25 mg Oral BID   ??? LORazepam (ATIVAN) injection 1 mg  1 mg  IntraVENous Q2H PRN   ??? chlordiazePOXIDE (LIBRIUM) capsule 10 mg  10 mg Oral TID   ??? glucose chewable tablet 16 g  4 Tablet Oral PRN   ??? dextrose 10% infusion 0-250 mL  0-250 mL IntraVENous PRN   ??? glucagon (GLUCAGEN) injection 1 mg  1 mg IntraMUSCular PRN   ??? insulin NPH (NOVOLIN N, HUMULIN N) injection 17 Units  0.2 Units/kg SubCUTAneous ACB&D   ??? insulin lispro (HUMALOG) injection   SubCUTAneous AC&HS        Review of Systems  Review of Systems   Constitutional: Negative.    Respiratory: Negative.    Cardiovascular: Negative.    Gastrointestinal: Positive for abdominal pain. Negative for nausea and vomiting.   Neurological: Negative for dizziness, tremors and headaches.   All other systems reviewed and are negative.       Objective:     Visit Vitals  BP (!) 170/101   Pulse 87   Temp 97.6 ??F (36.4 ??C)   Resp 18   Ht _0  (1.753 m)   Wt 87.1 kg (192 lb)   SpO2 100%   BMI 28.35 kg/m??  O2 Device: None (Room air)    Temp (24hrs), Avg:98.3 ??F (36.8 ??C), Min:97.6 ??F (36.4 ??C), Max:98.9 ??F (37.2 ??C)      No intake/output data recorded.  06/23 1901 - 06/25 0700  In: 2270 [P.O.:120; I.V.:2150]  Out: -     Recent Results (from the past 24 hour(s))   GLUCOSE, POC    Collection Time: 08/19/20 12:05 PM   Result Value Ref Range    Glucose (POC) 160 (H) 65 - 117 mg/dL    Performed by Everette Rank    GLUCOSE, POC    Collection Time: 08/19/20  4:46 PM   Result Value Ref Range    Glucose (POC) 149 (H) 65 - 117 mg/dL    Performed by Everette Rank    GLUCOSE, POC    Collection Time: 08/19/20 10:22 PM   Result Value Ref Range    Glucose (POC) 88 65 - 117 mg/dL    Performed by Lemmie Evens    GLUCOSE, POC    Collection Time: 08/20/20  7:12 AM   Result Value Ref Range    Glucose (POC) 56 (L) 65 - 117 mg/dL    Performed by Everette Rank    GLUCOSE, POC    Collection Time: 08/20/20  7:14 AM   Result Value Ref Range    Glucose (POC) 66 65 - 117 mg/dL    Performed by Everette Rank    GLUCOSE, POC    Collection Time: 08/20/20  8:14 AM   Result  Value Ref Range    Glucose (POC) 81 65 - 117 mg/dL    Performed by Rometta Emery         [place] ABD LTD   Final Result      CT ABD PELV W CONT   Final Result   Moderate acute pancreatitis with edema of the adjacent gastric wall           PHYSICAL EXAM:    Physical Exam  Vitals and nursing note reviewed.   Constitutional:       Appearance: He is obese.   HENT:      Head: Normocephalic and atraumatic.   Eyes:      General: Scleral icterus present.   Cardiovascular:      Rate and Rhythm: Normal rate and regular rhythm.   Pulmonary:      Effort: No respiratory distress.      Breath sounds: No wheezing.   Abdominal:      General: Bowel sounds are normal. There is no distension.      Palpations: Abdomen is soft.      Tenderness: There is abdominal tenderness.      Comments: Negative for cullen or turners sign    Genitourinary:     Comments: No foley present  Musculoskeletal:      Right lower leg: No edema.      Left lower leg: No edema.   Skin:     General: Skin is warm.   Neurological:      Mental Status: He is alert and oriented to person, place, and time.   Psychiatric:         Mood and Affect: Mood normal.          Data Review    Recent Results (from the past 24 hour(s))   GLUCOSE, POC    Collection Time: 08/19/20 12:05 PM   Result Value Ref Range    Glucose (POC) 160 (H) 65 - 117 mg/dL    Performed  by Everette Rank    GLUCOSE, POC    Collection Time: 08/19/20  4:46 PM   Result Value Ref Range    Glucose (POC) 149 (H) 65 - 117 mg/dL    Performed by Everette Rank    GLUCOSE, POC    Collection Time: 08/19/20 10:22 PM   Result Value Ref Range    Glucose (POC) 88 65 - 117 mg/dL    Performed by Lemmie Evens    GLUCOSE, POC    Collection Time: 08/20/20  7:12 AM   Result Value Ref Range    Glucose (POC) 56 (L) 65 - 117 mg/dL    Performed by Everette Rank    GLUCOSE, POC    Collection Time: 08/20/20  7:14 AM   Result Value Ref Range    Glucose (POC) 66 65 - 117 mg/dL    Performed by Everette Rank    GLUCOSE, POC    Collection Time:  08/20/20  8:14 AM   Result Value Ref Range    Glucose (POC) 81 65 - 117 mg/dL    Performed by Rometta Emery         Assessment/Plan:     Active Problems:    Pancreatitis (08/18/2020)      Acute pancreatitis (08/19/2020)        Hospital Course:    KAHMARI KOLLER Sr. is a 60 y.o. male with a PMH of diabetes, hypertension, alcohol abuse, and hyperlipidemia who presented to the freestanding ED with abdominal pain. In FSED tachycardic and hypertensive. Initial labs significant for WBC of 12.5, sodium of 126, glucose of 273, bilirubin of 1.9, ALT of 241, AST of 347, alk phosphatase of 300, lipase of 1093, lactate of 4.6, and BNP of 541. Urinalysis with proteinuria, glucosuria, ketonuria, hematuria, pyuria and leuk esterase with bacteria. CT of the abdomen showed moderate acute pancreatitis with edema of the adjacent gastric wall. Patient admitted for further work-up. Patient started on IV Pepcid, fluids, Zosyn, and pain control. Blood culture pending. Urine culture pending. GI and general surgery consulted. General surgery deemed not a surgical candidate at this time. Patient WBC, CRP, and procalcitonin elevated. ID consulted. Will add vancomycin for empiric coverage.    Pancreatitis  Lipase [id number]  Continue on IVF and pain control  Advance diet per GI: N.P.O.  GI and general surgery following  ??  Sepsis, POA with tachycardia, WBC elevated and lactic  Down-trending inflammatory markers, trend  Continue on zosyn, vancomycin, and IVF  6/23 blood: NGTD, prelim  ID consulted  ??  Urinary tract infection  6/23 urine: GNR, prelim  Continue on Zosyn   ??  Elevated transamidase  Hepatitis panel non-reactive  ??  Diabetes Mellitus, type II  A1c 11.6  Hold 17 units of NPH while n.p.o.   SSI as needed  ??  Hypertension  Continue on metoprolol and lisinopril  ??  Hyperlipidemia  Hold pravastatin  ??  Alcohol use  Continue on librium, wean as tolerated  CIWA protocol in place  ??  DVT Prophylaxis: lovenox  GI Prophylaxis:  pepcid  Discharge and disposition barriers: clinical improvement, ID consult, 24 - 48 hours    Care Plan discussed with: Patient/Family, Nurse and Case Manager    Total time spent with patient: 35 minutes.

## 2020-08-21 LAB — CBC WITH AUTOMATED DIFF
ABS. BASOPHILS: 0 10*3/uL (ref 0.0–0.1)
ABS. EOSINOPHILS: 0 10*3/uL (ref 0.0–0.4)
ABS. IMM. GRANS.: 0 10*3/uL (ref 0.00–0.04)
ABS. LYMPHOCYTES: 1.2 10*3/uL (ref 0.8–3.5)
ABS. MONOCYTES: 0.6 10*3/uL (ref 0.0–1.0)
ABS. NEUTROPHILS: 3.7 10*3/uL (ref 1.8–8.0)
ABSOLUTE NRBC: 0 10*3/uL (ref 0.00–0.01)
BASOPHILS: 0 % (ref 0–1)
EOSINOPHILS: 1 % (ref 0–7)
HCT: 35.4 % — ABNORMAL LOW (ref 36.6–50.3)
HGB: 11.7 g/dL — ABNORMAL LOW (ref 12.1–17.0)
IMMATURE GRANULOCYTES: 0 % (ref 0–0.5)
LYMPHOCYTES: 21 % (ref 12–49)
MCH: 28.1 PG (ref 26.0–34.0)
MCHC: 33.1 g/dL (ref 30.0–36.5)
MCV: 85.1 FL (ref 80.0–99.0)
MONOCYTES: 10 % (ref 5–13)
MPV: 10.9 FL (ref 8.9–12.9)
NEUTROPHILS: 68 % (ref 32–75)
NRBC: 0 PER 100 WBC
PLATELET: 189 10*3/uL (ref 150–400)
RBC: 4.16 M/uL (ref 4.10–5.70)
RDW: 15.3 % — ABNORMAL HIGH (ref 11.5–14.5)
WBC: 5.5 10*3/uL (ref 4.1–11.1)

## 2020-08-21 LAB — METABOLIC PANEL, COMPREHENSIVE
A-G Ratio: 0.6 — ABNORMAL LOW (ref 1.1–2.2)
ALT (SGPT): 60 U/L (ref 12–78)
AST (SGOT): 39 U/L — ABNORMAL HIGH (ref 15–37)
Albumin: 2.2 g/dL — ABNORMAL LOW (ref 3.5–5.0)
Alk. phosphatase: 180 U/L — ABNORMAL HIGH (ref 45–117)
Anion gap: 6 mmol/L (ref 5–15)
BUN/Creatinine ratio: 7 — ABNORMAL LOW (ref 12–20)
BUN: 7 mg/dL (ref 6–20)
Bilirubin, total: 0.5 mg/dL (ref 0.2–1.0)
CO2: 26 mmol/L (ref 21–32)
Calcium: 7.8 mg/dL — ABNORMAL LOW (ref 8.5–10.1)
Chloride: 111 mmol/L — ABNORMAL HIGH (ref 97–108)
Creatinine: 1.02 mg/dL (ref 0.70–1.30)
GFR est AA: >60 mL/min/{1.73_m2} (ref >60)
GFR est non-AA: >60 mL/min/{1.73_m2} (ref >60)
Globulin: 3.4 g/dL (ref 2.0–4.0)
Glucose: 200 mg/dL — ABNORMAL HIGH (ref 65–100)
Potassium: 3.4 mmol/L — ABNORMAL LOW (ref 3.5–5.1)
Protein, total: 5.6 g/dL — ABNORMAL LOW (ref 6.4–8.2)
Sodium: 143 mmol/L (ref 136–145)

## 2020-08-21 LAB — GLUCOSE, POC
Glucose (POC): 203 mg/dL — ABNORMAL HIGH (ref 65–117)
Glucose (POC): 154 mg/dL — ABNORMAL HIGH (ref 65–117)
Glucose (POC): 153 mg/dL — ABNORMAL HIGH (ref 65–117)
Glucose (POC): 153 mg/dL — ABNORMAL HIGH (ref 65–117)

## 2020-08-21 LAB — LIPASE
Lipase: 2320 U/L — ABNORMAL HIGH (ref 73–393)
Lipase: 2320 U/L — ABNORMAL HIGH (ref 73–393)

## 2020-08-21 LAB — POCT GLUCOSE
POC Glucose: 203 mg/dL — ABNORMAL HIGH (ref 65–117)
POC Glucose: 154 mg/dL — ABNORMAL HIGH (ref 65–117)
POC Glucose: 153 mg/dL — ABNORMAL HIGH (ref 65–117)
POC Glucose: 153 mg/dL — ABNORMAL HIGH (ref 65–117)

## 2020-08-21 LAB — CBC WITH AUTO DIFFERENTIAL
Basophils %: 0 % (ref 0–1)
Basophils Absolute: 0 10*3/uL (ref 0.0–0.1)
Eosinophils %: 1 % (ref 0–7)
Eosinophils Absolute: 0 10*3/uL (ref 0.0–0.4)
Granulocyte Absolute Count: 0 10*3/uL (ref 0.00–0.04)
Hematocrit: 35.4 % — ABNORMAL LOW (ref 36.6–50.3)
Hemoglobin: 11.7 g/dL — ABNORMAL LOW (ref 12.1–17.0)
Immature Granulocytes: 0 % (ref 0–0.5)
Lymphocytes %: 21 % (ref 12–49)
Lymphocytes Absolute: 1.2 10*3/uL (ref 0.8–3.5)
MCH: 28.1 PG (ref 26.0–34.0)
MCHC: 33.1 g/dL (ref 30.0–36.5)
MCV: 85.1 FL (ref 80.0–99.0)
MPV: 10.9 FL (ref 8.9–12.9)
Monocytes %: 10 % (ref 5–13)
Monocytes Absolute: 0.6 10*3/uL (ref 0.0–1.0)
NRBC Absolute: 0 10*3/uL (ref 0.00–0.01)
Neutrophils %: 68 % (ref 32–75)
Neutrophils Absolute: 3.7 10*3/uL (ref 1.8–8.0)
Nucleated RBCs: 0 PER 100 WBC
Platelets: 189 10*3/uL (ref 150–400)
RBC: 4.16 M/uL (ref 4.10–5.70)
RDW: 15.3 % — ABNORMAL HIGH (ref 11.5–14.5)
WBC: 5.5 10*3/uL (ref 4.1–11.1)

## 2020-08-21 LAB — COMPREHENSIVE METABOLIC PANEL
ALT: 60 U/L (ref 12–78)
AST: 39 U/L — ABNORMAL HIGH (ref 15–37)
Albumin/Globulin Ratio: 0.6 — ABNORMAL LOW (ref 1.1–2.2)
Albumin: 2.2 g/dL — ABNORMAL LOW (ref 3.5–5.0)
Alkaline Phosphatase: 180 U/L — ABNORMAL HIGH (ref 45–117)
Anion Gap: 6 mmol/L (ref 5–15)
BUN: 7 mg/dL (ref 6–20)
Bun/Cre Ratio: 7 — ABNORMAL LOW (ref 12–20)
CO2: 26 mmol/L (ref 21–32)
Calcium: 7.8 mg/dL — ABNORMAL LOW (ref 8.5–10.1)
Chloride: 111 mmol/L — ABNORMAL HIGH (ref 97–108)
Creatinine: 1.02 mg/dL (ref 0.70–1.30)
EGFR IF NonAfrican American: >60 mL/min/{1.73_m2} (ref >60)
GFR African American: >60 mL/min/{1.73_m2} (ref >60)
Globulin: 3.4 g/dL (ref 2.0–4.0)
Glucose: 200 mg/dL — ABNORMAL HIGH (ref 65–100)
Potassium: 3.4 mmol/L — ABNORMAL LOW (ref 3.5–5.1)
Sodium: 143 mmol/L (ref 136–145)
Total Bilirubin: 0.5 mg/dL (ref 0.2–1.0)
Total Protein: 5.6 g/dL — ABNORMAL LOW (ref 6.4–8.2)

## 2020-08-21 MED FILL — FAMOTIDINE (PF) 20 MG/2 ML IV: 20 mg/2 mL | INTRAVENOUS | Qty: 2

## 2020-08-21 MED FILL — HUMULIN N NPH U-100 INSULIN (ISOPHANE SUSP) 100 UNIT/ML SUBCUTANEOUS: 100 unit/mL | SUBCUTANEOUS | Qty: 17

## 2020-08-21 MED FILL — PIPERACILLIN-TAZOBACTAM 3.375 GRAM IV SOLR: 3.375 gram | INTRAVENOUS | Qty: 3.38

## 2020-08-21 MED FILL — BACLOFEN 10 MG TAB: 10 mg | ORAL | Qty: 1

## 2020-08-21 MED FILL — METOPROLOL TARTRATE 25 MG TAB: 25 mg | ORAL | Qty: 1

## 2020-08-21 MED FILL — KETOROLAC TROMETHAMINE 30 MG/ML INJECTION: 30 mg/mL (1 mL) | INTRAMUSCULAR | Qty: 1

## 2020-08-21 MED FILL — HUMALOG U-100 INSULIN 100 UNIT/ML SUBCUTANEOUS SOLUTION: 100 unit/mL | SUBCUTANEOUS | Qty: 3

## 2020-08-21 MED FILL — LISINOPRIL 40 MG TAB: 40 mg | ORAL | Qty: 1

## 2020-08-21 MED FILL — PROCHLORPERAZINE EDISYLATE 5 MG/ML INJECTION: 5 mg/mL | INTRAMUSCULAR | Qty: 2

## 2020-08-21 MED FILL — HUMALOG U-100 INSULIN 100 UNIT/ML SUBCUTANEOUS SOLUTION: 100 unit/mL | SUBCUTANEOUS | Qty: 2

## 2020-08-21 MED FILL — POTASSIUM CHLORIDE SR 10 MEQ TAB: 10 mEq | ORAL | Qty: 4

## 2020-08-21 MED FILL — VANCOMYCIN 750 MG IV SOLUTION: 750 mg | INTRAVENOUS | Qty: 750

## 2020-08-21 MED FILL — CHLORDIAZEPOXIDE 5 MG CAP: 5 mg | ORAL | Qty: 1

## 2020-08-21 MED FILL — ENOXAPARIN 40 MG/0.4 ML SUB-Q SYRINGE: 40 mg/0.4 mL | SUBCUTANEOUS | Qty: 0.4

## 2020-08-21 MED FILL — HYDROMORPHONE (PF) 1 MG/ML IJ SOLN: 1 mg/mL | INTRAMUSCULAR | Qty: 1

## 2020-08-21 NOTE — Progress Notes (Signed)
Problem: Diabetes Self-Management  Goal: *Disease process and treatment process  Description: Define diabetes and identify own type of diabetes; list 3 options for treating diabetes.  Outcome: Progressing Towards Goal  Goal: *Using medications safely  Description: State effect of diabetes medications on diabetes; name diabetes medication taking, action and side effects.  Outcome: Progressing Towards Goal  Goal: *Monitoring blood glucose, interpreting and using results  Description: Identify recommended blood glucose targets  and personal targets.  Outcome: Progressing Towards Goal  Goal: *Prevention, detection and treatment of chronic complications  Description: Define the natural course of diabetes and describe the relationship of blood glucose levels to long term complications of diabetes.  Outcome: Progressing Towards Goal  Goal: *Developing strategies to address psychosocial issues  Description: Describe feelings about living with diabetes; identify support needed and support network  Outcome: Progressing Towards Goal

## 2020-08-21 NOTE — Progress Notes (Signed)
Hospitalist Progress Note    Subjective:   Daily Progress Note: 08/21/2020 7:59 AM    Patient tolerating clear liquids without nausea or vomiting but remains with abdominal pain. Patient denies lightheadedness, headache, tremors, chest pain, or shortness of breath.    Current Facility-Administered Medications   Medication Dose Route Frequency   ??? chlordiazePOXIDE (LIBRIUM) capsule 5 mg  5 mg Oral TID   ??? vancomycin (VANCOCIN) 750 mg in 0.9% sodium chloride 250 mL (Vial2Bag)  750 mg IntraVENous Q12H   ??? potassium chloride SR (KLOR-CON 10) tablet 40 mEq  40 mEq Oral DAILY   ??? prochlorperazine (COMPAZINE) injection 5 mg  5 mg IntraVENous Q3H PRN   ??? ketorolac (TORADOL) injection 30 mg  30 mg IntraVENous Q6H PRN   ??? VANCOMYCIN INFORMATION NOTE 1 Each  1 Each Other Rx Dosing/Monitoring   ??? baclofen (LIORESAL) tablet 5 mg  5 mg Oral TID   ??? HYDROmorphone (DILAUDID) injection 1 mg  1 mg IntraVENous Q4H PRN   ??? famotidine (PF) (PEPCID) 20 mg in 0.9% sodium chloride 10 mL injection  20 mg IntraVENous Q12H   ??? hydrALAZINE (APRESOLINE) 20 mg/mL injection 10 mg  10 mg IntraVENous QID PRN   ??? sodium chloride (NS) flush 5-40 mL  5-40 mL IntraVENous Q8H   ??? sodium chloride (NS) flush 5-40 mL  5-40 mL IntraVENous PRN   ??? acetaminophen (TYLENOL) tablet 650 mg  650 mg Oral Q6H PRN    Or   ??? acetaminophen (TYLENOL) suppository 650 mg  650 mg Rectal Q6H PRN   ??? polyethylene glycol (MIRALAX) packet 17 g  17 g Oral DAILY PRN   ??? ondansetron (ZOFRAN ODT) tablet 4 mg  4 mg Oral Q8H PRN    Or   ??? ondansetron (ZOFRAN) injection 4 mg  4 mg IntraVENous Q6H PRN   ??? enoxaparin (LOVENOX) injection 40 mg  40 mg SubCUTAneous DAILY   ??? 0.9% sodium chloride infusion  125 mL/hr IntraVENous CONTINUOUS   ??? piperacillin-tazobactam (ZOSYN) 3.375 g in 0.9% sodium chloride (MBP/ADV) 100 mL MBP  3.375 g IntraVENous Q8H   ??? lisinopriL (PRINIVIL, ZESTRIL) tablet 40 mg  40 mg Oral DAILY   ??? metoprolol tartrate (LOPRESSOR) tablet 25 mg  25 mg Oral BID   ???  LORazepam (ATIVAN) injection 1 mg  1 mg IntraVENous Q2H PRN   ??? glucose chewable tablet 16 g  4 Tablet Oral PRN   ??? dextrose 10% infusion 0-250 mL  0-250 mL IntraVENous PRN   ??? glucagon (GLUCAGEN) injection 1 mg  1 mg IntraMUSCular PRN   ??? insulin NPH (NOVOLIN N, HUMULIN N) injection 17 Units  0.2 Units/kg SubCUTAneous ACB&D   ??? insulin lispro (HUMALOG) injection   SubCUTAneous AC&HS        Review of Systems  Review of Systems   Constitutional: Negative.    Respiratory: Negative.    Cardiovascular: Negative.    Gastrointestinal: Positive for abdominal pain. Negative for nausea and vomiting.   Neurological: Negative for dizziness, tremors and headaches.   All other systems reviewed and are negative.       Objective:     Visit Vitals  BP (!) 146/98 (BP 1 Location: Right upper arm, BP Patient Position: Sitting)   Pulse 98   Temp 97.7 ??F (36.5 ??C)   Resp 18   Ht '5\' 9"'$  (1.753 m)   Wt 87.1 kg (192 lb)   SpO2 98%   BMI 28.35 kg/m??      O2  Device: None (Room air)    Temp (24hrs), Avg:97.6 ??F (36.4 ??C), Min:97.4 ??F (36.3 ??C), Max:97.7 ??F (36.5 ??C)      No intake/output data recorded.  06/24 1901 - 06/26 0700  In: 6010 [P.O.:1950; I.V.:2575]  Out: -     Recent Results (from the past 24 hour(s))   METABOLIC PANEL, COMPREHENSIVE    Collection Time: 08/20/20  9:10 AM   Result Value Ref Range    Sodium 139 136 - 145 mmol/L    Potassium 3.1 (L) 3.5 - 5.1 mmol/L    Chloride 108 97 - 108 mmol/L    CO2 20 (L) 21 - 32 mmol/L    Anion gap 11 5 - 15 mmol/L    Glucose 87 65 - 100 mg/dL    BUN 14 6 - 20 mg/dL    Creatinine 1.20 0.70 - 1.30 mg/dL    BUN/Creatinine ratio 12 12 - 20      GFR est AA >60 >60 ml/min/1.69m    GFR est non-AA >60 >60 ml/min/1.7102m   Calcium 8.0 (L) 8.5 - 10.1 mg/dL    Bilirubin, total 1.1 (H) 0.2 - 1.0 mg/dL    AST (SGOT) 63 (H) 15 - 37 U/L    ALT (SGPT) 87 (H) 12 - 78 U/L    Alk. phosphatase 191 (H) 45 - 117 U/L    Protein, total 7.2 6.4 - 8.2 g/dL    Albumin 2.6 (L) 3.5 - 5.0 g/dL    Globulin 4.6 (H) 2.0 - 4.0  g/dL    A-G Ratio 0.6 (L) 1.1 - 2.2     LIPASE    Collection Time: 08/20/20  9:10 AM   Result Value Ref Range    Lipase 1,045 (H) 73 - 393 U/L   C REACTIVE PROTEIN, QT    Collection Time: 08/20/20  9:10 AM   Result Value Ref Range    C-Reactive protein 24.60 (H) 0.00 - 0.60 mg/dL   GLUCOSE, POC    Collection Time: 08/20/20 11:39 AM   Result Value Ref Range    Glucose (POC) 89 65 - 117 mg/dL    Performed by ToRometta Emery  GLUCOSE, POC    Collection Time: 08/20/20 12:07 PM   Result Value Ref Range    Glucose (POC) 87 65 - 117 mg/dL    Performed by ZoBarbaraann BarthelTROUGH    Collection Time: 08/20/20  4:47 PM   Result Value Ref Range    Vancomycin,trough 6.5 5.0 - 10.0 ug/mL   CBC WITH AUTOMATED DIFF    Collection Time: 08/20/20  4:47 PM   Result Value Ref Range    WBC 8.4 4.1 - 11.1 K/uL    RBC 4.49 4.10 - 5.70 M/uL    HGB 12.5 12.1 - 17.0 g/dL    HCT 39.3 36.6 - 50.3 %    MCV 87.5 80.0 - 99.0 FL    MCH 27.8 26.0 - 34.0 PG    MCHC 31.8 30.0 - 36.5 g/dL    RDW 15.7 (H) 11.5 - 14.5 %    PLATELET 167 150 - 400 K/uL    MPV 11.0 8.9 - 12.9 FL    NRBC 0.0 0.0 PER 100 WBC    ABSOLUTE NRBC 0.00 0.00 - 0.01 K/uL    NEUTROPHILS 76 (H) 32 - 75 %    LYMPHOCYTES 13 12 - 49 %    MONOCYTES 10 5 - 13 %    EOSINOPHILS 1  0 - 7 %    BASOPHILS 0 0 - 1 %    IMMATURE GRANULOCYTES 0 0 - 0.5 %    ABS. NEUTROPHILS 6.4 1.8 - 8.0 K/UL    ABS. LYMPHOCYTES 1.1 0.8 - 3.5 K/UL    ABS. MONOCYTES 0.8 0.0 - 1.0 K/UL    ABS. EOSINOPHILS 0.1 0.0 - 0.4 K/UL    ABS. BASOPHILS 0.0 0.0 - 0.1 K/UL    ABS. IMM. GRANS. 0.0 0.00 - 0.04 K/UL    DF AUTOMATED     GLUCOSE, POC    Collection Time: 08/20/20  5:12 PM   Result Value Ref Range    Glucose (POC) 127 (H) 65 - 117 mg/dL    Performed by Everette Rank    GLUCOSE, POC    Collection Time: 08/20/20  9:36 PM   Result Value Ref Range    Glucose (POC) 203 (H) 65 - 117 mg/dL    Performed by Julious Payer    GLUCOSE, POC    Collection Time: 08/21/20  6:42 AM   Result Value Ref Range    Glucose (POC)  154 (H) 65 - 117 mg/dL    Performed by Julious Payer         [place] ABD LTD   Final Result      CT ABD PELV W CONT   Final Result   Moderate acute pancreatitis with edema of the adjacent gastric wall           PHYSICAL EXAM:    Physical Exam  Vitals and nursing note reviewed.   Constitutional:       Appearance: He is obese.   HENT:      Head: Normocephalic and atraumatic.   Eyes:      General: Scleral icterus present.   Cardiovascular:      Rate and Rhythm: Normal rate and regular rhythm.   Pulmonary:      Effort: No respiratory distress.      Breath sounds: No wheezing.   Abdominal:      General: Bowel sounds are normal. There is no distension.      Palpations: Abdomen is soft.      Tenderness: There is abdominal tenderness.      Comments: Negative for cullen or turners sign    Genitourinary:     Comments: No foley present  Musculoskeletal:      Right lower leg: No edema.      Left lower leg: No edema.   Skin:     General: Skin is warm.   Neurological:      Mental Status: He is alert and oriented to person, place, and time.   Psychiatric:         Mood and Affect: Mood normal.          Data Review    Recent Results (from the past 24 hour(s))   METABOLIC PANEL, COMPREHENSIVE    Collection Time: 08/20/20  9:10 AM   Result Value Ref Range    Sodium 139 136 - 145 mmol/L    Potassium 3.1 (L) 3.5 - 5.1 mmol/L    Chloride 108 97 - 108 mmol/L    CO2 20 (L) 21 - 32 mmol/L    Anion gap 11 5 - 15 mmol/L    Glucose 87 65 - 100 mg/dL    BUN 14 6 - 20 mg/dL    Creatinine 1.20 0.70 - 1.30 mg/dL    BUN/Creatinine ratio 12 12 - 20  GFR est AA >60 >60 ml/min/1.43m    GFR est non-AA >60 >60 ml/min/1.764m   Calcium 8.0 (L) 8.5 - 10.1 mg/dL    Bilirubin, total 1.1 (H) 0.2 - 1.0 mg/dL    AST (SGOT) 63 (H) 15 - 37 U/L    ALT (SGPT) 87 (H) 12 - 78 U/L    Alk. phosphatase 191 (H) 45 - 117 U/L    Protein, total 7.2 6.4 - 8.2 g/dL    Albumin 2.6 (L) 3.5 - 5.0 g/dL    Globulin 4.6 (H) 2.0 - 4.0 g/dL    A-G Ratio 0.6 (L) 1.1 - 2.2      LIPASE    Collection Time: 08/20/20  9:10 AM   Result Value Ref Range    Lipase 1,045 (H) 73 - 393 U/L   C REACTIVE PROTEIN, QT    Collection Time: 08/20/20  9:10 AM   Result Value Ref Range    C-Reactive protein 24.60 (H) 0.00 - 0.60 mg/dL   GLUCOSE, POC    Collection Time: 08/20/20 11:39 AM   Result Value Ref Range    Glucose (POC) 89 65 - 117 mg/dL    Performed by ToRometta Emery  GLUCOSE, POC    Collection Time: 08/20/20 12:07 PM   Result Value Ref Range    Glucose (POC) 87 65 - 117 mg/dL    Performed by ZoBarbaraann BarthelTROUGH    Collection Time: 08/20/20  4:47 PM   Result Value Ref Range    Vancomycin,trough 6.5 5.0 - 10.0 ug/mL   CBC WITH AUTOMATED DIFF    Collection Time: 08/20/20  4:47 PM   Result Value Ref Range    WBC 8.4 4.1 - 11.1 K/uL    RBC 4.49 4.10 - 5.70 M/uL    HGB 12.5 12.1 - 17.0 g/dL    HCT 39.3 36.6 - 50.3 %    MCV 87.5 80.0 - 99.0 FL    MCH 27.8 26.0 - 34.0 PG    MCHC 31.8 30.0 - 36.5 g/dL    RDW 15.7 (H) 11.5 - 14.5 %    PLATELET 167 150 - 400 K/uL    MPV 11.0 8.9 - 12.9 FL    NRBC 0.0 0.0 PER 100 WBC    ABSOLUTE NRBC 0.00 0.00 - 0.01 K/uL    NEUTROPHILS 76 (H) 32 - 75 %    LYMPHOCYTES 13 12 - 49 %    MONOCYTES 10 5 - 13 %    EOSINOPHILS 1 0 - 7 %    BASOPHILS 0 0 - 1 %    IMMATURE GRANULOCYTES 0 0 - 0.5 %    ABS. NEUTROPHILS 6.4 1.8 - 8.0 K/UL    ABS. LYMPHOCYTES 1.1 0.8 - 3.5 K/UL    ABS. MONOCYTES 0.8 0.0 - 1.0 K/UL    ABS. EOSINOPHILS 0.1 0.0 - 0.4 K/UL    ABS. BASOPHILS 0.0 0.0 - 0.1 K/UL    ABS. IMM. GRANS. 0.0 0.00 - 0.04 K/UL    DF AUTOMATED     GLUCOSE, POC    Collection Time: 08/20/20  5:12 PM   Result Value Ref Range    Glucose (POC) 127 (H) 65 - 117 mg/dL    Performed by ZoEverette Rank  GLUCOSE, POC    Collection Time: 08/20/20  9:36 PM   Result Value Ref Range    Glucose (POC) 203 (H) 65 - 117 mg/dL    Performed by  Mendegilla Jalloh    GLUCOSE, POC    Collection Time: 08/21/20  6:42 AM   Result Value Ref Range    Glucose (POC) 154 (H) 65 - 117 mg/dL    Performed by  Julious Payer         Assessment/Plan:     Active Problems:    Pancreatitis (08/18/2020)      Acute pancreatitis (08/19/2020)      Acute cystitis with hematuria (08/20/2020)        Hospital Course:    Antonio CREDIT Sr. is a 60 y.o. male with a PMH of diabetes, hypertension, alcohol abuse, and hyperlipidemia who presented to the freestanding ED with abdominal pain. In FSED tachycardic and hypertensive. Initial labs significant for WBC of 12.5, sodium of 126, glucose of 273, bilirubin of 1.9, ALT of 241, AST of 347, alk phosphatase of 300, lipase of 1093, lactate of 4.6, and BNP of 541. Urinalysis with proteinuria, glucosuria, ketonuria, hematuria, pyuria and leuk esterase with bacteria. CT of the abdomen showed moderate acute pancreatitis with edema of the adjacent gastric wall. Patient admitted for further work-up. Patient started on IV Pepcid, fluids, Zosyn, and pain control. Blood culture pending. GI and general surgery consulted. General surgery deemed not a surgical candidate at this time. Patient WBC, CRP, and procalcitonin elevated. ID consulted. Will add vancomycin for empiric coverage. No growth to date with blood culture we will discontinue vancomycin. Urine culture grew E Coli. Labs pending today.    Pancreatitis  Lipase [id number]  Continue on IVF and pain control  Advance diet per GI: clear liquid  GI and general surgery following  ??  Sepsis, POA with tachycardia, WBC elevated and lactic  Down-trending inflammatory markers, trend  Continue on zosyn and IVF  No growth to date with blood culture we will discontinue vancomycin  6/23 blood: NGTD, prelim  ID consulted  ??  Urinary tract infection  6/23 urine: E Coli  Continue on Zosyn, remain on while inpatient  ??  Elevated transamidase  Hepatitis panel non-reactive  ??  Diabetes Mellitus, type II  A1c 11.6  Continue of 17 units of NPH  SSI as needed  ??  Hypertension  Continue on metoprolol and lisinopril  ??  Hyperlipidemia  Hold  pravastatin  ??  Alcohol use  Continue on librium, wean as tolerated  CIWA protocol in place  ??  DVT Prophylaxis: lovenox  GI Prophylaxis: pepcid  Discharge and disposition barriers: clinical improvement, ID consult, 24 - 48 hours    Care Plan discussed with: Patient/Family, Nurse and Case Manager    Total time spent with patient: 35 minutes.

## 2020-08-21 NOTE — Progress Notes (Signed)
Progress Note    Patient: Antonio SESSLER Sr. MRN: [id number]  SSN: [id number]    Date of Birth: 1961/02/20  Age: 60 y.o.  Sex: male      Admit Date: 08/18/2020    LOS: 2 days     Subjective:   GI in consultation for Pancreatitis.  ??  6/26: Patient awake and alert. Still with mid-abdominal pain,  but improved with pain med. Denies nausea or vomiting. Lipase 1,045. On clear liquid diet.   ??  History of Present Illness:??Antonio Bast Sr.??is a 60 y.o.??male??who is seen in consultation for Pancreatitis. Antonio Melendez presented to the ED with complaints of abdominal pain, nausea, and vomiting. He reports his abdominal pain is on and off. He reports he has been drinking about a 12 pack of beer or bottles of alcohol nearly every day for the past 3 weeks. He denies fever, hematemesis, or hemoptysis. He denies tobacco use.His last alcoholic drink was on Tuesday. He does have abdominal tenderness with light palpation. He reports frequent hiccups. He has a past medical history significant for hypertension, type 2 diabetes, insulin dependent, and hyperlipidemia. His lipase on admission was over 1,000 currently it is 424. He is NPO with IV hydration. Potassium low at 2.9 this morning, he was given IV potassium. Total bilirubin 1.6, ALT 124, AST 111, Alk Phosphatase 200. CT abdomen shows moderate acute pancreatitis with edema of the adjacent gastric wall and borderline fatty liver. His acute hepatitis panel is negative.     Objective:     Vitals:    08/20/20 1706 08/20/20 1836 08/20/20 2056 08/21/20 1022   BP: (!) 164/106 (!) 151/94 (!) 146/98 (!) 156/88   Pulse: 98 98 98 98   Resp:   18    Temp:   97.7 ??F (36.5 ??C)    SpO2:   98%    Weight:       Height:            Intake and Output:  Current Shift: No intake/output data recorded.  Last three shifts: 06/24 1901 - 06/26 0700  In: 7829 [P.O.:1950; I.V.:2575]  Out: -     Physical Exam:   Skin:  Extremities and face reveal no rashes. No palmer erythema.   HEENT: Sclerae  anicteric. Extra-occular muscles are intact. No abnormal pigmentation of the lips. The neck is supple.  Cardiovascular: Regular rate and rhythm.  Respiratory:  Comfortable breathing with no accessory muscle use.   GI:  Abdomen distended, soft, and tender.   Rectal:  Deferred  Musculoskeletal: Extremities have good range of motion.  Neurological:  Gross memory appears intact.  Patient is alert and oriented.  Psychiatric:  Mood appears appropriate with judgement intact.  Lymphatic:  No visible adenopathy      Lab/Data Review:  Recent Results (from the past 24 hour(s))   GLUCOSE, POC    Collection Time: 08/20/20 12:07 PM   Result Value Ref Range    Glucose (POC) 87 65 - 117 mg/dL    Performed by Barbaraann Barthel, TROUGH    Collection Time: 08/20/20  4:47 PM   Result Value Ref Range    Vancomycin,trough 6.5 5.0 - 10.0 ug/mL   CBC WITH AUTOMATED DIFF    Collection Time: 08/20/20  4:47 PM   Result Value Ref Range    WBC 8.4 4.1 - 11.1 K/uL    RBC 4.49 4.10 - 5.70 M/uL    HGB 12.5 12.1 - 17.0 g/dL  HCT 39.3 36.6 - 50.3 %    MCV 87.5 80.0 - 99.0 FL    MCH 27.8 26.0 - 34.0 PG    MCHC 31.8 30.0 - 36.5 g/dL    RDW 15.7 (H) 11.5 - 14.5 %    PLATELET 167 150 - 400 K/uL    MPV 11.0 8.9 - 12.9 FL    NRBC 0.0 0.0 PER 100 WBC    ABSOLUTE NRBC 0.00 0.00 - 0.01 K/uL    NEUTROPHILS 76 (H) 32 - 75 %    LYMPHOCYTES 13 12 - 49 %    MONOCYTES 10 5 - 13 %    EOSINOPHILS 1 0 - 7 %    BASOPHILS 0 0 - 1 %    IMMATURE GRANULOCYTES 0 0 - 0.5 %    ABS. NEUTROPHILS 6.4 1.8 - 8.0 K/UL    ABS. LYMPHOCYTES 1.1 0.8 - 3.5 K/UL    ABS. MONOCYTES 0.8 0.0 - 1.0 K/UL    ABS. EOSINOPHILS 0.1 0.0 - 0.4 K/UL    ABS. BASOPHILS 0.0 0.0 - 0.1 K/UL    ABS. IMM. GRANS. 0.0 0.00 - 0.04 K/UL    DF AUTOMATED     GLUCOSE, POC    Collection Time: 08/20/20  5:12 PM   Result Value Ref Range    Glucose (POC) 127 (H) 65 - 117 mg/dL    Performed by Everette Rank    GLUCOSE, POC    Collection Time: 08/20/20  9:36 PM   Result Value Ref Range    Glucose (POC) 203 (H) 65  - 117 mg/dL    Performed by Julious Payer    GLUCOSE, POC    Collection Time: 08/21/20  6:42 AM   Result Value Ref Range    Glucose (POC) 154 (H) 65 - 117 mg/dL    Performed by Julious Payer               [place] ABD LTD   Final Result      CT ABD PELV W CONT   Final Result   Moderate acute pancreatitis with edema of the adjacent gastric wall          Assessment:     Active Problems:    Pancreatitis (08/18/2020)      Acute pancreatitis (08/19/2020)      Acute cystitis with hematuria (08/20/2020)        Plan:   1. Pancreatitis  ??????clear Liquid diet  ??????Continue IV hydration  ??????Lipase in the am  ??????CT abdomen 6/23: Moderate acute pancreatitis with edema of the adjacent gastric wall  ??2. Transaminitis (trending down)  ??????????Hx of ETOH abuse  ??????????CT abdomen: Borderline fatty liver  ??????????Negative Acute Hepatitis Panel  ??????????Continue CIWA protocol  3. Frequent Hiccups  ????????Baclofen 5 mg TID  4. Type 2 Diabetes  ??????Continue Home medications  ??????Continue bedside glucose monitoring    Patient discussed with Dr Karel Jarvis and agrees to above impression and plan.  Thank you for allowing me to participate in this patients care    Signed By: Vick Frees. [place], NP     August 21, 2020

## 2020-08-22 ENCOUNTER — Inpatient Hospital Stay: Admit: 2020-08-23 | Payer: BLUE CROSS/BLUE SHIELD | Primary: Family Medicine

## 2020-08-22 LAB — CBC WITH AUTOMATED DIFF
ABS. BASOPHILS: 0 10*3/uL (ref 0.0–0.1)
ABS. EOSINOPHILS: 0 10*3/uL (ref 0.0–0.4)
ABS. IMM. GRANS.: 0 10*3/uL (ref 0.00–0.04)
ABS. LYMPHOCYTES: 1.3 10*3/uL (ref 0.8–3.5)
ABS. MONOCYTES: 0.8 10*3/uL (ref 0.0–1.0)
ABS. NEUTROPHILS: 4.6 10*3/uL (ref 1.8–8.0)
ABSOLUTE NRBC: 0 10*3/uL (ref 0.00–0.01)
BASOPHILS: 0 % (ref 0–1)
EOSINOPHILS: 0 % (ref 0–7)
HCT: 33.2 % — ABNORMAL LOW (ref 36.6–50.3)
HGB: 11 g/dL — ABNORMAL LOW (ref 12.1–17.0)
IMMATURE GRANULOCYTES: 0 % (ref 0–0.5)
LYMPHOCYTES: 20 % (ref 12–49)
MCH: 27.8 PG (ref 26.0–34.0)
MCHC: 33.1 g/dL (ref 30.0–36.5)
MCV: 83.8 FL (ref 80.0–99.0)
MONOCYTES: 11 % (ref 5–13)
MPV: 11.2 FL (ref 8.9–12.9)
NEUTROPHILS: 69 % (ref 32–75)
NRBC: 0 PER 100 WBC
PLATELET: 226 10*3/uL (ref 150–400)
RBC: 3.96 M/uL — ABNORMAL LOW (ref 4.10–5.70)
RDW: 14.7 % — ABNORMAL HIGH (ref 11.5–14.5)
WBC: 6.7 10*3/uL (ref 4.1–11.1)

## 2020-08-22 LAB — GLUCOSE, POC
Glucose (POC): 155 mg/dL — ABNORMAL HIGH (ref 65–117)
Glucose (POC): 89 mg/dL (ref 65–117)
Glucose (POC): 138 mg/dL — ABNORMAL HIGH (ref 65–117)

## 2020-08-22 LAB — METABOLIC PANEL, COMPREHENSIVE
A-G Ratio: 0.5 — ABNORMAL LOW (ref 1.1–2.2)
ALT (SGPT): 60 U/L (ref 12–78)
AST (SGOT): 39 U/L — ABNORMAL HIGH (ref 15–37)
Albumin: 2.4 g/dL — ABNORMAL LOW (ref 3.5–5.0)
Alk. phosphatase: 178 U/L — ABNORMAL HIGH (ref 45–117)
Anion gap: 8 mmol/L (ref 5–15)
BUN/Creatinine ratio: 5 — ABNORMAL LOW (ref 12–20)
BUN: 5 mg/dL — ABNORMAL LOW (ref 6–20)
Bilirubin, total: 0.7 mg/dL (ref 0.2–1.0)
CO2: 24 mmol/L (ref 21–32)
Calcium: 8.3 mg/dL — ABNORMAL LOW (ref 8.5–10.1)
Chloride: 103 mmol/L (ref 97–108)
Creatinine: 1.08 mg/dL (ref 0.70–1.30)
GFR est AA: >60 mL/min/{1.73_m2} (ref >60)
GFR est non-AA: >60 mL/min/{1.73_m2} (ref >60)
Globulin: 4.6 g/dL — ABNORMAL HIGH (ref 2.0–4.0)
Glucose: 148 mg/dL — ABNORMAL HIGH (ref 65–100)
Potassium: 2.6 mmol/L — CL (ref 3.5–5.1)
Protein, total: 7 g/dL (ref 6.4–8.2)
Sodium: 135 mmol/L — ABNORMAL LOW (ref 136–145)

## 2020-08-22 LAB — LIPASE
Lipase: 2021 U/L — ABNORMAL HIGH (ref 73–393)
Lipase: 2021 U/L — ABNORMAL HIGH (ref 73–393)

## 2020-08-22 LAB — CBC WITH AUTO DIFFERENTIAL
Basophils %: 0 % (ref 0–1)
Basophils Absolute: 0 10*3/uL (ref 0.0–0.1)
Eosinophils %: 0 % (ref 0–7)
Eosinophils Absolute: 0 10*3/uL (ref 0.0–0.4)
Granulocyte Absolute Count: 0 10*3/uL (ref 0.00–0.04)
Hematocrit: 33.2 % — ABNORMAL LOW (ref 36.6–50.3)
Hemoglobin: 11 g/dL — ABNORMAL LOW (ref 12.1–17.0)
Immature Granulocytes: 0 % (ref 0–0.5)
Lymphocytes %: 20 % (ref 12–49)
Lymphocytes Absolute: 1.3 10*3/uL (ref 0.8–3.5)
MCH: 27.8 PG (ref 26.0–34.0)
MCHC: 33.1 g/dL (ref 30.0–36.5)
MCV: 83.8 FL (ref 80.0–99.0)
MPV: 11.2 FL (ref 8.9–12.9)
Monocytes %: 11 % (ref 5–13)
Monocytes Absolute: 0.8 10*3/uL (ref 0.0–1.0)
NRBC Absolute: 0 10*3/uL (ref 0.00–0.01)
Neutrophils %: 69 % (ref 32–75)
Neutrophils Absolute: 4.6 10*3/uL (ref 1.8–8.0)
Nucleated RBCs: 0 PER 100 WBC
Platelets: 226 10*3/uL (ref 150–400)
RBC: 3.96 M/uL — ABNORMAL LOW (ref 4.10–5.70)
RDW: 14.7 % — ABNORMAL HIGH (ref 11.5–14.5)
WBC: 6.7 10*3/uL (ref 4.1–11.1)

## 2020-08-22 LAB — POCT GLUCOSE
POC Glucose: 155 mg/dL — ABNORMAL HIGH (ref 65–117)
POC Glucose: 89 mg/dL (ref 65–117)
POC Glucose: 138 mg/dL — ABNORMAL HIGH (ref 65–117)

## 2020-08-22 LAB — COMPREHENSIVE METABOLIC PANEL
ALT: 60 U/L (ref 12–78)
AST: 39 U/L — ABNORMAL HIGH (ref 15–37)
Albumin/Globulin Ratio: 0.5 — ABNORMAL LOW (ref 1.1–2.2)
Albumin: 2.4 g/dL — ABNORMAL LOW (ref 3.5–5.0)
Alkaline Phosphatase: 178 U/L — ABNORMAL HIGH (ref 45–117)
Anion Gap: 8 mmol/L (ref 5–15)
BUN: 5 mg/dL — ABNORMAL LOW (ref 6–20)
Bun/Cre Ratio: 5 — ABNORMAL LOW (ref 12–20)
CO2: 24 mmol/L (ref 21–32)
Calcium: 8.3 mg/dL — ABNORMAL LOW (ref 8.5–10.1)
Chloride: 103 mmol/L (ref 97–108)
Creatinine: 1.08 mg/dL (ref 0.70–1.30)
EGFR IF NonAfrican American: >60 mL/min/{1.73_m2} (ref >60)
GFR African American: >60 mL/min/{1.73_m2} (ref >60)
Globulin: 4.6 g/dL — ABNORMAL HIGH (ref 2.0–4.0)
Glucose: 148 mg/dL — ABNORMAL HIGH (ref 65–100)
Potassium: 2.6 mmol/L — CL (ref 3.5–5.1)
Sodium: 135 mmol/L — ABNORMAL LOW (ref 136–145)
Total Bilirubin: 0.7 mg/dL (ref 0.2–1.0)
Total Protein: 7 g/dL (ref 6.4–8.2)

## 2020-08-22 MED ORDER — SODIUM CHLORIDE 0.9 % INJECTION
1 gram | INTRAMUSCULAR | Status: AC
Start: 2020-08-22 — End: 2020-08-22
  Administered 2020-08-22: 20:00:00 via INTRAVENOUS

## 2020-08-22 MED ORDER — NS WITH POTASSIUM CHLORIDE 40 MEQ/L IV
40 mEq/L | INTRAVENOUS | Status: DC
Start: 2020-08-22 — End: 2020-08-26
  Administered 2020-08-22 – 2020-08-26 (×11): via INTRAVENOUS

## 2020-08-22 MED ORDER — SODIUM CHLORIDE 0.9 % IV PIGGY BACK
1 gram | Freq: Three times a day (TID) | INTRAVENOUS | Status: DC
Start: 2020-08-22 — End: 2020-08-22

## 2020-08-22 MED ORDER — MEROPENEM 1 GRAM IV SOLR
1 gram | Freq: Three times a day (TID) | INTRAVENOUS | Status: DC
Start: 2020-08-22 — End: 2020-08-26
  Administered 2020-08-23 – 2020-08-26 (×11): via INTRAVENOUS

## 2020-08-22 MED ORDER — AMOXICILLIN 500 MG CAP
500 mg | Freq: Three times a day (TID) | ORAL | Status: DC
Start: 2020-08-22 — End: 2020-08-22

## 2020-08-22 MED FILL — PROCHLORPERAZINE EDISYLATE 5 MG/ML INJECTION: 5 mg/mL | INTRAMUSCULAR | Qty: 2

## 2020-08-22 MED FILL — KETOROLAC TROMETHAMINE 30 MG/ML INJECTION: 30 mg/mL (1 mL) | INTRAMUSCULAR | Qty: 1

## 2020-08-22 MED FILL — ONDANSETRON (PF) 4 MG/2 ML INJECTION: 4 mg/2 mL | INTRAMUSCULAR | Qty: 2

## 2020-08-22 MED FILL — HUMALOG U-100 INSULIN 100 UNIT/ML SUBCUTANEOUS SOLUTION: 100 unit/mL | SUBCUTANEOUS | Qty: 3

## 2020-08-22 MED FILL — ENOXAPARIN 40 MG/0.4 ML SUB-Q SYRINGE: 40 mg/0.4 mL | SUBCUTANEOUS | Qty: 0.4

## 2020-08-22 MED FILL — BACLOFEN 10 MG TAB: 10 mg | ORAL | Qty: 1

## 2020-08-22 MED FILL — FAMOTIDINE (PF) 20 MG/2 ML IV: 20 mg/2 mL | INTRAVENOUS | Qty: 2

## 2020-08-22 MED FILL — CHLORDIAZEPOXIDE 5 MG CAP: 5 mg | ORAL | Qty: 1

## 2020-08-22 MED FILL — HUMULIN N NPH U-100 INSULIN (ISOPHANE SUSP) 100 UNIT/ML SUBCUTANEOUS: 100 unit/mL | SUBCUTANEOUS | Qty: 17

## 2020-08-22 MED FILL — POTASSIUM CHLORIDE SR 10 MEQ TAB: 10 mEq | ORAL | Qty: 4

## 2020-08-22 MED FILL — NS WITH POTASSIUM CHLORIDE 40 MEQ/L IV: 40 mEq/L | INTRAVENOUS | Qty: 1000

## 2020-08-22 MED FILL — PIPERACILLIN-TAZOBACTAM 3.375 GRAM IV SOLR: 3.375 gram | INTRAVENOUS | Qty: 3.38

## 2020-08-22 MED FILL — MEROPENEM 1 GRAM IV SOLR: 1 gram | INTRAVENOUS | Qty: 1

## 2020-08-22 MED FILL — LISINOPRIL 40 MG TAB: 40 mg | ORAL | Qty: 1

## 2020-08-22 MED FILL — METOPROLOL TARTRATE 25 MG TAB: 25 mg | ORAL | Qty: 1

## 2020-08-22 NOTE — Progress Notes (Signed)
DC Plan: Home    CM will continue to follow.

## 2020-08-22 NOTE — Progress Notes (Signed)
Hospitalist Progress Note    Subjective:   Daily Progress Note: 08/22/2020 7:59 AM    Patient tolerating clear liquids without nausea or vomiting but remains with abdominal pain. Patient denies lightheadedness, headache, tremors, chest pain, or shortness of breath. No overnight events reported. No acute distress noted on examination.    Current Facility-Administered Medications   Medication Dose Route Frequency   ??? chlordiazePOXIDE (LIBRIUM) capsule 5 mg  5 mg Oral TID   ??? potassium chloride SR (KLOR-CON 10) tablet 40 mEq  40 mEq Oral DAILY   ??? prochlorperazine (COMPAZINE) injection 5 mg  5 mg IntraVENous Q3H PRN   ??? ketorolac (TORADOL) injection 30 mg  30 mg IntraVENous Q6H PRN   ??? baclofen (LIORESAL) tablet 5 mg  5 mg Oral TID   ??? famotidine (PF) (PEPCID) 20 mg in 0.9% sodium chloride 10 mL injection  20 mg IntraVENous Q12H   ??? hydrALAZINE (APRESOLINE) 20 mg/mL injection 10 mg  10 mg IntraVENous QID PRN   ??? sodium chloride (NS) flush 5-40 mL  5-40 mL IntraVENous Q8H   ??? sodium chloride (NS) flush 5-40 mL  5-40 mL IntraVENous PRN   ??? acetaminophen (TYLENOL) tablet 650 mg  650 mg Oral Q6H PRN    Or   ??? acetaminophen (TYLENOL) suppository 650 mg  650 mg Rectal Q6H PRN   ??? polyethylene glycol (MIRALAX) packet 17 g  17 g Oral DAILY PRN   ??? ondansetron (ZOFRAN ODT) tablet 4 mg  4 mg Oral Q8H PRN    Or   ??? ondansetron (ZOFRAN) injection 4 mg  4 mg IntraVENous Q6H PRN   ??? enoxaparin (LOVENOX) injection 40 mg  40 mg SubCUTAneous DAILY   ??? 0.9% sodium chloride infusion  125 mL/hr IntraVENous CONTINUOUS   ??? piperacillin-tazobactam (ZOSYN) 3.375 g in 0.9% sodium chloride (MBP/ADV) 100 mL MBP  3.375 g IntraVENous Q8H   ??? lisinopriL (PRINIVIL, ZESTRIL) tablet 40 mg  40 mg Oral DAILY   ??? metoprolol tartrate (LOPRESSOR) tablet 25 mg  25 mg Oral BID   ??? LORazepam (ATIVAN) injection 1 mg  1 mg IntraVENous Q2H PRN   ??? glucose chewable tablet 16 g  4 Tablet Oral PRN   ??? dextrose 10% infusion 0-250 mL  0-250 mL IntraVENous PRN   ???  glucagon (GLUCAGEN) injection 1 mg  1 mg IntraMUSCular PRN   ??? insulin NPH (NOVOLIN N, HUMULIN N) injection 17 Units  0.2 Units/kg SubCUTAneous ACB&D   ??? insulin lispro (HUMALOG) injection   SubCUTAneous AC&HS        Review of Systems  Review of Systems   Constitutional: Negative.    Respiratory: Negative.    Cardiovascular: Negative.    Gastrointestinal: Positive for abdominal pain. Negative for nausea and vomiting.   Neurological: Negative for dizziness, tremors and headaches.   All other systems reviewed and are negative.       Objective:     Visit Vitals  BP (!) 171/105   Pulse (!) 101   Temp 98.6 ??F (37 ??C)   Resp 18   Ht '5\' 9"'  (1.753 m)   Wt 87.1 kg (192 lb)   SpO2 99%   BMI 28.35 kg/m??      O2 Device: None (Room air)    Temp (24hrs), Avg:98.2 ??F (36.8 ??C), Min:98 ??F (36.7 ??C), Max:98.6 ??F (37 ??C)      No intake/output data recorded.  06/25 1901 - 06/27 0700  In: 1700 [I.V.:1700]  Out: 650 [Urine:650]  Recent Results (from the past 24 hour(s))   METABOLIC PANEL, COMPREHENSIVE    Collection Time: 08/21/20 12:37 PM   Result Value Ref Range    Sodium 143 136 - 145 mmol/L    Potassium 3.4 (L) 3.5 - 5.1 mmol/L    Chloride 111 (H) 97 - 108 mmol/L    CO2 26 21 - 32 mmol/L    Anion gap 6 5 - 15 mmol/L    Glucose 200 (H) 65 - 100 mg/dL    BUN 7 6 - 20 mg/dL    Creatinine 1.02 0.70 - 1.30 mg/dL    BUN/Creatinine ratio 7 (L) 12 - 20      GFR est AA >60 >60 ml/min/1.75m    GFR est non-AA >60 >60 ml/min/1.738m   Calcium 7.8 (L) 8.5 - 10.1 mg/dL    Bilirubin, total 0.5 0.2 - 1.0 mg/dL    AST (SGOT) 39 (H) 15 - 37 U/L    ALT (SGPT) 60 12 - 78 U/L    Alk. phosphatase 180 (H) 45 - 117 U/L    Protein, total 5.6 (L) 6.4 - 8.2 g/dL    Albumin 2.2 (L) 3.5 - 5.0 g/dL    Globulin 3.4 2.0 - 4.0 g/dL    A-G Ratio 0.6 (L) 1.1 - 2.2     CBC WITH AUTOMATED DIFF    Collection Time: 08/21/20 12:37 PM   Result Value Ref Range    WBC 5.5 4.1 - 11.1 K/uL    RBC 4.16 4.10 - 5.70 M/uL    HGB 11.7 (L) 12.1 - 17.0 g/dL    HCT 35.4 (L) 36.6 -  50.3 %    MCV 85.1 80.0 - 99.0 FL    MCH 28.1 26.0 - 34.0 PG    MCHC 33.1 30.0 - 36.5 g/dL    RDW 15.3 (H) 11.5 - 14.5 %    PLATELET 189 150 - 400 K/uL    MPV 10.9 8.9 - 12.9 FL    NRBC 0.0 0.0 PER 100 WBC    ABSOLUTE NRBC 0.00 0.00 - 0.01 K/uL    NEUTROPHILS 68 32 - 75 %    LYMPHOCYTES 21 12 - 49 %    MONOCYTES 10 5 - 13 %    EOSINOPHILS 1 0 - 7 %    BASOPHILS 0 0 - 1 %    IMMATURE GRANULOCYTES 0 0 - 0.5 %    ABS. NEUTROPHILS 3.7 1.8 - 8.0 K/UL    ABS. LYMPHOCYTES 1.2 0.8 - 3.5 K/UL    ABS. MONOCYTES 0.6 0.0 - 1.0 K/UL    ABS. EOSINOPHILS 0.0 0.0 - 0.4 K/UL    ABS. BASOPHILS 0.0 0.0 - 0.1 K/UL    ABS. IMM. GRANS. 0.0 0.00 - 0.04 K/UL    DF AUTOMATED     LIPASE    Collection Time: 08/21/20 12:37 PM   Result Value Ref Range    Lipase 2,320 (H) 73 - 393 U/L   GLUCOSE, POC    Collection Time: 08/21/20 12:49 PM   Result Value Ref Range    Glucose (POC) 153 (H) 65 - 117 mg/dL    Performed by ZoEverette Melendez  GLUCOSE, POC    Collection Time: 08/21/20  6:33 PM   Result Value Ref Range    Glucose (POC) 153 (H) 65 - 117 mg/dL    Performed by ZoEverette Melendez  GLUCOSE, POC    Collection Time: 08/21/20  8:32 PM   Result  Value Ref Range    Glucose (POC) 155 (H) 65 - 117 mg/dL    Performed by Cathlean Marseilles         [place] ABD LTD   Final Result      CT ABD PELV W CONT   Final Result   Moderate acute pancreatitis with edema of the adjacent gastric wall           PHYSICAL EXAM:    Physical Exam  Vitals and nursing note reviewed.   Constitutional:       Appearance: He is obese.   HENT:      Head: Normocephalic and atraumatic.   Eyes:      General: Scleral icterus present.   Cardiovascular:      Rate and Rhythm: Normal rate and regular rhythm.   Pulmonary:      Effort: No respiratory distress.      Breath sounds: No wheezing.   Abdominal:      General: Bowel sounds are normal. There is no distension.      Palpations: Abdomen is soft.      Tenderness: There is abdominal tenderness.      Comments: Negative for cullen or turners sign     Genitourinary:     Comments: No foley present  Musculoskeletal:      Right lower leg: No edema.      Left lower leg: No edema.   Skin:     General: Skin is warm.   Neurological:      Mental Status: He is alert and oriented to person, place, and time.   Psychiatric:         Mood and Affect: Mood normal.          Data Review    Recent Results (from the past 24 hour(s))   METABOLIC PANEL, COMPREHENSIVE    Collection Time: 08/21/20 12:37 PM   Result Value Ref Range    Sodium 143 136 - 145 mmol/L    Potassium 3.4 (L) 3.5 - 5.1 mmol/L    Chloride 111 (H) 97 - 108 mmol/L    CO2 26 21 - 32 mmol/L    Anion gap 6 5 - 15 mmol/L    Glucose 200 (H) 65 - 100 mg/dL    BUN 7 6 - 20 mg/dL    Creatinine 1.02 0.70 - 1.30 mg/dL    BUN/Creatinine ratio 7 (L) 12 - 20      GFR est AA >60 >60 ml/min/1.58m    GFR est non-AA >60 >60 ml/min/1.733m   Calcium 7.8 (L) 8.5 - 10.1 mg/dL    Bilirubin, total 0.5 0.2 - 1.0 mg/dL    AST (SGOT) 39 (H) 15 - 37 U/L    ALT (SGPT) 60 12 - 78 U/L    Alk. phosphatase 180 (H) 45 - 117 U/L    Protein, total 5.6 (L) 6.4 - 8.2 g/dL    Albumin 2.2 (L) 3.5 - 5.0 g/dL    Globulin 3.4 2.0 - 4.0 g/dL    A-G Ratio 0.6 (L) 1.1 - 2.2     CBC WITH AUTOMATED DIFF    Collection Time: 08/21/20 12:37 PM   Result Value Ref Range    WBC 5.5 4.1 - 11.1 K/uL    RBC 4.16 4.10 - 5.70 M/uL    HGB 11.7 (L) 12.1 - 17.0 g/dL    HCT 35.4 (L) 36.6 - 50.3 %    MCV 85.1 80.0 - 99.0 FL    MCH  28.1 26.0 - 34.0 PG    MCHC 33.1 30.0 - 36.5 g/dL    RDW 15.3 (H) 11.5 - 14.5 %    PLATELET 189 150 - 400 K/uL    MPV 10.9 8.9 - 12.9 FL    NRBC 0.0 0.0 PER 100 WBC    ABSOLUTE NRBC 0.00 0.00 - 0.01 K/uL    NEUTROPHILS 68 32 - 75 %    LYMPHOCYTES 21 12 - 49 %    MONOCYTES 10 5 - 13 %    EOSINOPHILS 1 0 - 7 %    BASOPHILS 0 0 - 1 %    IMMATURE GRANULOCYTES 0 0 - 0.5 %    ABS. NEUTROPHILS 3.7 1.8 - 8.0 K/UL    ABS. LYMPHOCYTES 1.2 0.8 - 3.5 K/UL    ABS. MONOCYTES 0.6 0.0 - 1.0 K/UL    ABS. EOSINOPHILS 0.0 0.0 - 0.4 K/UL    ABS. BASOPHILS 0.0 0.0 -  0.1 K/UL    ABS. IMM. GRANS. 0.0 0.00 - 0.04 K/UL    DF AUTOMATED     LIPASE    Collection Time: 08/21/20 12:37 PM   Result Value Ref Range    Lipase 2,320 (H) 73 - 393 U/L   GLUCOSE, POC    Collection Time: 08/21/20 12:49 PM   Result Value Ref Range    Glucose (POC) 153 (H) 65 - 117 mg/dL    Performed by Antonio Melendez    GLUCOSE, POC    Collection Time: 08/21/20  6:33 PM   Result Value Ref Range    Glucose (POC) 153 (H) 65 - 117 mg/dL    Performed by Antonio Melendez    GLUCOSE, POC    Collection Time: 08/21/20  8:32 PM   Result Value Ref Range    Glucose (POC) 155 (H) 65 - 117 mg/dL    Performed by Cathlean Marseilles         Assessment/Plan:     Active Problems:    Pancreatitis (08/18/2020)      Acute pancreatitis (08/19/2020)      Acute cystitis with hematuria (08/20/2020)        Hospital Course:    Antonio SNIDER Sr. is a 60 y.o. male with a PMH of diabetes, hypertension, alcohol abuse, and hyperlipidemia who presented to the freestanding ED with abdominal pain. In FSED tachycardic and hypertensive. Initial labs significant for WBC of 12.5, sodium of 126, glucose of 273, bilirubin of 1.9, ALT of 241, AST of 347, alk phosphatase of 300, lipase of 1093, lactate of 4.6, and BNP of 541. Urinalysis with proteinuria, glucosuria, ketonuria, hematuria, pyuria and leuk esterase with bacteria. CT of the abdomen showed moderate acute pancreatitis with edema of the adjacent gastric wall. Patient admitted for further work-up. Patient started on IV Pepcid, fluids, Zosyn, and pain control. Blood culture pending. GI and general surgery consulted. General surgery deemed not a surgical candidate at this time. Patient WBC, CRP, and procalcitonin elevated. ID consulted. Will add vancomycin for empiric coverage. No growth to date with blood culture we will discontinue vancomycin. Urine culture grew E Coli. Labs pending today.    Pancreatitis  Lipase 1093>697>424>1045-->2320-->2021  Continue on IVF and pain control  Advance diet per GI: clear  liquid  GI and general surgery following  ??  Sepsis, POA with tachycardia, WBC elevated and lactic  Down-trending inflammatory markers, trend  Continue on zosyn and IVF  No growth to date with blood culture we will discontinue vancomycin  6/23  blood: NGTD, prelim  ID consulted  ??  Urinary tract infection  6/23 urine: E Coli  Continue on Zosyn   ??  Elevated transamidase  Hepatitis panel non-reactive  ??  Diabetes Mellitus, type II  A1c 11.6  Continue 17 units of NPH  SSI as needed  ??  Hypertension  BP currently elevated  Continue on metoprolol and lisinopril  ??  Hyperlipidemia  Hold pravastatin  ??  Alcohol use  Continue on librium, wean as tolerated  CIWA protocol in place    Hypokalemia  K 2.6  Replenish with PO and IV K  ??    DVT Prophylaxis: lovenox  GI Prophylaxis: pepcid  Discharge and disposition barriers:   clinical improvement  24 - 48 hours  AM labs pending   tolerating PO intake      Above treatment plan and diagnostic results discussed with patient in detail a t bedside, all questions answered.     Care Plan discussed with: Interdisciplinary team    Total time spent with patient: 35 minutes.

## 2020-08-22 NOTE — Consults (Signed)
Infectious Disease Consult Note    Reason for Consult: Acute pancreatitis, UTI   Date of Consultation: August 22, 2020  Date of Admission: 08/18/2020  Referring Physician: Hospitalist     HPI: 60 y.o BM admitted on 06/23 w alcohol associate pancreatitis, ID consulted due to concerns of sepsis. His medical history is sig for DM, HTN, hyperlipidemia, and alcoholism. Pt reported abdominal pain on associated w N/V/D on presentation. CT abdo/pel on 06/23 revealed moderate acute pancreatitis with edema of the adjacent gastric wall. Initial labs showed a WBC of 12.5 which has normalized on todays labs. Lipase is still markedly elevated despite conservative mgt of pancreatitis, including clear liquid diet and antibiotics. He reported abdominal pain during my assessment, worse today than on presentation.  UA on 06/23 was abnormal, E.coli isolated from Cx. He is on Zosyn monotherapy     Review of Systems:     Gen: Negative for chills, fevers, weight loss, weight gain   CV:  Negative for chest pain, dyspnea on exertion, leg edema   Lungs: Negative for shortness of breath, cough, wheezing   Abdomen: Abdominal pain, denies nausea/vomiting    Genitourinary: Negative for genital pain or genital discharge     Skin: Negative for rash, sores/open wounds   Musculoskeletal: Negative for joint pain, joint swelling, joint erythema    Psych: Negative for manic behavior     Past Medical History:  Past Medical History:   Diagnosis Date   ??? Diabetes ([place])    ??? High cholesterol    ??? Hypertension        Past surgical history   Past Surgical History:   Procedure Laterality Date   ??? HX CHOLECYSTECTOMY          Social History   Social History     Tobacco Use   ??? Smoking status: Never Smoker   ??? Smokeless tobacco: Never Used   Vaping Use   ??? Vaping Use: Never used   Substance Use Topics   ??? Alcohol use: Yes     Alcohol/week: 74.0 standard drinks     Types: 74 Cans of beer per week   ??? Drug use: Never        Family history   Family History   Problem  Relation Age of Onset   ??? Hypertension Mother           Allergies:  No Known Allergies      Medications:  No current facility-administered medications on file prior to encounter.     Current Outpatient Medications on File Prior to Encounter   Medication Sig Dispense Refill   ??? metFORMIN ER (GLUCOPHAGE XR) 500 mg tablet Take 500 mg by mouth daily.     ??? metoprolol succinate (TOPROL-XL) 25 mg XL tablet Take 25 mg by mouth daily.     ??? omeprazole (PRILOSEC) 40 mg capsule Take 40 mg by mouth daily.     ??? ramipriL (ALTACE) 10 mg capsule Take 10 mg by mouth daily.     ??? metoprolol tartrate (LOPRESSOR) 25 mg tablet Take 25 mg by mouth two (2) times a day.     ??? rosuvastatin (Crestor) 40 mg tablet Take 40 mg by mouth nightly.     ??? gabapentin (NEURONTIN) 600 mg tablet Take 600 mg by mouth At bedtime.     ??? DULoxetine (CYMBALTA) 60 mg capsule Take  by mouth.     ??? insulin nph-regular human rec (NovoLIN 70-30 FlexPen U-100) 100 unit/mL (70-30) inpn Inject 48  units qAM, 28 units qhs 2 Each 0   ??? chlordiazePOXIDE (LIBRIUM) 10 mg capsule Take 1 Capsule by mouth three (3) times daily. Max Daily Amount: 30 mg. (Patient not taking: Reported on 08/18/2020) 21 Capsule 1   ??? sucralfate (CARAFATE) 1 gram tablet Take 1 Tablet by mouth Before breakfast, lunch, dinner and at bedtime. (Patient not taking: Reported on 08/18/2020) 21 Tablet 1   ??? SITagliptin (Januvia) 50 mg tablet Take 50 mg by mouth daily. (Patient not taking: Reported on 08/18/2020)     ??? insulin regular (NOVOLIN R, HUMULIN R) 100 unit/mL injection by SubCUTAneous route. On a scale insulin (Patient not taking: Reported on 08/18/2020)     ??? amLODIPine (NORVASC) 10 mg tablet Take 10 mg by mouth. (Patient not taking: Reported on 08/18/2020)         Physical Exam:    Vitals:   Patient Vitals for the past 24 hrs:   Temp Pulse Resp BP SpO2   08/22/20 0716 98.6 ??F (37 ??C) (!) 101 18 (!) 171/105 99 %   08/21/20 2041 98 ??F (36.7 ??C) 91 16 (!) 158/93 97 %   08/21/20 2023 98 ??F (36.7 ??C)  91 16 (!) 162/110 97 %   08/21/20 1600 -- 98 -- -- --   ??   ?? GEN: NAD some distress from abdominal pain   ?? HEENT: NCAT, PERRLA  ?? HEART: S1, S2+, RRR, No murmur   ?? Lungs: CTA B/l, decreased at the bases, no wheeze/rhonchi    ?? Abdomen: soft, mld tenderness on palpation or LUQ   ?? Genitourinary: no genital discharge, no foley  ?? Extremities: no edema  ?? Skin: no rash    Labs:   Recent Labs     08/22/20  0953 08/21/20  1237 08/20/20  1647   WBC 6.7 5.5 8.4   HGB 11.0* 11.7* 12.5   HCT 33.2* 35.4* 39.3   PLT 226 189 167     Recent Labs     08/22/20  0953 08/21/20  1237 08/20/20  0910   BUN 5* 7 14   CREA 1.08 1.02 1.20       Lab Results   Component Value Date/Time    C-Reactive protein 24.60 (H) 08/20/2020 09:10 AM      No results found for: SR     Microbiology Data:  - Blood Cx 06/23: Neg   - Urine Cx 06/23: E.coli     Imaging:   CT abdo/pel 06/23: Moderate acute pancreatitis with edema of the adjacent gastric wall    Assessment / Plan:     1. Acute severe pancreatitis, alcohol associated, prior cholecystectomy       Moderate acute pancreatitis with edema of the adjacent      Ongoing abdominal pain, lipase still markedly elevated. Afebrile, WBC normalized.       Repeat CT abdo/pel w contrast to eval for pancreatic necrosis or abscess      Broaden antimicrobial coverage w Meropenem, d/c Zosyn       Routine labs in the morning including lipase, CRP, Procal and ESR     2. UTI, Pansenstive E.coli isolated from Cx, should be covered by Meropenem     3. Alcoholism, due to depression per pt     4. Other chronic problems per HPI     Orson Eva, MD     08/22/2020

## 2020-08-22 NOTE — Progress Notes (Signed)
Progress Note    Patient: Antonio SCHREIER Sr. MRN: [id number]  SSN: [id number]    Date of Birth: [birth year]  Age: 60 y.o.  Sex: male      Admit Date: 08/18/2020    LOS: 3 days     Subjective:   GI in consultation for Pancreatitis.    6/27: Patient continues to complain of abdominal pain. Lipase 2,021. He is on clear liquid diet. He reports the pain medication is working.    History of Present Illness:??Antonio Bast Sr.??is a 60 y.o.??male??who is seen in consultation for Pancreatitis. Antonio Melendez presented to the ED with complaints of abdominal pain, nausea, and vomiting. He reports his abdominal pain is on and off. He reports he has been drinking about a 12 pack of beer or bottles of alcohol nearly every day for the past 3 weeks. He denies fever, hematemesis, or hemoptysis. He denies tobacco use.His last alcoholic drink was on Tuesday. He does have abdominal tenderness with light palpation. He reports frequent hiccups. He has a past medical history significant for hypertension, type 2 diabetes, insulin dependent, and hyperlipidemia. His lipase on admission was over 1,000 currently it is 424. He is NPO with IV hydration. Potassium low at 2.9 this morning, he was given IV potassium. Total bilirubin 1.6, ALT 124, AST 111, Alk Phosphatase 200. CT abdomen shows moderate acute pancreatitis with edema of the adjacent gastric wall and borderline fatty liver. His acute hepatitis panel is negative.   ??      Objective:     Vitals:    08/21/20 1600 08/21/20 2023 08/21/20 2041 08/22/20 0716   BP:  (!) 162/110 (!) 158/93 (!) 171/105   Pulse: 98 91 91 (!) 101   Resp:  '16 16 18   ' Temp:  98 ??F (36.7 ??C) 98 ??F (36.7 ??C) 98.6 ??F (37 ??C)   SpO2:  97% 97% 99%   Weight:       Height:            Intake and Output:  Current Shift: No intake/output data recorded.  Last three shifts: 06/25 1901 - 06/27 0700  In: 1700 [I.V.:1700]  Out: 650 [Urine:650]    Physical Exam:   Skin:  Extremities and face reveal no rashes. No palmer erythema.    HEENT: Sclerae anicteric. Extra-occular muscles are intact. No abnormal pigmentation of the lips. The neck is supple.  Cardiovascular: Regular rate and rhythm.  Respiratory:  Comfortable breathing with no accessory muscle use.   GI:  Abdomen nondistended, soft, and nontender. No enlargement of the liver or spleen. No masses palpable.  Rectal:  Deferred  Musculoskeletal: Extremities have good range of motion.  Neurological:  Gross memory appears intact.  Patient is alert and oriented.  Psychiatric:  Mood appears appropriate with judgement intact.  Lymphatic:  No visible adenopathy      Lab/Data Review:  Recent Results (from the past 24 hour(s))   GLUCOSE, POC    Collection Time: 08/21/20  6:33 PM   Result Value Ref Range    Glucose (POC) 153 (H) 65 - 117 mg/dL    Performed by Everette Rank    GLUCOSE, POC    Collection Time: 08/21/20  8:32 PM   Result Value Ref Range    Glucose (POC) 155 (H) 65 - 117 mg/dL    Performed by [facility] [place], COMPREHENSIVE    Collection Time: 08/22/20  9:53 AM   Result Value Ref Range  Sodium 135 (L) 136 - 145 mmol/L    Potassium 2.6 (LL) 3.5 - 5.1 mmol/L    Chloride 103 97 - 108 mmol/L    CO2 24 21 - 32 mmol/L    Anion gap 8 5 - 15 mmol/L    Glucose 148 (H) 65 - 100 mg/dL    BUN 5 (L) 6 - 20 mg/dL    Creatinine 1.08 0.70 - 1.30 mg/dL    BUN/Creatinine ratio 5 (L) 12 - 20      GFR est AA >60 >60 ml/min/1.11m    GFR est non-AA >60 >60 ml/min/1.774m   Calcium 8.3 (L) 8.5 - 10.1 mg/dL    Bilirubin, total 0.7 0.2 - 1.0 mg/dL    AST (SGOT) 39 (H) 15 - 37 U/L    ALT (SGPT) 60 12 - 78 U/L    Alk. phosphatase 178 (H) 45 - 117 U/L    Protein, total 7.0 6.4 - 8.2 g/dL    Albumin 2.4 (L) 3.5 - 5.0 g/dL    Globulin 4.6 (H) 2.0 - 4.0 g/dL    A-G Ratio 0.5 (L) 1.1 - 2.2     CBC WITH AUTOMATED DIFF    Collection Time: 08/22/20  9:53 AM   Result Value Ref Range    WBC 6.7 4.1 - 11.1 K/uL    RBC 3.96 (L) 4.10 - 5.70 M/uL    HGB 11.0 (L) 12.1 - 17.0 g/dL    HCT 33.2 (L) 36.6 - 50.3 %     MCV 83.8 80.0 - 99.0 FL    MCH 27.8 26.0 - 34.0 PG    MCHC 33.1 30.0 - 36.5 g/dL    RDW 14.7 (H) 11.5 - 14.5 %    PLATELET 226 150 - 400 K/uL    MPV 11.2 8.9 - 12.9 FL    NRBC 0.0 0.0 PER 100 WBC    ABSOLUTE NRBC 0.00 0.00 - 0.01 K/uL    NEUTROPHILS 69 32 - 75 %    LYMPHOCYTES 20 12 - 49 %    MONOCYTES 11 5 - 13 %    EOSINOPHILS 0 0 - 7 %    BASOPHILS 0 0 - 1 %    IMMATURE GRANULOCYTES 0 0 - 0.5 %    ABS. NEUTROPHILS 4.6 1.8 - 8.0 K/UL    ABS. LYMPHOCYTES 1.3 0.8 - 3.5 K/UL    ABS. MONOCYTES 0.8 0.0 - 1.0 K/UL    ABS. EOSINOPHILS 0.0 0.0 - 0.4 K/UL    ABS. BASOPHILS 0.0 0.0 - 0.1 K/UL    ABS. IMM. GRANS. 0.0 0.00 - 0.04 K/UL    DF AUTOMATED     LIPASE    Collection Time: 08/22/20  9:53 AM   Result Value Ref Range    Lipase 2,021 (H) 73 - 393 U/L   GLUCOSE, POC    Collection Time: 08/22/20 12:20 PM   Result Value Ref Range    Glucose (POC) 89 65 - 117 mg/dL    Performed by ReEarline [place]             [email or web address]   Final Result      CT ABD PELV W CONT   Final Result   Moderate acute pancreatitis with edema of the adjacent gastric wall          Assessment:     Principal Problem:    Pancreatitis (08/18/2020)    Active Problems:    Acute pancreatitis (08/19/2020)  Acute cystitis with hematuria (08/20/2020)        Plan:   1. Pancreatitis  ??????clear Liquid diet  ??????Continue IV hydration  ??????Lipase in the am  ??????CT abdomen 6/23: Moderate acute pancreatitis with edema of the adjacent gastric wall  ??2. Transaminitis??(trending down)  ??????????Hx of ETOH abuse  ??????????CT abdomen: Borderline fatty liver  ??????????Negative Acute Hepatitis Panel  ??????????Continue CIWA protocol  3. Frequent Hiccups  ????????Baclofen 5 mg TID  4. Type 2 Diabetes  ??????Continue Home medications  ??????Continue bedside glucose monitoring    Thank you for allowing me to participate in this patients care.   Plan discussed with Dr. Karel Jarvis and he approves.    Signed By: Terrace Arabia, NP     August 22, 2020

## 2020-08-22 NOTE — Progress Notes (Signed)
Problem: Diabetes Self-Management  Goal: *Disease process and treatment process  Description: Define diabetes and identify own type of diabetes; list 3 options for treating diabetes.  Outcome: Progressing Towards Goal  Goal: *Incorporating nutritional management into lifestyle  Description: Describe effect of type, amount and timing of food on blood glucose; list 3 methods for planning meals.  Outcome: Progressing Towards Goal  Goal: *Monitoring blood glucose, interpreting and using results  Description: Identify recommended blood glucose targets  and personal targets.  Outcome: Progressing Towards Goal  Goal: *Prevention, detection, treatment of acute complications  Description: List symptoms of hyper- and hypoglycemia; describe how to treat low blood sugar and actions for lowering  high blood glucose level.  Outcome: Progressing Towards Goal

## 2020-08-23 LAB — METABOLIC PANEL, COMPREHENSIVE
A-G Ratio: 0.6 — ABNORMAL LOW (ref 1.1–2.2)
ALT (SGPT): 52 U/L (ref 12–78)
AST (SGOT): 27 U/L (ref 15–37)
Albumin: 2.7 g/dL — ABNORMAL LOW (ref 3.5–5.0)
Alk. phosphatase: 163 U/L — ABNORMAL HIGH (ref 45–117)
Anion gap: 10 mmol/L (ref 5–15)
BUN/Creatinine ratio: 2 — ABNORMAL LOW (ref 12–20)
BUN: 2 mg/dL — ABNORMAL LOW (ref 6–20)
Bilirubin, total: 0.6 mg/dL (ref 0.2–1.0)
CO2: 24 mmol/L (ref 21–32)
Calcium: 8.6 mg/dL (ref 8.5–10.1)
Chloride: 102 mmol/L (ref 97–108)
Creatinine: 1.06 mg/dL (ref 0.70–1.30)
GFR est AA: >60 mL/min/{1.73_m2} (ref >60)
GFR est non-AA: >60 mL/min/{1.73_m2} (ref >60)
Globulin: 4.6 g/dL — ABNORMAL HIGH (ref 2.0–4.0)
Glucose: 187 mg/dL — ABNORMAL HIGH (ref 65–100)
Potassium: 2.8 mmol/L — ABNORMAL LOW (ref 3.5–5.1)
Protein, total: 7.3 g/dL (ref 6.4–8.2)
Sodium: 136 mmol/L (ref 136–145)

## 2020-08-23 LAB — GLUCOSE, POC
Glucose (POC): 166 mg/dL — ABNORMAL HIGH (ref 65–117)
Glucose (POC): 168 mg/dL — ABNORMAL HIGH (ref 65–117)
Glucose (POC): 185 mg/dL — ABNORMAL HIGH (ref 65–117)
Glucose (POC): 188 mg/dL — ABNORMAL HIGH (ref 65–117)
Glucose (POC): 199 mg/dL — ABNORMAL HIGH (ref 65–117)

## 2020-08-23 LAB — CBC WITH AUTOMATED DIFF
ABS. BASOPHILS: 0 10*3/uL (ref 0.0–0.1)
ABS. EOSINOPHILS: 0 10*3/uL (ref 0.0–0.4)
ABS. IMM. GRANS.: 0 10*3/uL (ref 0.00–0.04)
ABS. LYMPHOCYTES: 1.9 10*3/uL (ref 0.8–3.5)
ABS. MONOCYTES: 0.9 10*3/uL (ref 0.0–1.0)
ABS. NEUTROPHILS: 3.5 10*3/uL (ref 1.8–8.0)
ABSOLUTE NRBC: 0 10*3/uL (ref 0.00–0.01)
BASOPHILS: 0 % (ref 0–1)
EOSINOPHILS: 1 % (ref 0–7)
HCT: 34.4 % — ABNORMAL LOW (ref 36.6–50.3)
HGB: 11.4 g/dL — ABNORMAL LOW (ref 12.1–17.0)
IMMATURE GRANULOCYTES: 1 % — ABNORMAL HIGH (ref 0–0.5)
LYMPHOCYTES: 29 % (ref 12–49)
MCH: 28.1 PG (ref 26.0–34.0)
MCHC: 33.1 g/dL (ref 30.0–36.5)
MCV: 84.7 FL (ref 80.0–99.0)
MONOCYTES: 14 % — ABNORMAL HIGH (ref 5–13)
MPV: 11.3 FL (ref 8.9–12.9)
NEUTROPHILS: 55 % (ref 32–75)
NRBC: 0 PER 100 WBC
PLATELET: 268 10*3/uL (ref 150–400)
RBC: 4.06 M/uL — ABNORMAL LOW (ref 4.10–5.70)
RDW: 14.9 % — ABNORMAL HIGH (ref 11.5–14.5)
WBC: 6.3 10*3/uL (ref 4.1–11.1)

## 2020-08-23 LAB — MAGNESIUM
Magnesium: 1.4 mg/dL — ABNORMAL LOW (ref 1.6–2.4)
Magnesium: 1.4 mg/dL — ABNORMAL LOW (ref 1.6–2.4)

## 2020-08-23 LAB — PROCALCITONIN
Procalcitonin: 0.27 ng/mL — ABNORMAL HIGH
Procalcitonin: 0.27 ng/mL — ABNORMAL HIGH

## 2020-08-23 LAB — C REACTIVE PROTEIN, QT: C-Reactive protein: 7.38 mg/dL — ABNORMAL HIGH (ref 0.00–0.60)

## 2020-08-23 LAB — LIPASE
Lipase: 2664 U/L — ABNORMAL HIGH (ref 73–393)
Lipase: 2664 U/L — ABNORMAL HIGH (ref 73–393)

## 2020-08-23 LAB — POCT GLUCOSE
POC Glucose: 166 mg/dL — ABNORMAL HIGH (ref 65–117)
POC Glucose: 168 mg/dL — ABNORMAL HIGH (ref 65–117)
POC Glucose: 185 mg/dL — ABNORMAL HIGH (ref 65–117)
POC Glucose: 188 mg/dL — ABNORMAL HIGH (ref 65–117)
POC Glucose: 199 mg/dL — ABNORMAL HIGH (ref 65–117)

## 2020-08-23 LAB — CBC WITH AUTO DIFFERENTIAL
Basophils %: 0 % (ref 0–1)
Basophils Absolute: 0 10*3/uL (ref 0.0–0.1)
Eosinophils %: 1 % (ref 0–7)
Eosinophils Absolute: 0 10*3/uL (ref 0.0–0.4)
Granulocyte Absolute Count: 0 10*3/uL (ref 0.00–0.04)
Hematocrit: 34.4 % — ABNORMAL LOW (ref 36.6–50.3)
Hemoglobin: 11.4 g/dL — ABNORMAL LOW (ref 12.1–17.0)
Immature Granulocytes: 1 % — ABNORMAL HIGH (ref 0–0.5)
Lymphocytes %: 29 % (ref 12–49)
Lymphocytes Absolute: 1.9 10*3/uL (ref 0.8–3.5)
MCH: 28.1 PG (ref 26.0–34.0)
MCHC: 33.1 g/dL (ref 30.0–36.5)
MCV: 84.7 FL (ref 80.0–99.0)
MPV: 11.3 FL (ref 8.9–12.9)
Monocytes %: 14 % — ABNORMAL HIGH (ref 5–13)
Monocytes Absolute: 0.9 10*3/uL (ref 0.0–1.0)
NRBC Absolute: 0 10*3/uL (ref 0.00–0.01)
Neutrophils %: 55 % (ref 32–75)
Neutrophils Absolute: 3.5 10*3/uL (ref 1.8–8.0)
Nucleated RBCs: 0 PER 100 WBC
Platelets: 268 10*3/uL (ref 150–400)
RBC: 4.06 M/uL — ABNORMAL LOW (ref 4.10–5.70)
RDW: 14.9 % — ABNORMAL HIGH (ref 11.5–14.5)
WBC: 6.3 10*3/uL (ref 4.1–11.1)

## 2020-08-23 LAB — COMPREHENSIVE METABOLIC PANEL
ALT: 52 U/L (ref 12–78)
AST: 27 U/L (ref 15–37)
Albumin/Globulin Ratio: 0.6 — ABNORMAL LOW (ref 1.1–2.2)
Albumin: 2.7 g/dL — ABNORMAL LOW (ref 3.5–5.0)
Alkaline Phosphatase: 163 U/L — ABNORMAL HIGH (ref 45–117)
Anion Gap: 10 mmol/L (ref 5–15)
BUN: 2 mg/dL — ABNORMAL LOW (ref 6–20)
Bun/Cre Ratio: 2 — ABNORMAL LOW (ref 12–20)
CO2: 24 mmol/L (ref 21–32)
Calcium: 8.6 mg/dL (ref 8.5–10.1)
Chloride: 102 mmol/L (ref 97–108)
Creatinine: 1.06 mg/dL (ref 0.70–1.30)
EGFR IF NonAfrican American: >60 mL/min/{1.73_m2} (ref >60)
GFR African American: >60 mL/min/{1.73_m2} (ref >60)
Globulin: 4.6 g/dL — ABNORMAL HIGH (ref 2.0–4.0)
Glucose: 187 mg/dL — ABNORMAL HIGH (ref 65–100)
Potassium: 2.8 mmol/L — ABNORMAL LOW (ref 3.5–5.1)
Sodium: 136 mmol/L (ref 136–145)
Total Bilirubin: 0.6 mg/dL (ref 0.2–1.0)
Total Protein: 7.3 g/dL (ref 6.4–8.2)

## 2020-08-23 LAB — C-REACTIVE PROTEIN: CRP: 7.38 mg/dL — ABNORMAL HIGH (ref 0.00–0.60)

## 2020-08-23 MED ORDER — DIATRIZOATE MEGLUMINE & SODIUM 66 %-10 % ORAL SOLN
66-10 % | Freq: Once | ORAL | Status: AC
Start: 2020-08-23 — End: 2020-08-22
  Administered 2020-08-23: 03:00:00 via ORAL

## 2020-08-23 MED ORDER — POTASSIUM CHLORIDE 10 MEQ/100 ML IV PIGGY BACK
10 mEq/0 mL | INTRAVENOUS | Status: AC
Start: 2020-08-23 — End: 2020-08-23

## 2020-08-23 MED ORDER — POTASSIUM CHLORIDE SR 10 MEQ TAB
10 mEq | Freq: Two times a day (BID) | ORAL | Status: DC
Start: 2020-08-23 — End: 2020-08-25
  Administered 2020-08-24 – 2020-08-25 (×4): via ORAL

## 2020-08-23 MED ORDER — IOPAMIDOL 76 % IV SOLN
370 mg iodine /mL (76 %) | Freq: Once | INTRAVENOUS | Status: AC
Start: 2020-08-23 — End: 2020-08-23
  Administered 2020-08-23: 04:00:00 via INTRAVENOUS

## 2020-08-23 MED ORDER — POTASSIUM CHLORIDE 10 MEQ/100 ML IV PIGGY BACK
10 mEq/0 mL | INTRAVENOUS | Status: AC
Start: 2020-08-23 — End: 2020-08-23
  Administered 2020-08-24: via INTRAVENOUS

## 2020-08-23 MED ORDER — MORPHINE 2 MG/ML INJECTION
2 mg/mL | INTRAMUSCULAR | Status: DC | PRN
Start: 2020-08-23 — End: 2020-08-26
  Administered 2020-08-23 – 2020-08-24 (×5): via INTRAVENOUS

## 2020-08-23 MED FILL — CHLORDIAZEPOXIDE 5 MG CAP: 5 mg | ORAL | Qty: 1

## 2020-08-23 MED FILL — MEROPENEM 1 GRAM IV SOLR: 1 gram | INTRAVENOUS | Qty: 1

## 2020-08-23 MED FILL — ENOXAPARIN 40 MG/0.4 ML SUB-Q SYRINGE: 40 mg/0.4 mL | SUBCUTANEOUS | Qty: 0.4

## 2020-08-23 MED FILL — FAMOTIDINE (PF) 20 MG/2 ML IV: 20 mg/2 mL | INTRAVENOUS | Qty: 2

## 2020-08-23 MED FILL — NS WITH POTASSIUM CHLORIDE 40 MEQ/L IV: 40 mEq/L | INTRAVENOUS | Qty: 1000

## 2020-08-23 MED FILL — LISINOPRIL 40 MG TAB: 40 mg | ORAL | Qty: 1

## 2020-08-23 MED FILL — METOPROLOL TARTRATE 25 MG TAB: 25 mg | ORAL | Qty: 1

## 2020-08-23 MED FILL — PROCHLORPERAZINE EDISYLATE 5 MG/ML INJECTION: 5 mg/mL | INTRAMUSCULAR | Qty: 2

## 2020-08-23 MED FILL — POTASSIUM CHLORIDE SR 10 MEQ TAB: 10 mEq | ORAL | Qty: 4

## 2020-08-23 MED FILL — MORPHINE 2 MG/ML INJECTION: 2 mg/mL | INTRAMUSCULAR | Qty: 1

## 2020-08-23 MED FILL — HUMALOG U-100 INSULIN 100 UNIT/ML SUBCUTANEOUS SOLUTION: 100 unit/mL | SUBCUTANEOUS | Qty: 3

## 2020-08-23 MED FILL — KETOROLAC TROMETHAMINE 30 MG/ML INJECTION: 30 mg/mL (1 mL) | INTRAMUSCULAR | Qty: 1

## 2020-08-23 MED FILL — GASTROGRAFIN 66 %-10 % ORAL SOLUTION: 66-10 % | ORAL | Qty: 30

## 2020-08-23 MED FILL — LORAZEPAM 2 MG/ML IJ SOLN: 2 mg/mL | INTRAMUSCULAR | Qty: 1

## 2020-08-23 MED FILL — BACLOFEN 10 MG TAB: 10 mg | ORAL | Qty: 1

## 2020-08-23 MED FILL — ISOVUE-370  76 % INTRAVENOUS SOLUTION: 370 mg iodine /mL (76 %) | INTRAVENOUS | Qty: 100

## 2020-08-23 MED FILL — HYDRALAZINE 20 MG/ML IJ SOLN: 20 mg/mL | INTRAMUSCULAR | Qty: 1

## 2020-08-23 NOTE — Progress Notes (Signed)
Hospitalist Progress Note    Subjective:   Daily Progress Note: 08/23/2020 7:59 AM    Patient continue abdominal pain, worsen with PO intake.. Patient denies lightheadedness, headache, tremors, chest pain, or shortness of breath. No overnight events reported. No acute distress noted on examination.    Current Facility-Administered Medications   Medication Dose Route Frequency   ??? morphine injection 1 mg  1 mg IntraVENous Q4H PRN   ??? potassium chloride SR (KLOR-CON 10) tablet 40 mEq  40 mEq Oral BID   ??? potassium chloride 10 mEq in 100 ml IVPB  10 mEq IntraVENous Q1H   ??? 0.9% sodium chloride with KCl 40 mEq/L infusion   IntraVENous CONTINUOUS   ??? meropenem (MERREM) 1 g in 0.9% sodium chloride (MBP/ADV) 50 mL MBP  1 g IntraVENous Q8H   ??? chlordiazePOXIDE (LIBRIUM) capsule 5 mg  5 mg Oral TID   ??? prochlorperazine (COMPAZINE) injection 5 mg  5 mg IntraVENous Q3H PRN   ??? ketorolac (TORADOL) injection 30 mg  30 mg IntraVENous Q6H PRN   ??? baclofen (LIORESAL) tablet 5 mg  5 mg Oral TID   ??? famotidine (PF) (PEPCID) 20 mg in 0.9% sodium chloride 10 mL injection  20 mg IntraVENous Q12H   ??? hydrALAZINE (APRESOLINE) 20 mg/mL injection 10 mg  10 mg IntraVENous QID PRN   ??? sodium chloride (NS) flush 5-40 mL  5-40 mL IntraVENous Q8H   ??? sodium chloride (NS) flush 5-40 mL  5-40 mL IntraVENous PRN   ??? acetaminophen (TYLENOL) tablet 650 mg  650 mg Oral Q6H PRN    Or   ??? acetaminophen (TYLENOL) suppository 650 mg  650 mg Rectal Q6H PRN   ??? polyethylene glycol (MIRALAX) packet 17 g  17 g Oral DAILY PRN   ??? ondansetron (ZOFRAN ODT) tablet 4 mg  4 mg Oral Q8H PRN    Or   ??? ondansetron (ZOFRAN) injection 4 mg  4 mg IntraVENous Q6H PRN   ??? enoxaparin (LOVENOX) injection 40 mg  40 mg SubCUTAneous DAILY   ??? lisinopriL (PRINIVIL, ZESTRIL) tablet 40 mg  40 mg Oral DAILY   ??? metoprolol tartrate (LOPRESSOR) tablet 25 mg  25 mg Oral BID   ??? LORazepam (ATIVAN) injection 1 mg  1 mg IntraVENous Q2H PRN   ??? glucose chewable tablet 16 g  4 Tablet Oral  PRN   ??? dextrose 10% infusion 0-250 mL  0-250 mL IntraVENous PRN   ??? glucagon (GLUCAGEN) injection 1 mg  1 mg IntraMUSCular PRN   ??? insulin NPH (NOVOLIN N, HUMULIN N) injection 17 Units  0.2 Units/kg SubCUTAneous ACB&D   ??? insulin lispro (HUMALOG) injection   SubCUTAneous AC&HS        Review of Systems  Review of Systems   Constitutional: Negative.    Respiratory: Negative.    Cardiovascular: Negative.    Gastrointestinal: Positive for abdominal pain. Negative for nausea and vomiting.   Neurological: Negative for dizziness, tremors and headaches.   All other systems reviewed and are negative.       Objective:     Visit Vitals  BP (!) 151/89 Comment: 1hr after hydralazine   Pulse 80   Temp 98.2 ??F (36.8 ??C)   Resp 17   Ht '5\' 9"'  (1.753 m)   Wt 87.1 kg (192 lb)   SpO2 98%   BMI 28.35 kg/m??      O2 Device: None (Room air)    Temp (24hrs), Avg:98.6 ??F (37 ??C), Min:98.1 ??F (36.7 ??  C), Max:99.2 ??F (37.3 ??C)      No intake/output data recorded.  06/26 1901 - 06/28 0700  In: -   Out: 650 [Urine:650]    Recent Results (from the past 24 hour(s))   GLUCOSE, POC    Collection Time: 08/22/20  4:38 PM   Result Value Ref Range    Glucose (POC) 138 (H) 65 - 117 mg/dL    Performed by Earline Mayotte    GLUCOSE, POC    Collection Time: 08/22/20  8:24 PM   Result Value Ref Range    Glucose (POC) 199 (H) 65 - 117 mg/dL    Performed by [place], POC    Collection Time: 08/23/20  8:14 AM   Result Value Ref Range    Glucose (POC) 166 (H) 65 - 117 mg/dL    Performed by Lurene Shadow    METABOLIC PANEL, COMPREHENSIVE    Collection Time: 08/23/20  8:15 AM   Result Value Ref Range    Sodium 136 136 - 145 mmol/L    Potassium 2.8 (L) 3.5 - 5.1 mmol/L    Chloride 102 97 - 108 mmol/L    CO2 24 21 - 32 mmol/L    Anion gap 10 5 - 15 mmol/L    Glucose 187 (H) 65 - 100 mg/dL    BUN 2 (L) 6 - 20 mg/dL    Creatinine 1.06 0.70 - 1.30 mg/dL    BUN/Creatinine ratio 2 (L) 12 - 20      GFR est AA >60 >60 ml/min/1.46m    GFR est non-AA >60 >60  ml/min/1.715m   Calcium 8.6 8.5 - 10.1 mg/dL    Bilirubin, total 0.6 0.2 - 1.0 mg/dL    AST (SGOT) 27 15 - 37 U/L    ALT (SGPT) 52 12 - 78 U/L    Alk. phosphatase 163 (H) 45 - 117 U/L    Protein, total 7.3 6.4 - 8.2 g/dL    Albumin 2.7 (L) 3.5 - 5.0 g/dL    Globulin 4.6 (H) 2.0 - 4.0 g/dL    A-G Ratio 0.6 (L) 1.1 - 2.2     CBC WITH AUTOMATED DIFF    Collection Time: 08/23/20  8:15 AM   Result Value Ref Range    WBC 6.3 4.1 - 11.1 K/uL    RBC 4.06 (L) 4.10 - 5.70 M/uL    HGB 11.4 (L) 12.1 - 17.0 g/dL    HCT 34.4 (L) 36.6 - 50.3 %    MCV 84.7 80.0 - 99.0 FL    MCH 28.1 26.0 - 34.0 PG    MCHC 33.1 30.0 - 36.5 g/dL    RDW 14.9 (H) 11.5 - 14.5 %    PLATELET 268 150 - 400 K/uL    MPV 11.3 8.9 - 12.9 FL    NRBC 0.0 0.0 PER 100 WBC    ABSOLUTE NRBC 0.00 0.00 - 0.01 K/uL    NEUTROPHILS 55 32 - 75 %    LYMPHOCYTES 29 12 - 49 %    MONOCYTES 14 (H) 5 - 13 %    EOSINOPHILS 1 0 - 7 %    BASOPHILS 0 0 - 1 %    IMMATURE GRANULOCYTES 1 (H) 0 - 0.5 %    ABS. NEUTROPHILS 3.5 1.8 - 8.0 K/UL    ABS. LYMPHOCYTES 1.9 0.8 - 3.5 K/UL    ABS. MONOCYTES 0.9 0.0 - 1.0 K/UL    ABS. EOSINOPHILS 0.0 0.0 -  0.4 K/UL    ABS. BASOPHILS 0.0 0.0 - 0.1 K/UL    ABS. IMM. GRANS. 0.0 0.00 - 0.04 K/UL    DF AUTOMATED     LIPASE    Collection Time: 08/23/20  8:15 AM   Result Value Ref Range    Lipase 2,664 (H) 73 - 393 U/L   C REACTIVE PROTEIN, QT    Collection Time: 08/23/20  8:15 AM   Result Value Ref Range    C-Reactive protein 7.38 (H) 0.00 - 0.60 mg/dL   PROCALCITONIN    Collection Time: 08/23/20  8:15 AM   Result Value Ref Range    Procalcitonin 0.27 (H) 0 ng/mL   GLUCOSE, POC    Collection Time: 08/23/20 11:37 AM   Result Value Ref Range    Glucose (POC) 188 (H) 65 - 117 mg/dL    Performed by Laurena Slimmer (Float)         CT ABD PELV W CONT   Final Result   Stable pancreatitis.         [place] ABD LTD   Final Result      CT ABD PELV W CONT   Final Result   Moderate acute pancreatitis with edema of the adjacent gastric wall           PHYSICAL  EXAM:    Physical Exam  Vitals and nursing note reviewed.   Constitutional:       Appearance: He is obese.   HENT:      Head: Normocephalic and atraumatic.   Eyes:      General: Scleral icterus present.   Cardiovascular:      Rate and Rhythm: Normal rate and regular rhythm.   Pulmonary:      Effort: No respiratory distress.      Breath sounds: No wheezing.   Abdominal:      General: Bowel sounds are normal. There is no distension.      Palpations: Abdomen is soft.      Tenderness: There is abdominal tenderness.      Comments: Negative for cullen or turners sign    Genitourinary:     Comments: No foley present  Musculoskeletal:      Right lower leg: No edema.      Left lower leg: No edema.   Skin:     General: Skin is warm.   Neurological:      Mental Status: He is alert and oriented to person, place, and time.   Psychiatric:         Mood and Affect: Mood normal.          Data Review    Recent Results (from the past 24 hour(s))   GLUCOSE, POC    Collection Time: 08/22/20  4:38 PM   Result Value Ref Range    Glucose (POC) 138 (H) 65 - 117 mg/dL    Performed by Earline [place]    GLUCOSE, POC    Collection Time: 08/22/20  8:24 PM   Result Value Ref Range    Glucose (POC) 199 (H) 65 - 117 mg/dL    Performed by [place], POC    Collection Time: 08/23/20  8:14 AM   Result Value Ref Range    Glucose (POC) 166 (H) 65 - 117 mg/dL    Performed by [place], COMPREHENSIVE    Collection Time: 08/23/20  8:15 AM   Result Value Ref Range  Sodium 136 136 - 145 mmol/L    Potassium 2.8 (L) 3.5 - 5.1 mmol/L    Chloride 102 97 - 108 mmol/L    CO2 24 21 - 32 mmol/L    Anion gap 10 5 - 15 mmol/L    Glucose 187 (H) 65 - 100 mg/dL    BUN 2 (L) 6 - 20 mg/dL    Creatinine 1.06 0.70 - 1.30 mg/dL    BUN/Creatinine ratio 2 (L) 12 - 20      GFR est AA >60 >60 ml/min/1.36m    GFR est non-AA >60 >60 ml/min/1.751m   Calcium 8.6 8.5 - 10.1 mg/dL    Bilirubin, total 0.6 0.2 - 1.0 mg/dL    AST (SGOT) 27 15 -  37 U/L    ALT (SGPT) 52 12 - 78 U/L    Alk. phosphatase 163 (H) 45 - 117 U/L    Protein, total 7.3 6.4 - 8.2 g/dL    Albumin 2.7 (L) 3.5 - 5.0 g/dL    Globulin 4.6 (H) 2.0 - 4.0 g/dL    A-G Ratio 0.6 (L) 1.1 - 2.2     CBC WITH AUTOMATED DIFF    Collection Time: 08/23/20  8:15 AM   Result Value Ref Range    WBC 6.3 4.1 - 11.1 K/uL    RBC 4.06 (L) 4.10 - 5.70 M/uL    HGB 11.4 (L) 12.1 - 17.0 g/dL    HCT 34.4 (L) 36.6 - 50.3 %    MCV 84.7 80.0 - 99.0 FL    MCH 28.1 26.0 - 34.0 PG    MCHC 33.1 30.0 - 36.5 g/dL    RDW 14.9 (H) 11.5 - 14.5 %    PLATELET 268 150 - 400 K/uL    MPV 11.3 8.9 - 12.9 FL    NRBC 0.0 0.0 PER 100 WBC    ABSOLUTE NRBC 0.00 0.00 - 0.01 K/uL    NEUTROPHILS 55 32 - 75 %    LYMPHOCYTES 29 12 - 49 %    MONOCYTES 14 (H) 5 - 13 %    EOSINOPHILS 1 0 - 7 %    BASOPHILS 0 0 - 1 %    IMMATURE GRANULOCYTES 1 (H) 0 - 0.5 %    ABS. NEUTROPHILS 3.5 1.8 - 8.0 K/UL    ABS. LYMPHOCYTES 1.9 0.8 - 3.5 K/UL    ABS. MONOCYTES 0.9 0.0 - 1.0 K/UL    ABS. EOSINOPHILS 0.0 0.0 - 0.4 K/UL    ABS. BASOPHILS 0.0 0.0 - 0.1 K/UL    ABS. IMM. GRANS. 0.0 0.00 - 0.04 K/UL    DF AUTOMATED     LIPASE    Collection Time: 08/23/20  8:15 AM   Result Value Ref Range    Lipase 2,664 (H) 73 - 393 U/L   C REACTIVE PROTEIN, QT    Collection Time: 08/23/20  8:15 AM   Result Value Ref Range    C-Reactive protein 7.38 (H) 0.00 - 0.60 mg/dL   PROCALCITONIN    Collection Time: 08/23/20  8:15 AM   Result Value Ref Range    Procalcitonin 0.27 (H) 0 ng/mL   GLUCOSE, POC    Collection Time: 08/23/20 11:37 AM   Result Value Ref Range    Glucose (POC) 188 (H) 65 - 117 mg/dL    Performed by PrLaurena SlimmerFloat)         Assessment/Plan:     Principal Problem:    Pancreatitis (08/18/2020)  Active Problems:    Acute pancreatitis (08/19/2020)      Acute cystitis with hematuria (08/20/2020)        Hospital Course:    Antonio RINGER Sr. is a 60 y.o. male with a PMH of diabetes, hypertension, alcohol abuse, and hyperlipidemia who presented to the  freestanding ED with abdominal pain. In FSED tachycardic and hypertensive. Initial labs significant for WBC of 12.5, sodium of 126, glucose of 273, bilirubin of 1.9, ALT of 241, AST of 347, alk phosphatase of 300, lipase of 1093, lactate of 4.6, and BNP of 541. Urinalysis with proteinuria, glucosuria, ketonuria, hematuria, pyuria and leuk esterase with bacteria. CT of the abdomen showed moderate acute pancreatitis with edema of the adjacent gastric wall. Patient admitted for further work-up. Patient started on IV Pepcid, fluids, Zosyn, and pain control. Blood culture pending. GI and general surgery consulted. General surgery deemed not a surgical candidate at this time. Patient WBC, CRP, and procalcitonin elevated. ID consulted. Will add vancomycin for empiric coverage. No growth to date with blood culture we will discontinue vancomycin. Urine culture grew E Coli. Repeat CT abd/pelv shows stable pancreatitis.    Pancreatitis secondary to etoh abuse  Lipase 1093>697>424>1045-->2320-->2021-->2664  Continue on IVF and pain control  Advance diet per GI: NPO  GI and general surgery following  Repeat CT abd/pelv shows stable pancreatitis.  ??  Sepsis, POA with tachycardia, WBC elevated and lactic  Down-trending inflammatory markers, trend  Continue on zosyn and IVF  No growth to date with blood culture we will discontinue vancomycin  6/23 blood: NGTD, prelim  ID following  ??  Urinary tract infection  6/23 urine: E Coli  Zosyn dc'd 6/27, merrem initiated  ??  Elevated transamidase  Hepatitis panel non-reactive  ??  Diabetes Mellitus, type II  A1c 11.6  Continue 17 units of NPH  SSI as needed  ??  Hypertension  BP currently at goal  Continue on metoprolol and lisinopril  Prn hydralazine  ??  Hyperlipidemia  Hold pravastatin  ??  Alcohol use  Continue on librium, wean as tolerated  CIWA protocol in place    Hypokalemia  K 2.8  Replenish with PO and IV K  ??    DVT Prophylaxis: lovenox  GI Prophylaxis: pepcid  Discharge and  disposition barriers:   clinical improvement  24 - 48 hours  Improvement in lipase   tolerating PO intake      Above treatment plan and diagnostic results discussed with patient in detail a t bedside, all questions answered.     Care Plan discussed with: Interdisciplinary team    Total time spent with patient: 35 minutes.

## 2020-08-23 NOTE — Progress Notes (Signed)
Problem: Pain  Goal: *Control of Pain  Outcome: Progressing Towards Goal

## 2020-08-23 NOTE — Progress Notes (Signed)
Progress Note    Patient: Antonio LOMBARDO Sr. MRN: [id number]  SSN: [id number]    Date of Birth: [birth year]  Age: 60 y.o.  Sex: male      Admit Date: 08/18/2020    LOS: 4 days     Subjective:   GI in consultation for Pancreatitis    6/28: Patient seen resting. Family at the bedside. He does complain of abdominal pain, improved with pain medication. Lipase increased to 2,664. He is currently NPO.  Repeat CT of abdomen on 6/27 showing stable pancreatitis. No evidence of pancreatic necrosis. Negative for abscess. He is on IV merrem. Continue IV hydration.    History of Present Illness:??Antonio Bast Sr.??is a 60 y.o.??male??who is seen in consultation for Pancreatitis. Mr. Poche presented to the ED with complaints of abdominal pain, nausea, and vomiting. He reports his abdominal pain is on and off. He reports he has been drinking about a 12 pack of beer or bottles of alcohol nearly every day for the past 3 weeks. He denies fever, hematemesis, or hemoptysis. He denies tobacco use.His last alcoholic drink was on Tuesday. He does have abdominal tenderness with light palpation. He reports frequent hiccups. He has a past medical history significant for hypertension, type 2 diabetes, insulin dependent, and hyperlipidemia. His lipase on admission was over 1,000 currently it is 424. He is NPO with IV hydration. Potassium low at 2.9 this morning, he was given IV potassium. Total bilirubin 1.6, ALT 124, AST 111, Alk Phosphatase 200. CT abdomen shows moderate acute pancreatitis with edema of the adjacent gastric wall and borderline fatty liver. His acute hepatitis panel is negative.??      Objective:     Vitals:    08/23/20 0827 08/23/20 1206 08/23/20 1308 08/23/20 1502   BP: (!) 171/93 (!) 208/126 (!) 151/89 (!) 168/107   Pulse: 88 99 80 99   Resp: '19  17 16   ' Temp: 98.1 ??F (36.7 ??C)  98.2 ??F (36.8 ??C) 97.8 ??F (36.6 ??C)   SpO2: 98%  98% 98%   Weight:       Height:            Intake and Output:  Current Shift: No  intake/output data recorded.  Last three shifts: 06/26 1901 - 06/28 0700  In: -   Out: 650 [Urine:650]    Physical Exam:   Skin:  Extremities and face reveal no rashes. No palmer erythema.   HEENT: Sclerae anicteric. Extra-occular muscles are intact. No abnormal pigmentation of the lips. The neck is supple.  Cardiovascular: Regular rate and rhythm.  Respiratory:  Comfortable breathing with no accessory muscle use.   GI:  Abdomen nondistended, soft, and nontender. No enlargement of the liver or spleen. No masses palpable.  Rectal:  Deferred  Musculoskeletal: Extremities have good range of motion.  Neurological:  Gross memory appears intact.  Patient is alert and oriented.  Psychiatric:  Mood appears appropriate with judgement intact.  Lymphatic:  No visible adenopathy      Lab/Data Review:  Recent Results (from the past 24 hour(s))   GLUCOSE, POC    Collection Time: 08/22/20  4:38 PM   Result Value Ref Range    Glucose (POC) 138 (H) 65 - 117 mg/dL    Performed by Earline Mayotte    GLUCOSE, POC    Collection Time: 08/22/20  8:24 PM   Result Value Ref Range    Glucose (POC) 199 (H) 65 - 117 mg/dL    Performed by  Rucker Seth Bake    GLUCOSE, POC    Collection Time: 08/23/20  8:14 AM   Result Value Ref Range    Glucose (POC) 166 (H) 65 - 117 mg/dL    Performed by [place]    Collection Time: 08/23/20  8:14 AM   Result Value Ref Range    Magnesium 1.4 (L) 1.6 - 2.4 mg/dL   METABOLIC PANEL, COMPREHENSIVE    Collection Time: 08/23/20  8:15 AM   Result Value Ref Range    Sodium 136 136 - 145 mmol/L    Potassium 2.8 (L) 3.5 - 5.1 mmol/L    Chloride 102 97 - 108 mmol/L    CO2 24 21 - 32 mmol/L    Anion gap 10 5 - 15 mmol/L    Glucose 187 (H) 65 - 100 mg/dL    BUN 2 (L) 6 - 20 mg/dL    Creatinine 1.06 0.70 - 1.30 mg/dL    BUN/Creatinine ratio 2 (L) 12 - 20      GFR est AA >60 >60 ml/min/1.33m    GFR est non-AA >60 >60 ml/min/1.732m   Calcium 8.6 8.5 - 10.1 mg/dL    Bilirubin, total 0.6 0.2 - 1.0 mg/dL    AST  (SGOT) 27 15 - 37 U/L    ALT (SGPT) 52 12 - 78 U/L    Alk. phosphatase 163 (H) 45 - 117 U/L    Protein, total 7.3 6.4 - 8.2 g/dL    Albumin 2.7 (L) 3.5 - 5.0 g/dL    Globulin 4.6 (H) 2.0 - 4.0 g/dL    A-G Ratio 0.6 (L) 1.1 - 2.2     CBC WITH AUTOMATED DIFF    Collection Time: 08/23/20  8:15 AM   Result Value Ref Range    WBC 6.3 4.1 - 11.1 K/uL    RBC 4.06 (L) 4.10 - 5.70 M/uL    HGB 11.4 (L) 12.1 - 17.0 g/dL    HCT 34.4 (L) 36.6 - 50.3 %    MCV 84.7 80.0 - 99.0 FL    MCH 28.1 26.0 - 34.0 PG    MCHC 33.1 30.0 - 36.5 g/dL    RDW 14.9 (H) 11.5 - 14.5 %    PLATELET 268 150 - 400 K/uL    MPV 11.3 8.9 - 12.9 FL    NRBC 0.0 0.0 PER 100 WBC    ABSOLUTE NRBC 0.00 0.00 - 0.01 K/uL    NEUTROPHILS 55 32 - 75 %    LYMPHOCYTES 29 12 - 49 %    MONOCYTES 14 (H) 5 - 13 %    EOSINOPHILS 1 0 - 7 %    BASOPHILS 0 0 - 1 %    IMMATURE GRANULOCYTES 1 (H) 0 - 0.5 %    ABS. NEUTROPHILS 3.5 1.8 - 8.0 K/UL    ABS. LYMPHOCYTES 1.9 0.8 - 3.5 K/UL    ABS. MONOCYTES 0.9 0.0 - 1.0 K/UL    ABS. EOSINOPHILS 0.0 0.0 - 0.4 K/UL    ABS. BASOPHILS 0.0 0.0 - 0.1 K/UL    ABS. IMM. GRANS. 0.0 0.00 - 0.04 K/UL    DF AUTOMATED     LIPASE    Collection Time: 08/23/20  8:15 AM   Result Value Ref Range    Lipase 2,664 (H) 73 - 393 U/L   C REACTIVE PROTEIN, QT    Collection Time: 08/23/20  8:15 AM   Result Value Ref Range    C-Reactive  protein 7.38 (H) 0.00 - 0.60 mg/dL   PROCALCITONIN    Collection Time: 08/23/20  8:15 AM   Result Value Ref Range    Procalcitonin 0.27 (H) 0 ng/mL   GLUCOSE, POC    Collection Time: 08/23/20 11:37 AM   Result Value Ref Range    Glucose (POC) 188 (H) 65 - 117 mg/dL    Performed by Laurena Slimmer (Float)               CT ABD PELV W CONT   Final Result   Stable pancreatitis.         [place] ABD LTD   Final Result      CT ABD PELV W CONT   Final Result   Moderate acute pancreatitis with edema of the adjacent gastric wall          Assessment:     Principal Problem:    Pancreatitis (08/18/2020)    Active Problems:    Acute pancreatitis  (08/19/2020)      Acute cystitis with hematuria (08/20/2020)        Plan:   1. Pancreatitis  ??????clear Liquid diet  ??????Continue IV hydration  ??????Lipase in the am  ??????CT abdomen 6/23: Moderate acute pancreatitis with edema of the adjacent gastric wall      Repeat CT abdomen 6/27: stable pancreatitis. No evidence of pancreatic necrosis. Negative for abscess.       Ca 19-9 pending  ??2. Transaminitis??(trending down)  ??????????Hx of ETOH abuse  ??????????CT abdomen: Borderline fatty liver  ??????????Negative Acute Hepatitis Panel  ??????????Continue CIWA protocol  3. Frequent Hiccups  ????????Baclofen 5 mg TID  4. Type 2 Diabetes  ??????Continue Home medications  ??????Continue bedside glucose monitoring    Thank you for allowing me to participate in this patients care.  Plan discussed with Dr. Karel Jarvis and he approves.    Signed By: Terrace [place], NP     August 23, 2020

## 2020-08-23 NOTE — Progress Notes (Signed)
Progress Notes by Jene Every, MD at 08/23/20 1856                Author: Jene Every, MD  Service: MEDICINE  Author Type: Physician       Filed: 08/23/20 1903  Date of Service: 08/23/20 1856  Status: Signed          Editor: Jene Every, MD (Physician)                                    Infectious Disease Progress Note               Subjective:     Stable, subjectively better. Tolerating clear liquid diet, abdominal pain is better. Lipase still trending up.   CRP down to 7.38 from 24.60, and Procal 0.27     Objective:     Physical Exam:       Visit Vitals      BP  (!) 168/107     Pulse  99     Temp  97.8 ??F (36.6 ??C)     Resp  16     Ht  5\' 9"  (1.753 m)     Wt  192 lb (87.1 kg)     SpO2  98%        BMI  28.35 kg/m??        O2 Device:  None (Room air)      Temp (24hrs), Avg:98.2 ??F (36.8 ??C), Min:97.8 ??F (36.6 ??C), Max:98.7 ??F (37.1 ??C)     No intake/output data recorded.    06/26 1901 - 06/28 0700   In: -    Out: 650 [Urine:650]      General: NAD, alert, AAO x 4   HEENT: PERLA, Moist mucosa    Lungs: CTA b/l    Heart: S1S2+, RRR, no murmur   Abdo: Soft, LUQ tenderness on deep palpation    GU: no foley cath    Exts: No edema, + pulses b/l    Skin: No wounds, No rashes or lesions           Data Review:          Recent Days:     Recent Labs             08/23/20   0815  08/22/20   0953  08/21/20   1237     WBC  6.3  6.7  5.5     HGB  11.4*  11.0*  11.7*     HCT  34.4*  33.2*  35.4*          PLT  268  226  189          Recent Labs             08/23/20   0815  08/22/20   0953  08/21/20   1237     BUN  2*  5*  7          CREA  1.06  1.08  1.02             Lab Results         Component  Value  Date/Time            C-Reactive protein  7.38 (H)  08/23/2020 08:15 AM            Microbiology  Results               Procedure  Component  Value  Units  Date/Time           CULTURE, URINE [[id number]]  (Abnormal)  (Susceptibility)  Collected: 08/18/20 1209            Order Status: Completed   Specimen: Urine  Updated: 08/20/20 1317                Special Requests:  No Special Requests              Colony Count  --                  >100,000   colonies/ml                   Culture result:  Escherichia coli                 Susceptibility           Escherichia coli          MIC          Amikacin ($)  Susceptible          Ampicillin ($)  Susceptible          Ampicillin/sulbactam ($)  Susceptible          Cefazolin ($)  Susceptible          Cefepime ($$)  Susceptible          Cefoxitin  Susceptible          Ceftazidime ($)  Susceptible          Ceftriaxone ($)  Susceptible          Ciprofloxacin ($)  Susceptible          Gentamicin ($)  Susceptible          Levofloxacin ($)  Susceptible          Meropenem ($$)  Susceptible          Nitrofurantoin  Susceptible          Piperacillin/Tazobac ($)  Susceptible          Tobramycin ($)  Susceptible          Trimeth/Sulfa  Susceptible                                   Linear View                                            CULTURE, BLOOD, PAIRED [[id number]]  Collected: 08/18/20 1119            Order Status: Completed  Specimen: Blood  Updated: 08/23/20 0943                Special Requests:  No Special Requests                     Culture result:  No growth 5 days                         Diagnostics      CXR Results   (Last 48 hours)          None  CT abdo/pel w contrast 06/27: Stable findings of pancreatitis with peripancreatic fluid and edema with   extension into the transverse mesocolon. There is no evidence of pancreatic   necrosis. Negative for abscess. There is reactive wall thickening of the distal   duodenum and proximal jejunum. There is no evidence of bowel obstruction.   ??   Trace free fluid noted. The appendix is normal. Urinary bladder is empty.   Prostate gland and seminal vesicles are unremarkable.   ??   No suspicious lytic or blastic skeletal lesion apparent.        Assessment/Plan        1. Acute severe pancreatitis, alcohol associated,  prior cholecystectomy        Remains afebrile w a normal WBC on routine labs, CRP and Procal trending down        No abscess or pancreatic necrosis on repeat CT        Abdominal pain is better, lipase still elevated        Continue on Meropenem day # 2. Routine labs in the morning   ??   2. UTI, Pansenstive E.coli isolated from Cx, S/p appropriate tx    ??   3. Alcoholism, due to depression per pt    ??   4. Abdominal pain due to # 1, better, continue symptomatic mgt       Dispo: Will like to see declining lipase prior to d/c. Not anticipating d/c on IV antibiotics       Jene Every, MD      08/23/2020

## 2020-08-23 NOTE — Progress Notes (Signed)
Bedside shift report was given to Serena, RN.

## 2020-08-24 LAB — CBC WITH AUTOMATED DIFF
ABS. BASOPHILS: 0 10*3/uL (ref 0.0–0.1)
ABS. EOSINOPHILS: 0.1 10*3/uL (ref 0.0–0.4)
ABS. IMM. GRANS.: 0.1 10*3/uL — ABNORMAL HIGH (ref 0.00–0.04)
ABS. LYMPHOCYTES: 2.1 10*3/uL (ref 0.8–3.5)
ABS. MONOCYTES: 1.3 10*3/uL — ABNORMAL HIGH (ref 0.0–1.0)
ABS. NEUTROPHILS: 4.3 10*3/uL (ref 1.8–8.0)
ABSOLUTE NRBC: 0 10*3/uL (ref 0.00–0.01)
BASOPHILS: 0 % (ref 0–1)
EOSINOPHILS: 1 % (ref 0–7)
HCT: 38.8 % (ref 36.6–50.3)
HGB: 12.5 g/dL (ref 12.1–17.0)
IMMATURE GRANULOCYTES: 1 % — ABNORMAL HIGH (ref 0–0.5)
LYMPHOCYTES: 27 % (ref 12–49)
MCH: 27.7 PG (ref 26.0–34.0)
MCHC: 32.2 g/dL (ref 30.0–36.5)
MCV: 85.8 FL (ref 80.0–99.0)
MONOCYTES: 17 % — ABNORMAL HIGH (ref 5–13)
MPV: 11.1 FL (ref 8.9–12.9)
NEUTROPHILS: 54 % (ref 32–75)
NRBC: 0 PER 100 WBC
PLATELET: 384 10*3/uL (ref 150–400)
RBC: 4.52 M/uL (ref 4.10–5.70)
RDW: 15.7 % — ABNORMAL HIGH (ref 11.5–14.5)
WBC: 7.8 10*3/uL (ref 4.1–11.1)

## 2020-08-24 LAB — METABOLIC PANEL, COMPREHENSIVE
A-G Ratio: 0.7 — ABNORMAL LOW (ref 1.1–2.2)
ALT (SGPT): 45 U/L (ref 12–78)
AST (SGOT): 22 U/L (ref 15–37)
Albumin: 3 g/dL — ABNORMAL LOW (ref 3.5–5.0)
Alk. phosphatase: 176 U/L — ABNORMAL HIGH (ref 45–117)
Anion gap: 6 mmol/L (ref 5–15)
BUN/Creatinine ratio: 4 — ABNORMAL LOW (ref 12–20)
BUN: 4 mg/dL — ABNORMAL LOW (ref 6–20)
Bilirubin, total: 0.6 mg/dL (ref 0.2–1.0)
CO2: 25 mmol/L (ref 21–32)
Calcium: 8.9 mg/dL (ref 8.5–10.1)
Chloride: 108 mmol/L (ref 97–108)
Creatinine: 1.1 mg/dL (ref 0.70–1.30)
GFR est AA: >60 mL/min/{1.73_m2} (ref >60)
GFR est non-AA: >60 mL/min/{1.73_m2} (ref >60)
Globulin: 4.3 g/dL — ABNORMAL HIGH (ref 2.0–4.0)
Glucose: 214 mg/dL — ABNORMAL HIGH (ref 65–100)
Potassium: 4.1 mmol/L (ref 3.5–5.1)
Protein, total: 7.3 g/dL (ref 6.4–8.2)
Sodium: 139 mmol/L (ref 136–145)

## 2020-08-24 LAB — LIPASE
Lipase: 2031 U/L — ABNORMAL HIGH (ref 73–393)
Lipase: 2031 U/L — ABNORMAL HIGH (ref 73–393)

## 2020-08-24 LAB — GLUCOSE, POC
Glucose (POC): 228 mg/dL — ABNORMAL HIGH (ref 65–117)
Glucose (POC): 184 mg/dL — ABNORMAL HIGH (ref 65–117)
Glucose (POC): 318 mg/dL — ABNORMAL HIGH (ref 65–117)

## 2020-08-24 LAB — CULTURE, BLOOD, PAIRED
Culture result:: NO GROWTH
Culture: NO GROWTH

## 2020-08-24 LAB — CBC WITH AUTO DIFFERENTIAL
Basophils %: 0 % (ref 0–1)
Basophils Absolute: 0 10*3/uL (ref 0.0–0.1)
Eosinophils %: 1 % (ref 0–7)
Eosinophils Absolute: 0.1 10*3/uL (ref 0.0–0.4)
Granulocyte Absolute Count: 0.1 10*3/uL — ABNORMAL HIGH (ref 0.00–0.04)
Hematocrit: 38.8 % (ref 36.6–50.3)
Hemoglobin: 12.5 g/dL (ref 12.1–17.0)
Immature Granulocytes: 1 % — ABNORMAL HIGH (ref 0–0.5)
Lymphocytes %: 27 % (ref 12–49)
Lymphocytes Absolute: 2.1 10*3/uL (ref 0.8–3.5)
MCH: 27.7 PG (ref 26.0–34.0)
MCHC: 32.2 g/dL (ref 30.0–36.5)
MCV: 85.8 FL (ref 80.0–99.0)
MPV: 11.1 FL (ref 8.9–12.9)
Monocytes %: 17 % — ABNORMAL HIGH (ref 5–13)
Monocytes Absolute: 1.3 10*3/uL — ABNORMAL HIGH (ref 0.0–1.0)
NRBC Absolute: 0 10*3/uL (ref 0.00–0.01)
Neutrophils %: 54 % (ref 32–75)
Neutrophils Absolute: 4.3 10*3/uL (ref 1.8–8.0)
Nucleated RBCs: 0 PER 100 WBC
Platelets: 384 10*3/uL (ref 150–400)
RBC: 4.52 M/uL (ref 4.10–5.70)
RDW: 15.7 % — ABNORMAL HIGH (ref 11.5–14.5)
WBC: 7.8 10*3/uL (ref 4.1–11.1)

## 2020-08-24 LAB — COMPREHENSIVE METABOLIC PANEL
ALT: 45 U/L (ref 12–78)
AST: 22 U/L (ref 15–37)
Albumin/Globulin Ratio: 0.7 — ABNORMAL LOW (ref 1.1–2.2)
Albumin: 3 g/dL — ABNORMAL LOW (ref 3.5–5.0)
Alkaline Phosphatase: 176 U/L — ABNORMAL HIGH (ref 45–117)
Anion Gap: 6 mmol/L (ref 5–15)
BUN: 4 mg/dL — ABNORMAL LOW (ref 6–20)
Bun/Cre Ratio: 4 — ABNORMAL LOW (ref 12–20)
CO2: 25 mmol/L (ref 21–32)
Calcium: 8.9 mg/dL (ref 8.5–10.1)
Chloride: 108 mmol/L (ref 97–108)
Creatinine: 1.1 mg/dL (ref 0.70–1.30)
EGFR IF NonAfrican American: >60 mL/min/{1.73_m2} (ref >60)
GFR African American: >60 mL/min/{1.73_m2} (ref >60)
Globulin: 4.3 g/dL — ABNORMAL HIGH (ref 2.0–4.0)
Glucose: 214 mg/dL — ABNORMAL HIGH (ref 65–100)
Potassium: 4.1 mmol/L (ref 3.5–5.1)
Sodium: 139 mmol/L (ref 136–145)
Total Bilirubin: 0.6 mg/dL (ref 0.2–1.0)
Total Protein: 7.3 g/dL (ref 6.4–8.2)

## 2020-08-24 LAB — POCT GLUCOSE
POC Glucose: 228 mg/dL — ABNORMAL HIGH (ref 65–117)
POC Glucose: 184 mg/dL — ABNORMAL HIGH (ref 65–117)
POC Glucose: 318 mg/dL — ABNORMAL HIGH (ref 65–117)

## 2020-08-24 MED ORDER — MAGNESIUM SULFATE 2 GRAM/50 ML IVPB
2 gram/50 mL (4 %) | Freq: Once | INTRAVENOUS | Status: AC
Start: 2020-08-24 — End: 2020-08-24
  Administered 2020-08-24: 17:00:00 via INTRAVENOUS

## 2020-08-24 MED ORDER — POTASSIUM CHLORIDE 10 MEQ/100 ML IV PIGGY BACK
10 mEq/0 mL | Freq: Once | INTRAVENOUS | Status: AC
Start: 2020-08-24 — End: 2020-08-23
  Administered 2020-08-24: 02:00:00 via INTRAVENOUS

## 2020-08-24 MED ORDER — MORPHINE 2 MG/ML INJECTION
2 mg/mL | Freq: Once | INTRAMUSCULAR | Status: AC
Start: 2020-08-24 — End: 2020-08-24
  Administered 2020-08-24: 18:00:00 via INTRAVENOUS

## 2020-08-24 MED ORDER — OXYCODONE-ACETAMINOPHEN 5 MG-325 MG TAB
5-325 mg | ORAL | Status: DC | PRN
Start: 2020-08-24 — End: 2020-08-26
  Administered 2020-08-24 – 2020-08-26 (×10): via ORAL

## 2020-08-24 MED FILL — FAMOTIDINE (PF) 20 MG/2 ML IV: 20 mg/2 mL | INTRAVENOUS | Qty: 2

## 2020-08-24 MED FILL — MEROPENEM 1 GRAM IV SOLR: 1 gram | INTRAVENOUS | Qty: 1

## 2020-08-24 MED FILL — CHLORDIAZEPOXIDE 5 MG CAP: 5 mg | ORAL | Qty: 1

## 2020-08-24 MED FILL — HUMULIN N NPH U-100 INSULIN (ISOPHANE SUSP) 100 UNIT/ML SUBCUTANEOUS: 100 unit/mL | SUBCUTANEOUS | Qty: 1

## 2020-08-24 MED FILL — MORPHINE 2 MG/ML INJECTION: 2 mg/mL | INTRAMUSCULAR | Qty: 1

## 2020-08-24 MED FILL — ENOXAPARIN 40 MG/0.4 ML SUB-Q SYRINGE: 40 mg/0.4 mL | SUBCUTANEOUS | Qty: 0.4

## 2020-08-24 MED FILL — METOPROLOL TARTRATE 25 MG TAB: 25 mg | ORAL | Qty: 1

## 2020-08-24 MED FILL — POTASSIUM CHLORIDE 10 MEQ/100 ML IV PIGGY BACK: 10 mEq/0 mL | INTRAVENOUS | Qty: 100

## 2020-08-24 MED FILL — HUMULIN N NPH U-100 INSULIN (ISOPHANE SUSP) 100 UNIT/ML SUBCUTANEOUS: 100 unit/mL | SUBCUTANEOUS | Qty: 17

## 2020-08-24 MED FILL — MAGNESIUM SULFATE 2 GRAM/50 ML IVPB: 2 gram/50 mL (4 %) | INTRAVENOUS | Qty: 50

## 2020-08-24 MED FILL — PROCHLORPERAZINE EDISYLATE 5 MG/ML INJECTION: 5 mg/mL | INTRAMUSCULAR | Qty: 2

## 2020-08-24 MED FILL — POTASSIUM CHLORIDE SR 10 MEQ TAB: 10 mEq | ORAL | Qty: 4

## 2020-08-24 MED FILL — BACLOFEN 10 MG TAB: 10 mg | ORAL | Qty: 1

## 2020-08-24 MED FILL — HUMALOG U-100 INSULIN 100 UNIT/ML SUBCUTANEOUS SOLUTION: 100 unit/mL | SUBCUTANEOUS | Qty: 4

## 2020-08-24 MED FILL — NS WITH POTASSIUM CHLORIDE 40 MEQ/L IV: 40 mEq/L | INTRAVENOUS | Qty: 1000

## 2020-08-24 MED FILL — LISINOPRIL 40 MG TAB: 40 mg | ORAL | Qty: 1

## 2020-08-24 MED FILL — HUMALOG U-100 INSULIN 100 UNIT/ML SUBCUTANEOUS SOLUTION: 100 unit/mL | SUBCUTANEOUS | Qty: 3

## 2020-08-24 MED FILL — HYDRALAZINE 20 MG/ML IJ SOLN: 20 mg/mL | INTRAMUSCULAR | Qty: 1

## 2020-08-24 MED FILL — HUMALOG U-100 INSULIN 100 UNIT/ML SUBCUTANEOUS SOLUTION: 100 unit/mL | SUBCUTANEOUS | Qty: 5

## 2020-08-24 MED FILL — OXYCODONE-ACETAMINOPHEN 5 MG-325 MG TAB: 5-325 mg | ORAL | Qty: 1

## 2020-08-24 NOTE — Progress Notes (Signed)
Awake alert, resting quietly on right side.report pain of 8/10, reassured pain medication will be due  At 8:30.

## 2020-08-24 NOTE — Progress Notes (Signed)
Mamie Laurel NP notified of patient NPO status and Blood sugar of 228, states to give the sliding scale but not the NPH.

## 2020-08-24 NOTE — Progress Notes (Signed)
Progress Notes by Jene Every, MD at 08/24/20 1659                Author: Jene Every, MD  Service: MEDICINE  Author Type: Physician       Filed: 08/24/20 1701  Date of Service: 08/24/20 1659  Status: Signed          Editor: Jene Every, MD (Physician)                                    Infectious Disease Progress Note               Subjective:     Doing well clinically, lipase trending down, afebrile, reports decreased abdominal pain     Objective:     Physical Exam:       Visit Vitals      BP  (!) 171/113     Pulse  92     Temp  97.9 ??F (36.6 ??C)     Resp  16     Ht  5\' 9"  (1.753 m)     Wt  192 lb (87.1 kg)     SpO2  100%        BMI  28.35 kg/m??        O2 Device:  None (Room air)      Temp (24hrs), Avg:98 ??F (36.7 ??C), Min:97.5 ??F (36.4 ??C), Max:98.5 ??F (36.9 ??C)     06/29 0701 - 06/29 1900   In: -    Out: 420 [Urine:420]   No intake/output data recorded.      General: NAD, alert, AAO x 4   HEENT: PERLA, Moist mucosa    Lungs: CTA b/l, decreased at the bases    Heart: S1S2+, RRR, no murmur   Abdo: Soft, Decreased LUQ pain on deep palpation    GU: no foley cath    Exts: No edema, + pulses b/l    Skin: No wounds, No rashes or lesions           Data Review:          Recent Days:     Recent Labs             08/24/20   1135  08/23/20   0815  08/22/20   0953     WBC  7.8  6.3  6.7     HGB  12.5  11.4*  11.0*     HCT  38.8  34.4*  33.2*          PLT  384  268  226          Recent Labs             08/24/20   1135  08/23/20   0815  08/22/20   0953     BUN  4*  2*  5*          CREA  1.10  1.06  1.08             Lab Results         Component  Value  Date/Time            C-Reactive protein  7.38 (H)  08/23/2020 08:15 AM            Microbiology         Results  Procedure  Component  Value  Units  Date/Time           CULTURE, URINE [[id number]]  (Abnormal)  (Susceptibility)  Collected: 08/18/20 1209            Order Status: Completed  Specimen: Urine  Updated: 08/20/20 1317                 Special Requests:  No Special Requests              Colony Count  --                  >100,000   colonies/ml                   Culture result:  Escherichia coli                 Susceptibility           Escherichia coli          MIC          Amikacin ($)  Susceptible          Ampicillin ($)  Susceptible          Ampicillin/sulbactam ($)  Susceptible          Cefazolin ($)  Susceptible          Cefepime ($$)  Susceptible          Cefoxitin  Susceptible          Ceftazidime ($)  Susceptible          Ceftriaxone ($)  Susceptible          Ciprofloxacin ($)  Susceptible          Gentamicin ($)  Susceptible          Levofloxacin ($)  Susceptible          Meropenem ($$)  Susceptible          Nitrofurantoin  Susceptible          Piperacillin/Tazobac ($)  Susceptible          Tobramycin ($)  Susceptible          Trimeth/Sulfa  Susceptible                                   Linear View                                            CULTURE, BLOOD, PAIRED [[id number]]  Collected: 08/18/20 1119            Order Status: Completed  Specimen: Blood  Updated: 08/24/20 0915                Special Requests:  No Special Requests                     Culture result:  No growth 6 days                         Diagnostics      CXR Results   (Last 48 hours)          None                CT abdo/pel w contrast  06/27: Stable findings of pancreatitis with peripancreatic fluid and edema with   extension into the transverse mesocolon. There is no evidence of pancreatic   necrosis. Negative for abscess. There is reactive wall thickening of the distal   duodenum and proximal jejunum. There is no evidence of bowel obstruction.   ??   Trace free fluid noted. The appendix is normal. Urinary bladder is empty.   Prostate gland and seminal vesicles are unremarkable.   ??   No suspicious lytic or blastic skeletal lesion apparent.        Assessment/Plan        1. Acute severe pancreatitis, alcohol associated, prior cholecystectomy        No abscess or pancreatic  necrosis on repeat CT       Afebrile, lipase finally trending down, WBC remains within normal limits       Continue on Meropenem while hospitalized day # 3       Routine labs including lipase in the morning   ??   2. UTI, Pansenstive E.coli isolated from Cx, S/p appropriate tx    ??   3. Alcoholism, due to depression per pt    ??   4. Abdominal pain due to # 1, continue symptomatic mgt          Jene Every, MD      08/24/2020

## 2020-08-24 NOTE — Progress Notes (Signed)
Problem: Falls - Risk of  Goal: *Absence of Falls  Description: Document Schmid Fall Risk and appropriate interventions in the flowsheet.  Outcome: Progressing Towards Goal  Note: Fall Risk Interventions:            Medication Interventions: Bed/chair exit alarm,Patient to call before getting OOB,Teach patient to arise slowly                   Problem: Patient Education: Go to Patient Education Activity  Goal: Patient/Family Education  Outcome: Progressing Towards Goal

## 2020-08-24 NOTE — Progress Notes (Signed)
Patient up in bathroom. Company at bedside. Informed  Patient that no shower today per Marton Redwood NP. And will be resuming clear Liquid diet.

## 2020-08-24 NOTE — Progress Notes (Signed)
Hospitalist Progress Note    Subjective:   Daily Progress Note: 08/24/2020 7:59 AM    Patient examined alert and oriented sitting on bedside. He continues to report abdominal pain, worsen with PO intake. No overnight events reported. No acute distress noted on examination.    Current Facility-Administered Medications   Medication Dose Route Frequency   ??? magnesium sulfate 2 g/50 ml IVPB (premix or compounded)  2 g IntraVENous ONCE   ??? morphine injection 1 mg  1 mg IntraVENous Q4H PRN   ??? potassium chloride SR (KLOR-CON 10) tablet 40 mEq  40 mEq Oral BID   ??? 0.9% sodium chloride with KCl 40 mEq/L infusion   IntraVENous CONTINUOUS   ??? meropenem (MERREM) 1 g in 0.9% sodium chloride (MBP/ADV) 50 mL MBP  1 g IntraVENous Q8H   ??? chlordiazePOXIDE (LIBRIUM) capsule 5 mg  5 mg Oral TID   ??? prochlorperazine (COMPAZINE) injection 5 mg  5 mg IntraVENous Q3H PRN   ??? ketorolac (TORADOL) injection 30 mg  30 mg IntraVENous Q6H PRN   ??? baclofen (LIORESAL) tablet 5 mg  5 mg Oral TID   ??? famotidine (PF) (PEPCID) 20 mg in 0.9% sodium chloride 10 mL injection  20 mg IntraVENous Q12H   ??? hydrALAZINE (APRESOLINE) 20 mg/mL injection 10 mg  10 mg IntraVENous QID PRN   ??? sodium chloride (NS) flush 5-40 mL  5-40 mL IntraVENous Q8H   ??? sodium chloride (NS) flush 5-40 mL  5-40 mL IntraVENous PRN   ??? acetaminophen (TYLENOL) tablet 650 mg  650 mg Oral Q6H PRN    Or   ??? acetaminophen (TYLENOL) suppository 650 mg  650 mg Rectal Q6H PRN   ??? polyethylene glycol (MIRALAX) packet 17 g  17 g Oral DAILY PRN   ??? ondansetron (ZOFRAN ODT) tablet 4 mg  4 mg Oral Q8H PRN    Or   ??? ondansetron (ZOFRAN) injection 4 mg  4 mg IntraVENous Q6H PRN   ??? enoxaparin (LOVENOX) injection 40 mg  40 mg SubCUTAneous DAILY   ??? lisinopriL (PRINIVIL, ZESTRIL) tablet 40 mg  40 mg Oral DAILY   ??? metoprolol tartrate (LOPRESSOR) tablet 25 mg  25 mg Oral BID   ??? LORazepam (ATIVAN) injection 1 mg  1 mg IntraVENous Q2H PRN   ??? glucose chewable tablet 16 g  4 Tablet Oral PRN   ???  dextrose 10% infusion 0-250 mL  0-250 mL IntraVENous PRN   ??? glucagon (GLUCAGEN) injection 1 mg  1 mg IntraMUSCular PRN   ??? insulin NPH (NOVOLIN N, HUMULIN N) injection 17 Units  0.2 Units/kg SubCUTAneous ACB&D   ??? insulin lispro (HUMALOG) injection   SubCUTAneous AC&HS        Review of Systems  Review of Systems   Constitutional: Negative.    Respiratory: Negative.    Cardiovascular: Negative.    Gastrointestinal: Positive for abdominal pain. Negative for nausea and vomiting.   Neurological: Negative for dizziness, tremors and headaches.   All other systems reviewed and are negative.       Objective:     Visit Vitals  BP (!) 165/94 (BP 1 Location: Left upper arm, BP Patient Position: At rest;Lying right side)   Pulse (!) 108   Temp 98.5 ??F (36.9 ??C)   Resp 18   Ht '5\' 9"'  (1.753 m)   Wt 87.1 kg (192 lb)   SpO2 97%   BMI 28.35 kg/m??      O2 Device: None (Room air)  Temp (24hrs), Avg:98.1 ??F (36.7 ??C), Min:97.8 ??F (36.6 ??C), Max:98.5 ??F (36.9 ??C)      No intake/output data recorded.  No intake/output data recorded.    Recent Results (from the past 24 hour(s))   GLUCOSE, POC    Collection Time: 08/23/20 11:37 AM   Result Value Ref Range    Glucose (POC) 188 (H) 65 - 117 mg/dL    Performed by Laurena Slimmer (Float)    GLUCOSE, POC    Collection Time: 08/23/20  6:30 PM   Result Value Ref Range    Glucose (POC) 168 (H) 65 - 117 mg/dL    Performed by [place], POC    Collection Time: 08/23/20  7:28 PM   Result Value Ref Range    Glucose (POC) 185 (H) 65 - 117 mg/dL    Performed by Cathlean Marseilles    GLUCOSE, POC    Collection Time: 08/24/20  7:20 AM   Result Value Ref Range    Glucose (POC) 228 (H) 65 - 117 mg/dL    Performed by Marshell Garfinkel         CT ABD PELV W CONT   Final Result   Stable pancreatitis.         [place] ABD LTD   Final Result      CT ABD PELV W CONT   Final Result   Moderate acute pancreatitis with edema of the adjacent gastric wall           PHYSICAL EXAM:    Physical Exam  Vitals and  nursing note reviewed.   Constitutional:       Appearance: He is obese.   HENT:      Head: Normocephalic and atraumatic.   Eyes:      General: Scleral icterus present.   Cardiovascular:      Rate and Rhythm: Normal rate and regular rhythm.   Pulmonary:      Effort: No respiratory distress.      Breath sounds: No wheezing.   Abdominal:      General: Bowel sounds are normal. There is no distension.      Palpations: Abdomen is soft.      Tenderness: There is abdominal tenderness.      Comments: Negative for cullen or turners sign    Genitourinary:     Comments: No foley present  Musculoskeletal:      Right lower leg: No edema.      Left lower leg: No edema.   Skin:     General: Skin is warm.   Neurological:      Mental Status: He is alert and oriented to person, place, and time.   Psychiatric:         Mood and Affect: Mood normal.          Data Review    Recent Results (from the past 24 hour(s))   GLUCOSE, POC    Collection Time: 08/23/20 11:37 AM   Result Value Ref Range    Glucose (POC) 188 (H) 65 - 117 mg/dL    Performed by Laurena Slimmer (Float)    GLUCOSE, POC    Collection Time: 08/23/20  6:30 PM   Result Value Ref Range    Glucose (POC) 168 (H) 65 - 117 mg/dL    Performed by [place], POC    Collection Time: 08/23/20  7:28 PM   Result Value Ref Range    Glucose (POC) 185 (  H) 65 - 117 mg/dL    Performed by Cathlean Marseilles    GLUCOSE, POC    Collection Time: 08/24/20  7:20 AM   Result Value Ref Range    Glucose (POC) 228 (H) 65 - 117 mg/dL    Performed by Marshell Garfinkel         Assessment/Plan:     Principal Problem:    Pancreatitis (08/18/2020)    Active Problems:    Acute pancreatitis (08/19/2020)      Acute cystitis with hematuria (08/20/2020)        Hospital Course:    Antonio SALLY Sr. is a 60 y.o. male with a PMH of diabetes, hypertension, alcohol abuse, and hyperlipidemia who presented to the freestanding ED with abdominal pain. In FSED tachycardic and hypertensive. Initial labs  significant for WBC of 12.5, sodium of 126, glucose of 273, bilirubin of 1.9, ALT of 241, AST of 347, alk phosphatase of 300, lipase of 1093, lactate of 4.6, and BNP of 541. Urinalysis with proteinuria, glucosuria, ketonuria, hematuria, pyuria and leuk esterase with bacteria. CT of the abdomen showed moderate acute pancreatitis with edema of the adjacent gastric wall. Patient admitted for further work-up. Patient started on IV Pepcid, fluids, Zosyn, and pain control. Blood culture pending. GI and general surgery consulted. General surgery deemed not a surgical candidate at this time. Patient WBC, CRP, and procalcitonin elevated. ID consulted. Will add vancomycin for empiric coverage. No growth to date with blood culture we will discontinue vancomycin. Urine culture grew E Coli. Repeat CT abd/pelv shows stable pancreatitis.    Pancreatitis secondary to etoh abuse  Lipase 1093>697>424>1045-->2320-->2021-->2664  Continue on IVF and pain control  Advance diet per GI: clears  GI and general surgery following  Repeat CT abd/pelv shows stable pancreatitis.  ??  Sepsis, POA with tachycardia, WBC elevated and lactic  Down-trending inflammatory markers, trend  Continue on zosyn and IVF  No growth to date with blood culture we will discontinue vancomycin  6/23 blood: NGTD, prelim  ID following  ??  Urinary tract infection  6/23 urine: E Coli  Zosyn dc'd 6/27, merrem initiated  ??  Elevated transamidase  Hepatitis panel non-reactive  ??  Diabetes Mellitus, type II  A1c 11.6  Continue 17 units of NPH  SSI as needed  ??  Hypertension  BP currently at goal  Continue on metoprolol and lisinopril  Prn hydralazine  ??  Hyperlipidemia  Hold pravastatin  ??  Alcohol use  Continue on librium, wean as tolerated  CIWA protocol in place    Hypokalemia/Hypomagnesemia  K 2.8  Replenish with PO and IV K  Replenish with IV mag 2gm   Am labs pending  ??    DVT Prophylaxis: lovenox  GI Prophylaxis: pepcid  Discharge and disposition barriers:   clinical  improvement  24 - 48 hours  Improvement in lipase  Tolerating PO intake  AM labs pending      Above treatment plan and diagnostic results discussed with patient in detail a t bedside, all questions answered.     Care Plan discussed with: Interdisciplinary team    Total time spent with patient: 35 minutes.

## 2020-08-24 NOTE — Progress Notes (Signed)
Bedside shift report was given to [place], Charity fundraiser.

## 2020-08-24 NOTE — Progress Notes (Signed)
Progress Note    Patient: Antonio KASSEL Sr. MRN: [id number]  SSN: [id number]    Date of Birth: 11-19-1960  Age: 60 y.o.  Sex: male      Admit Date: 08/18/2020    LOS: 5 days     Subjective:   GI in consultation for Pancreatitis    6/29: Patient reports he continues to have abdominal pain. Lipase 2,031 this am. He is on clear liquid diet and IV fluids. Repeat CT of abdomen on 6/27 showing stable pancreatitis. No evidence of pancreatic necrosis. Negative for abscess. He is on IV merrem.    History of Present Illness:??Antonio Bast Sr.??is a 60 y.o.??male??who is seen in consultation for Pancreatitis. Antonio Melendez presented to the ED with complaints of abdominal pain, nausea, and vomiting. He reports his abdominal pain is on and off. He reports he has been drinking about a 12 pack of beer or bottles of alcohol nearly every day for the past 3 weeks. He denies fever, hematemesis, or hemoptysis. He denies tobacco use.His last alcoholic drink was on Tuesday. He does have abdominal tenderness with light palpation. He reports frequent hiccups. He has a past medical history significant for hypertension, type 2 diabetes, insulin dependent, and hyperlipidemia. His lipase on admission was over 1,000 currently it is 424. He is NPO with IV hydration. Potassium low at 2.9 this morning, he was given IV potassium. Total bilirubin 1.6, ALT 124, AST 111, Alk Phosphatase 200. CT abdomen shows moderate acute pancreatitis with edema of the adjacent gastric wall and borderline fatty liver. His acute hepatitis panel is negative.??  ??      Objective:     Vitals:    08/23/20 2034 08/24/20 0737 08/24/20 1104 08/24/20 1451   BP:  (!) 165/94 (!) 170/107 (!) 176/116   Pulse:  (!) 108 (!) 114 100   Resp: '18 18 17 16   ' Temp:  98.5 ??F (36.9 ??C) 97.5 ??F (36.4 ??C) 97.9 ??F (36.6 ??C)   SpO2:  97%  100%   Weight:       Height:            Intake and Output:  Current Shift: 06/29 0701 - 06/29 1900  In: -   Out: 420 [Urine:420]  Last three shifts: No  intake/output data recorded.    Physical Exam:   Skin:  Extremities and face reveal no rashes. No palmer erythema.   HEENT: Sclerae anicteric. Extra-occular muscles are intact. No abnormal pigmentation of the lips. The neck is supple.  Cardiovascular: Regular rate and rhythm.  Respiratory:  Comfortable breathing with no accessory muscle use.   GI:  Abdomen nondistended, soft, and  + tender. No enlargement of the liver or spleen. No masses palpable.  Rectal:  Deferred  Musculoskeletal: Extremities have good range of motion.  Neurological:  Gross memory appears intact.  Patient is alert and oriented.  Psychiatric:  Mood appears appropriate with judgement intact.  Lymphatic:  No visible adenopathy      Lab/Data Review:  Recent Results (from the past 24 hour(s))   GLUCOSE, POC    Collection Time: 08/23/20  6:30 PM   Result Value Ref Range    Glucose (POC) 168 (H) 65 - 117 mg/dL    Performed by [place], POC    Collection Time: 08/23/20  7:28 PM   Result Value Ref Range    Glucose (POC) 185 (H) 65 - 117 mg/dL    Performed by Cathlean Marseilles  GLUCOSE, POC    Collection Time: 08/24/20  7:20 AM   Result Value Ref Range    Glucose (POC) 228 (H) 65 - 117 mg/dL    Performed by Marshell Garfinkel    GLUCOSE, POC    Collection Time: 08/24/20 11:33 AM   Result Value Ref Range    Glucose (POC) 184 (H) 65 - 117 mg/dL    Performed by Ines Bloomer    METABOLIC PANEL, COMPREHENSIVE    Collection Time: 08/24/20 11:35 AM   Result Value Ref Range    Sodium 139 136 - 145 mmol/L    Potassium 4.1 3.5 - 5.1 mmol/L    Chloride 108 97 - 108 mmol/L    CO2 25 21 - 32 mmol/L    Anion gap 6 5 - 15 mmol/L    Glucose 214 (H) 65 - 100 mg/dL    BUN 4 (L) 6 - 20 mg/dL    Creatinine 1.10 0.70 - 1.30 mg/dL    BUN/Creatinine ratio 4 (L) 12 - 20      GFR est AA >60 >60 ml/min/1.82m    GFR est non-AA >60 >60 ml/min/1.754m   Calcium 8.9 8.5 - 10.1 mg/dL    Bilirubin, total 0.6 0.2 - 1.0 mg/dL    AST (SGOT) 22 15 - 37 U/L    ALT (SGPT) 45  12 - 78 U/L    Alk. phosphatase 176 (H) 45 - 117 U/L    Protein, total 7.3 6.4 - 8.2 g/dL    Albumin 3.0 (L) 3.5 - 5.0 g/dL    Globulin 4.3 (H) 2.0 - 4.0 g/dL    A-G Ratio 0.7 (L) 1.1 - 2.2     CBC WITH AUTOMATED DIFF    Collection Time: 08/24/20 11:35 AM   Result Value Ref Range    WBC 7.8 4.1 - 11.1 K/uL    RBC 4.52 4.10 - 5.70 M/uL    HGB 12.5 12.1 - 17.0 g/dL    HCT 38.8 36.6 - 50.3 %    MCV 85.8 80.0 - 99.0 FL    MCH 27.7 26.0 - 34.0 PG    MCHC 32.2 30.0 - 36.5 g/dL    RDW 15.7 (H) 11.5 - 14.5 %    PLATELET 384 150 - 400 K/uL    MPV 11.1 8.9 - 12.9 FL    NRBC 0.0 0.0 PER 100 WBC    ABSOLUTE NRBC 0.00 0.00 - 0.01 K/uL    NEUTROPHILS 54 32 - 75 %    LYMPHOCYTES 27 12 - 49 %    MONOCYTES 17 (H) 5 - 13 %    EOSINOPHILS 1 0 - 7 %    BASOPHILS 0 0 - 1 %    IMMATURE GRANULOCYTES 1 (H) 0 - 0.5 %    ABS. NEUTROPHILS 4.3 1.8 - 8.0 K/UL    ABS. LYMPHOCYTES 2.1 0.8 - 3.5 K/UL    ABS. MONOCYTES 1.3 (H) 0.0 - 1.0 K/UL    ABS. EOSINOPHILS 0.1 0.0 - 0.4 K/UL    ABS. BASOPHILS 0.0 0.0 - 0.1 K/UL    ABS. IMM. GRANS. 0.1 (H) 0.00 - 0.04 K/UL    DF AUTOMATED     LIPASE    Collection Time: 08/24/20 11:35 AM   Result Value Ref Range    Lipase 2,031 (H) 73 - 393 U/L              CT ABD PELV W CONT   Final Result   Stable pancreatitis.  [place] ABD LTD   Final Result      CT ABD PELV W CONT   Final Result   Moderate acute pancreatitis with edema of the adjacent gastric wall          Assessment:     Principal Problem:    Pancreatitis (08/18/2020)    Active Problems:    Acute pancreatitis (08/19/2020)      Acute cystitis with hematuria (08/20/2020)        Plan:   1. Pancreatitis  ??????clear Liquid diet  ??????Continue IV hydration  ??????Lipase in the am  ??????CT abdomen 6/23: Moderate acute pancreatitis with edema of the adjacent gastric wall      Repeat CT abdomen 6/27: stable pancreatitis. No evidence of pancreatic necrosis. Negative for abscess.       Ca 19-9 pending  ??2. Transaminitis??(trending down)  ??????????Hx of ETOH abuse  ??????????CT abdomen:  Borderline fatty liver  ??????????Negative Acute Hepatitis Panel  ??????????Continue CIWA protocol  3. Frequent Hiccups  ????????Baclofen 5 mg TID  4. Type 2 Diabetes  ??????Continue Home medications  ??????Continue bedside glucose monitoring    Thank you for allowing me to participate in this patients care.  Plan discussed with Dr. Karel Jarvis and he approves.    Signed By: Terrace Arabia, NP     August 24, 2020

## 2020-08-24 NOTE — Progress Notes (Signed)
DC Plan: Home    Cm will continue to follow.

## 2020-08-24 NOTE — Progress Notes (Signed)
16:20 blood pressure elevated 171/113  Hydralazine 10 mg iv given.  730 BP 162/89 Pulse 90. Patient resting quietly.

## 2020-08-24 NOTE — Progress Notes (Signed)
Problem: Falls - Risk of  Goal: *Absence of Falls  Description: Document Antonio Melendez Fall Risk and appropriate interventions in the flowsheet.  Outcome: Progressing Towards Goal  Note: Fall Risk Interventions:            Medication Interventions: Patient to call before getting OOB                   Problem: Pain  Goal: *Control of Pain  Outcome: Progressing Towards Goal     Problem: Diabetes Self-Management  Goal: *Disease process and treatment process  Description: Define diabetes and identify own type of diabetes; list 3 options for treating diabetes.  Outcome: Progressing Towards Goal  Goal: *Incorporating nutritional management into lifestyle  Description: Describe effect of type, amount and timing of food on blood glucose; list 3 methods for planning meals.  Outcome: Progressing Towards Goal  Goal: *Incorporating physical activity into lifestyle  Description: State effect of exercise on blood glucose levels.  Outcome: Progressing Towards Goal  Goal: *Developing strategies to promote health/change behavior  Description: Define the ABC's of diabetes; identify appropriate screenings, schedule and personal plan for screenings.  Outcome: Progressing Towards Goal  Goal: *Using medications safely  Description: State effect of diabetes medications on diabetes; name diabetes medication taking, action and side effects.  Outcome: Progressing Towards Goal  Goal: *Monitoring blood glucose, interpreting and using results  Description: Identify recommended blood glucose targets  and personal targets.  Outcome: Progressing Towards Goal  Goal: *Prevention, detection, treatment of acute complications  Description: List symptoms of hyper- and hypoglycemia; describe how to treat low blood sugar and actions for lowering  high blood glucose level.  Outcome: Progressing Towards Goal  Goal: *Prevention, detection and treatment of chronic complications  Description: Define the natural course of diabetes and describe the relationship of  blood glucose levels to long term complications of diabetes.  Outcome: Progressing Towards Goal  Goal: *Developing strategies to address psychosocial issues  Description: Describe feelings about living with diabetes; identify support needed and support network  Outcome: Progressing Towards Goal  Goal: *Insulin pump training  Outcome: Progressing Towards Goal  Goal: *Sick day guidelines  Outcome: Progressing Towards Goal  Goal: *Patient Specific Goal (EDIT GOAL, INSERT TEXT)  Outcome: Progressing Towards Goal     Problem: Patient Education: Go to Patient Education Activity  Goal: Patient/Family Education  Outcome: Progressing Towards Goal     Problem: Falls - Risk of  Goal: *Absence of Falls  Description: Document Antonio Melendez Fall Risk and appropriate interventions in the flowsheet.  Outcome: Progressing Towards Goal  Note: Fall Risk Interventions:            Medication Interventions: Patient to call before getting OOB                   Problem: Patient Education: Go to Patient Education Activity  Goal: Patient/Family Education  Outcome: Progressing Towards Goal     Problem: Discharge Planning  Goal: *Discharge to safe environment  Outcome: Progressing Towards Goal  Goal: *Knowledge of medication management  Outcome: Progressing Towards Goal     Problem: Patient Education: Go to Patient Education Activity  Goal: Patient/Family Education  Outcome: Progressing Towards Goal     Problem: Patient Education: Go to Patient Education Activity  Goal: Patient/Family Education  Outcome: Progressing Towards Goal     Problem: Pain  Goal: *Control of Pain  Outcome: Progressing Towards Goal  Goal: *PALLIATIVE CARE:  Alleviation of Pain  Outcome: Progressing Towards Goal  Problem: Patient Education: Go to Patient Education Activity  Goal: Patient/Family Education  Outcome: Progressing Towards Goal

## 2020-08-25 LAB — METABOLIC PANEL, COMPREHENSIVE
A-G Ratio: 0.6 — ABNORMAL LOW (ref 1.1–2.2)
ALT (SGPT): 38 U/L (ref 12–78)
AST (SGOT): 18 U/L (ref 15–37)
Albumin: 3 g/dL — ABNORMAL LOW (ref 3.5–5.0)
Alk. phosphatase: 169 U/L — ABNORMAL HIGH (ref 45–117)
Anion gap: 6 mmol/L (ref 5–15)
BUN/Creatinine ratio: 3 — ABNORMAL LOW (ref 12–20)
BUN: 3 mg/dL — ABNORMAL LOW (ref 6–20)
Bilirubin, total: 0.5 mg/dL (ref 0.2–1.0)
CO2: 27 mmol/L (ref 21–32)
Calcium: 9.3 mg/dL (ref 8.5–10.1)
Chloride: 103 mmol/L (ref 97–108)
Creatinine: 1.11 mg/dL (ref 0.70–1.30)
GFR est AA: >60 mL/min/{1.73_m2} (ref >60)
GFR est non-AA: >60 mL/min/{1.73_m2} (ref >60)
Globulin: 5.2 g/dL — ABNORMAL HIGH (ref 2.0–4.0)
Glucose: 205 mg/dL — ABNORMAL HIGH (ref 65–100)
Potassium: 3.7 mmol/L (ref 3.5–5.1)
Protein, total: 8.2 g/dL (ref 6.4–8.2)
Sodium: 136 mmol/L (ref 136–145)

## 2020-08-25 LAB — CBC WITH AUTOMATED DIFF
ABS. BASOPHILS: 0 10*3/uL (ref 0.0–0.1)
ABS. EOSINOPHILS: 0.1 10*3/uL (ref 0.0–0.4)
ABS. IMM. GRANS.: 0 10*3/uL (ref 0.00–0.04)
ABS. LYMPHOCYTES: 2.5 10*3/uL (ref 0.8–3.5)
ABS. MONOCYTES: 1.1 10*3/uL — ABNORMAL HIGH (ref 0.0–1.0)
ABS. NEUTROPHILS: 3.2 10*3/uL (ref 1.8–8.0)
ABSOLUTE NRBC: 0 10*3/uL (ref 0.00–0.01)
BASOPHILS: 0 % (ref 0–1)
EOSINOPHILS: 1 % (ref 0–7)
HCT: 39.1 % (ref 36.6–50.3)
HGB: 12.5 g/dL (ref 12.1–17.0)
IMMATURE GRANULOCYTES: 0 % (ref 0–0.5)
LYMPHOCYTES: 36 % (ref 12–49)
MCH: 27.4 PG (ref 26.0–34.0)
MCHC: 32 g/dL (ref 30.0–36.5)
MCV: 85.7 FL (ref 80.0–99.0)
MONOCYTES: 16 % — ABNORMAL HIGH (ref 5–13)
MPV: 11 FL (ref 8.9–12.9)
NEUTROPHILS: 47 % (ref 32–75)
NRBC: 0 PER 100 WBC
PLATELET: 469 10*3/uL — ABNORMAL HIGH (ref 150–400)
RBC: 4.56 M/uL (ref 4.10–5.70)
RDW: 15.6 % — ABNORMAL HIGH (ref 11.5–14.5)
WBC: 6.9 10*3/uL (ref 4.1–11.1)

## 2020-08-25 LAB — CANCER AG 19-9
CA 19-9: 27 U/mL (ref 0–35)
Carbohydrate Antigen 19-9, (CA 19-9): 27 U/mL (ref 0–35)

## 2020-08-25 LAB — GLUCOSE, POC
Glucose (POC): 259 mg/dL — ABNORMAL HIGH (ref 65–117)
Glucose (POC): 150 mg/dL — ABNORMAL HIGH (ref 65–117)
Glucose (POC): 320 mg/dL — ABNORMAL HIGH (ref 65–117)
Glucose (POC): 136 mg/dL — ABNORMAL HIGH (ref 65–117)

## 2020-08-25 LAB — LIPASE
Lipase: 1596 U/L — ABNORMAL HIGH (ref 73–393)
Lipase: 1596 U/L — ABNORMAL HIGH (ref 73–393)

## 2020-08-25 LAB — POCT GLUCOSE
POC Glucose: 259 mg/dL — ABNORMAL HIGH (ref 65–117)
POC Glucose: 150 mg/dL — ABNORMAL HIGH (ref 65–117)
POC Glucose: 320 mg/dL — ABNORMAL HIGH (ref 65–117)
POC Glucose: 136 mg/dL — ABNORMAL HIGH (ref 65–117)

## 2020-08-25 LAB — COMPREHENSIVE METABOLIC PANEL
ALT: 38 U/L (ref 12–78)
AST: 18 U/L (ref 15–37)
Albumin/Globulin Ratio: 0.6 — ABNORMAL LOW (ref 1.1–2.2)
Albumin: 3 g/dL — ABNORMAL LOW (ref 3.5–5.0)
Alkaline Phosphatase: 169 U/L — ABNORMAL HIGH (ref 45–117)
Anion Gap: 6 mmol/L (ref 5–15)
BUN: 3 mg/dL — ABNORMAL LOW (ref 6–20)
Bun/Cre Ratio: 3 — ABNORMAL LOW (ref 12–20)
CO2: 27 mmol/L (ref 21–32)
Calcium: 9.3 mg/dL (ref 8.5–10.1)
Chloride: 103 mmol/L (ref 97–108)
Creatinine: 1.11 mg/dL (ref 0.70–1.30)
EGFR IF NonAfrican American: >60 mL/min/{1.73_m2} (ref >60)
GFR African American: >60 mL/min/{1.73_m2} (ref >60)
Globulin: 5.2 g/dL — ABNORMAL HIGH (ref 2.0–4.0)
Glucose: 205 mg/dL — ABNORMAL HIGH (ref 65–100)
Potassium: 3.7 mmol/L (ref 3.5–5.1)
Sodium: 136 mmol/L (ref 136–145)
Total Bilirubin: 0.5 mg/dL (ref 0.2–1.0)
Total Protein: 8.2 g/dL (ref 6.4–8.2)

## 2020-08-25 LAB — CBC WITH AUTO DIFFERENTIAL
Basophils %: 0 % (ref 0–1)
Basophils Absolute: 0 10*3/uL (ref 0.0–0.1)
Eosinophils %: 1 % (ref 0–7)
Eosinophils Absolute: 0.1 10*3/uL (ref 0.0–0.4)
Granulocyte Absolute Count: 0 10*3/uL (ref 0.00–0.04)
Hematocrit: 39.1 % (ref 36.6–50.3)
Hemoglobin: 12.5 g/dL (ref 12.1–17.0)
Immature Granulocytes: 0 % (ref 0–0.5)
Lymphocytes %: 36 % (ref 12–49)
Lymphocytes Absolute: 2.5 10*3/uL (ref 0.8–3.5)
MCH: 27.4 PG (ref 26.0–34.0)
MCHC: 32 g/dL (ref 30.0–36.5)
MCV: 85.7 FL (ref 80.0–99.0)
MPV: 11 FL (ref 8.9–12.9)
Monocytes %: 16 % — ABNORMAL HIGH (ref 5–13)
Monocytes Absolute: 1.1 10*3/uL — ABNORMAL HIGH (ref 0.0–1.0)
NRBC Absolute: 0 10*3/uL (ref 0.00–0.01)
Neutrophils %: 47 % (ref 32–75)
Neutrophils Absolute: 3.2 10*3/uL (ref 1.8–8.0)
Nucleated RBCs: 0 PER 100 WBC
Platelets: 469 10*3/uL — ABNORMAL HIGH (ref 150–400)
RBC: 4.56 M/uL (ref 4.10–5.70)
RDW: 15.6 % — ABNORMAL HIGH (ref 11.5–14.5)
WBC: 6.9 10*3/uL (ref 4.1–11.1)

## 2020-08-25 MED ORDER — METOPROLOL TARTRATE 25 MG TAB
25 mg | Freq: Two times a day (BID) | ORAL | Status: DC
Start: 2020-08-25 — End: 2020-08-26
  Administered 2020-08-26 (×2): via ORAL

## 2020-08-25 MED FILL — BACLOFEN 10 MG TAB: 10 mg | ORAL | Qty: 1

## 2020-08-25 MED FILL — LISINOPRIL 40 MG TAB: 40 mg | ORAL | Qty: 1

## 2020-08-25 MED FILL — CHLORDIAZEPOXIDE 5 MG CAP: 5 mg | ORAL | Qty: 1

## 2020-08-25 MED FILL — OXYCODONE-ACETAMINOPHEN 5 MG-325 MG TAB: 5-325 mg | ORAL | Qty: 1

## 2020-08-25 MED FILL — METOPROLOL TARTRATE 25 MG TAB: 25 mg | ORAL | Qty: 1

## 2020-08-25 MED FILL — NS WITH POTASSIUM CHLORIDE 40 MEQ/L IV: 40 mEq/L | INTRAVENOUS | Qty: 1000

## 2020-08-25 MED FILL — HUMALOG U-100 INSULIN 100 UNIT/ML SUBCUTANEOUS SOLUTION: 100 unit/mL | SUBCUTANEOUS | Qty: 4

## 2020-08-25 MED FILL — ENOXAPARIN 40 MG/0.4 ML SUB-Q SYRINGE: 40 mg/0.4 mL | SUBCUTANEOUS | Qty: 0.4

## 2020-08-25 MED FILL — FAMOTIDINE (PF) 20 MG/2 ML IV: 20 mg/2 mL | INTRAVENOUS | Qty: 2

## 2020-08-25 MED FILL — HUMALOG U-100 INSULIN 100 UNIT/ML SUBCUTANEOUS SOLUTION: 100 unit/mL | SUBCUTANEOUS | Qty: 3

## 2020-08-25 MED FILL — POTASSIUM CHLORIDE SR 10 MEQ TAB: 10 mEq | ORAL | Qty: 4

## 2020-08-25 MED FILL — MEROPENEM 1 GRAM IV SOLR: 1 gram | INTRAVENOUS | Qty: 1

## 2020-08-25 MED FILL — HUMALOG U-100 INSULIN 100 UNIT/ML SUBCUTANEOUS SOLUTION: 100 unit/mL | SUBCUTANEOUS | Qty: 10

## 2020-08-25 MED FILL — HUMULIN N NPH U-100 INSULIN (ISOPHANE SUSP) 100 UNIT/ML SUBCUTANEOUS: 100 unit/mL | SUBCUTANEOUS | Qty: 17

## 2020-08-25 NOTE — Progress Notes (Signed)
Problem: Diabetes Self-Management  Goal: *Disease process and treatment process  Description: Define diabetes and identify own type of diabetes; list 3 options for treating diabetes.  Outcome: Progressing Towards Goal  Goal: *Incorporating nutritional management into lifestyle  Description: Describe effect of type, amount and timing of food on blood glucose; list 3 methods for planning meals.  Outcome: Progressing Towards Goal  Goal: *Incorporating physical activity into lifestyle  Description: State effect of exercise on blood glucose levels.  Outcome: Progressing Towards Goal  Goal: *Developing strategies to promote health/change behavior  Description: Define the ABC's of diabetes; identify appropriate screenings, schedule and personal plan for screenings.  Outcome: Progressing Towards Goal  Goal: *Using medications safely  Description: State effect of diabetes medications on diabetes; name diabetes medication taking, action and side effects.  Outcome: Progressing Towards Goal  Goal: *Monitoring blood glucose, interpreting and using results  Description: Identify recommended blood glucose targets  and personal targets.  Outcome: Progressing Towards Goal  Goal: *Prevention, detection, treatment of acute complications  Description: List symptoms of hyper- and hypoglycemia; describe how to treat low blood sugar and actions for lowering  high blood glucose level.  Outcome: Progressing Towards Goal  Goal: *Prevention, detection and treatment of chronic complications  Description: Define the natural course of diabetes and describe the relationship of blood glucose levels to long term complications of diabetes.  Outcome: Progressing Towards Goal  Goal: *Developing strategies to address psychosocial issues  Description: Describe feelings about living with diabetes; identify support needed and support network  Outcome: Progressing Towards Goal  Goal: *Insulin pump training  Outcome: Progressing Towards Goal  Goal: *Sick  day guidelines  Outcome: Progressing Towards Goal  Goal: *Patient Specific Goal (EDIT GOAL, INSERT TEXT)  Outcome: Progressing Towards Goal     Problem: Patient Education: Go to Patient Education Activity  Goal: Patient/Family Education  Outcome: Progressing Towards Goal     Problem: Falls - Risk of  Goal: *Absence of Falls  Description: Document Schmid Fall Risk and appropriate interventions in the flowsheet.  Outcome: Progressing Towards Goal  Note: Fall Risk Interventions:            Medication Interventions: Bed/chair exit alarm,Patient to call before getting OOB    Elimination Interventions: Call light in reach,Patient to call for help with toileting needs,Bed/chair exit alarm              Problem: Patient Education: Go to Patient Education Activity  Goal: Patient/Family Education  Outcome: Progressing Towards Goal     Problem: Discharge Planning  Goal: *Discharge to safe environment  Outcome: Progressing Towards Goal  Goal: *Knowledge of medication management  Outcome: Progressing Towards Goal     Problem: Patient Education: Go to Patient Education Activity  Goal: Patient/Family Education  Outcome: Progressing Towards Goal     Problem: Patient Education: Go to Patient Education Activity  Goal: Patient/Family Education  Outcome: Progressing Towards Goal     Problem: Pain  Goal: *Control of Pain  Outcome: Progressing Towards Goal  Goal: *PALLIATIVE CARE:  Alleviation of Pain  Outcome: Progressing Towards Goal     Problem: Patient Education: Go to Patient Education Activity  Goal: Patient/Family Education  Outcome: Progressing Towards Goal

## 2020-08-25 NOTE — Progress Notes (Signed)
Spiritual Care Assessment/Progress Note  [facility]      NAME: Antonio DIROCCO Sr.      MRN: [id number]  AGE: 60 y.o. SEX: male  Religious Affiliation: Baptist   Language: English     08/25/2020     Total Time (in minutes): 30     Spiritual Assessment begun in [facility] through conversation with:         [x] Patient        []  Family    [x]  Friend(s)        Reason for Consult: Initial/Spiritual assessment, patient floor     Spiritual beliefs: (Please include comment if needed)     [x]  Identifies with a faith tradition:         []  Supported by a faith community:            []  Claims no spiritual orientation:           []  Seeking spiritual identity:                []  Adheres to an individual form of spirituality:           []  Not able to assess:                           Identified resources for coping:      [x]  Prayer                               []  Music                  []  Guided Imagery     [x]  Family/friends                 []  Pet visits     []  Devotional reading                         []  Unknown     []  Other:                                              Interventions offered during this visit: (See comments for more details)    Patient Interventions: Affirmation of emotions/emotional suffering,Affirmation of faith,Catharsis/review of pertinent events in supportive environment,Coping skills reviewed/reinforced,Guidance concerning next steps/process to be expected,Initial/Spiritual assessment, patient floor,Iconic (affirming the presence of God/Higher Power),Prayer (assurance of),Religious beliefs/image of God discussed     Family/Friend(s): Affirmation of emotions/emotional suffering,Affirmation of faith,Catharsis/review of pertinent events in supportive environment,Coping skills reviewed/reinforced,Guidance concerning next steps/process to be expected,Initial Assessment,Iconic (affirming the presence of God/Higher Power),Prayer (assurance of)     Plan of Care:     []  Support  spiritual and/or cultural needs    []  Support AMD and/or advance care planning process      []  Support grieving process   []  Coordinate Rites and/or Rituals    []  Coordination with community clergy   []  No spiritual needs identified at this time   []  Detailed Plan of Care below (See Comments)  []  Make referral to Music Therapy  []  Make referral to Pet Therapy     []  Make referral to Addiction services  []  Make referral to [facility] Passages  []  Make referral to  Spiritual Care Partner  []  No future visits requested        [x]  Contact Spiritual Care for further referrals     Comments: The purpose of the visit was to do a spiritual assessment on the patient. The patient shared that he is so grateful for the care he's received from the med-team. He shared that he has a loving and caring family who encourages him. The patient shared that God is his guide, and he trust that God will continue to see him through. His fiancee shared how thankful she was to see his progress, and how hopeful his walking up the halls were. They mentioned how thankful they were to be cared for by the med-team, and how they felt genuinely care for. The chaplain listened empathically, facilitated story-telling, encouraged ongoing supportive presence of loved one, and provided the ministry of presence and the comfort of spiritual care.    Chaplain D.Min, M.Div.  Chaplain can be reached by calling the operator at [facility]  [phone]

## 2020-08-25 NOTE — Progress Notes (Signed)
Pt concerned about elevated BP, currently 181/102, Pulse 104  Metoprolol dose increased to 50 mg BID, continue on lisinopril at current dosing   Continue to closely monitor, adjust meds prn

## 2020-08-25 NOTE — Progress Notes (Signed)
 Comprehensive Nutrition Assessment    Type and Reason for Visit: (P) RD nutrition re-screen/LOS    Nutrition Recommendations/Plan:   Advance diet as medically able  Add ensure enlive 3x/day  Monitor and record PO intakes, supplement acceptance, and Bms in I/Os     Malnutrition Assessment:  Malnutrition Status:  Mild malnutrition (08/25/20 1154)    Context:  Acute illness     Findings of the 6 clinical characteristics of malnutrition:   Energy Intake:  75% or less of est energy req for 7 or more days  Weight Loss:  No significant weight loss     Body Fat Loss:  No significant body fat loss,     Muscle Mass Loss:  No significant muscle mass loss,    Fluid Accumulation:  No significant fluid accumulation,    Grip Strength:  Not performed     Nutrition Assessment:    (P) Admitted for pancreatitis, +abdominal pain, n/v/d. Upgraded to clears, tolerating, now upgraded to full today. Plan to add ONS. No weight loss per medical record. Labs: Na 136, K 3.7, BUN 3, Creat 1.11, Gluc 205, Alb 3.0. Meds: 0.9% NaCl, famotidine , insulin  lispro, insulin  NPH.    Nutrition Related Findings:    (P) Nourished per NFPE. n/v/d improving since admission. No problems chewing/swallowing. BM 6/28. No edema. Wound Type: (P) None    Current Nutrition Intake & Therapies:  Average Meal Intake: (P) 51-75%  Average Supplement Intake: (P) None ordered  ADULT DIET Full Liquid    Anthropometric Measures:  Height: (P) 5' 9.02 (175.3 cm)  Ideal Body Weight (IBW): (P) 160 lbs ((P) 73 kg)  Current Body Wt:  (P) 87.1 kg (192 lb 0.3 oz), (P) 120 % IBW. (P) Not specified  Current BMI (kg/m2): (P) 28.3  Weight Adjustment: (P) No adjustment  BMI Category: (P) Overweight (BMI 25.0-29.9)    Estimated Daily Nutrient Needs:  Energy Requirements Based On: (P) Kcal/kg  Weight Used for Energy Requirements: (P) Current  Energy (kcal/day): (P) 2178kcal (25kcal/kg)  Weight Used for Protein Requirements: (P) Current  Protein (g/day): (P) 87g (1g/kg)  Method Used for  Fluid Requirements: (P) 1 ml/kcal  Fluid (ml/day): (P) (66mL/kcal)    Nutrition Diagnosis:   (P) Inadequate protein-energy intake related to (P) altered GI function as evidenced by hx of NPO or clear liquid status due to medical condition    Nutrition Interventions:   Food and/or Nutrient Delivery: (P) Continue current diet,Start oral nutrition supplement  Nutrition Education/Counseling: (P) No recommendations at this time  Coordination of Nutrition Care: (P) Continue to monitor while inpatient,Feeding assistance/environmental change    Goals:  Goals: (P) Meet at least 75% of estimated needs,within 7 days    Nutrition Monitoring and Evaluation:   Behavioral-Environmental Outcomes: (P) None identified  Food/Nutrient Intake Outcomes: (P) Diet advancement/tolerance,Food and nutrient intake,Supplement intake  Physical Signs/Symptoms Outcomes: (P) Meal time behavior,Weight    Discharge Planning:    (P) Too soon to determine    Albino Child, RD  Contact: [phone]

## 2020-08-25 NOTE — Progress Notes (Signed)
Hospitalist Progress Note    Subjective:   Daily Progress Note: 08/25/2020 7:59 AM    Patient examined alert and oriented sitting on bedside, family present. He reports improvement in abdominal pain. No overnight events reported. No acute distress noted on examination.    Current Facility-Administered Medications   Medication Dose Route Frequency   ??? oxyCODONE-acetaminophen (PERCOCET) 5-325 mg per tablet 1 Tablet  1 Tablet Oral Q4H PRN   ??? [Held by provider] morphine injection 1 mg  1 mg IntraVENous Q4H PRN   ??? 0.9% sodium chloride with KCl 40 mEq/L infusion   IntraVENous CONTINUOUS   ??? meropenem (MERREM) 1 g in 0.9% sodium chloride (MBP/ADV) 50 mL MBP  1 g IntraVENous Q8H   ??? chlordiazePOXIDE (LIBRIUM) capsule 5 mg  5 mg Oral TID   ??? prochlorperazine (COMPAZINE) injection 5 mg  5 mg IntraVENous Q3H PRN   ??? baclofen (LIORESAL) tablet 5 mg  5 mg Oral TID   ??? famotidine (PF) (PEPCID) 20 mg in 0.9% sodium chloride 10 mL injection  20 mg IntraVENous Q12H   ??? hydrALAZINE (APRESOLINE) 20 mg/mL injection 10 mg  10 mg IntraVENous QID PRN   ??? sodium chloride (NS) flush 5-40 mL  5-40 mL IntraVENous Q8H   ??? sodium chloride (NS) flush 5-40 mL  5-40 mL IntraVENous PRN   ??? acetaminophen (TYLENOL) tablet 650 mg  650 mg Oral Q6H PRN    Or   ??? acetaminophen (TYLENOL) suppository 650 mg  650 mg Rectal Q6H PRN   ??? polyethylene glycol (MIRALAX) packet 17 g  17 g Oral DAILY PRN   ??? ondansetron (ZOFRAN ODT) tablet 4 mg  4 mg Oral Q8H PRN    Or   ??? ondansetron (ZOFRAN) injection 4 mg  4 mg IntraVENous Q6H PRN   ??? enoxaparin (LOVENOX) injection 40 mg  40 mg SubCUTAneous DAILY   ??? lisinopriL (PRINIVIL, ZESTRIL) tablet 40 mg  40 mg Oral DAILY   ??? metoprolol tartrate (LOPRESSOR) tablet 25 mg  25 mg Oral BID   ??? LORazepam (ATIVAN) injection 1 mg  1 mg IntraVENous Q2H PRN   ??? glucose chewable tablet 16 g  4 Tablet Oral PRN   ??? dextrose 10% infusion 0-250 mL  0-250 mL IntraVENous PRN   ??? glucagon (GLUCAGEN) injection 1 mg  1 mg IntraMUSCular PRN    ??? insulin NPH (NOVOLIN N, HUMULIN N) injection 17 Units  0.2 Units/kg SubCUTAneous ACB&D   ??? insulin lispro (HUMALOG) injection   SubCUTAneous AC&HS        Review of Systems  Review of Systems   Constitutional: Negative.    Respiratory: Negative.    Cardiovascular: Negative.    Gastrointestinal: Positive for abdominal pain. Negative for nausea and vomiting.   Neurological: Negative for dizziness, tremors and headaches.   All other systems reviewed and are negative.       Objective:     Visit Vitals  BP (!) 148/98 (BP 1 Location: Left upper arm)   Pulse (!) 114   Temp 97.8 ??F (36.6 ??C)   Resp 20   Ht 5' 9.02" (1.753 m)   Wt 87.1 kg (192 lb)   SpO2 100%   BMI 28.34 kg/m??      O2 Device: None (Room air)    Temp (24hrs), Avg:97.8 ??F (36.6 ??C), Min:97.7 ??F (36.5 ??C), Max:97.9 ??F (36.6 ??C)      No intake/output data recorded.  06/28 1901 - 06/30 0700  In: -   Out:  420 [Urine:420]    Recent Results (from the past 24 hour(s))   GLUCOSE, POC    Collection Time: 08/24/20  4:19 PM   Result Value Ref Range    Glucose (POC) 318 (H) 65 - 117 mg/dL    Performed by Ines Bloomer    GLUCOSE, POC    Collection Time: 08/24/20  8:24 PM   Result Value Ref Range    Glucose (POC) 259 (H) 65 - 117 mg/dL    Performed by Cathlean Marseilles    METABOLIC PANEL, COMPREHENSIVE    Collection Time: 08/25/20  8:18 AM   Result Value Ref Range    Sodium 136 136 - 145 mmol/L    Potassium 3.7 3.5 - 5.1 mmol/L    Chloride 103 97 - 108 mmol/L    CO2 27 21 - 32 mmol/L    Anion gap 6 5 - 15 mmol/L    Glucose 205 (H) 65 - 100 mg/dL    BUN 3 (L) 6 - 20 mg/dL    Creatinine 1.11 0.70 - 1.30 mg/dL    BUN/Creatinine ratio 3 (L) 12 - 20      GFR est AA >60 >60 ml/min/1.61m    GFR est non-AA >60 >60 ml/min/1.743m   Calcium 9.3 8.5 - 10.1 mg/dL    Bilirubin, total 0.5 0.2 - 1.0 mg/dL    AST (SGOT) 18 15 - 37 U/L    ALT (SGPT) 38 12 - 78 U/L    Alk. phosphatase 169 (H) 45 - 117 U/L    Protein, total 8.2 6.4 - 8.2 g/dL    Albumin 3.0 (L) 3.5 - 5.0 g/dL    Globulin  5.2 (H) 2.0 - 4.0 g/dL    A-G Ratio 0.6 (L) 1.1 - 2.2     CBC WITH AUTOMATED DIFF    Collection Time: 08/25/20  8:18 AM   Result Value Ref Range    WBC 6.9 4.1 - 11.1 K/uL    RBC 4.56 4.10 - 5.70 M/uL    HGB 12.5 12.1 - 17.0 g/dL    HCT 39.1 36.6 - 50.3 %    MCV 85.7 80.0 - 99.0 FL    MCH 27.4 26.0 - 34.0 PG    MCHC 32.0 30.0 - 36.5 g/dL    RDW 15.6 (H) 11.5 - 14.5 %    PLATELET 469 (H) 150 - 400 K/uL    MPV 11.0 8.9 - 12.9 FL    NRBC 0.0 0.0 PER 100 WBC    ABSOLUTE NRBC 0.00 0.00 - 0.01 K/uL    NEUTROPHILS 47 32 - 75 %    LYMPHOCYTES 36 12 - 49 %    MONOCYTES 16 (H) 5 - 13 %    EOSINOPHILS 1 0 - 7 %    BASOPHILS 0 0 - 1 %    IMMATURE GRANULOCYTES 0 0 - 0.5 %    ABS. NEUTROPHILS 3.2 1.8 - 8.0 K/UL    ABS. LYMPHOCYTES 2.5 0.8 - 3.5 K/UL    ABS. MONOCYTES 1.1 (H) 0.0 - 1.0 K/UL    ABS. EOSINOPHILS 0.1 0.0 - 0.4 K/UL    ABS. BASOPHILS 0.0 0.0 - 0.1 K/UL    ABS. IMM. GRANS. 0.0 0.00 - 0.04 K/UL    DF AUTOMATED     LIPASE    Collection Time: 08/25/20  8:18 AM   Result Value Ref Range    Lipase 1,596 (H) 73 - 393 U/L   GLUCOSE, POC    Collection Time:  08/25/20  8:18 AM   Result Value Ref Range    Glucose (POC) 150 (H) 65 - 117 mg/dL    Performed by Ines Bloomer    GLUCOSE, POC    Collection Time: 08/25/20 12:01 PM   Result Value Ref Range    Glucose (POC) 320 (H) 65 - 117 mg/dL    Performed by Nathaneil Canary         CT ABD PELV W CONT   Final Result   Stable pancreatitis.         [place] ABD LTD   Final Result      CT ABD PELV W CONT   Final Result   Moderate acute pancreatitis with edema of the adjacent gastric wall           PHYSICAL EXAM:    Physical Exam  Vitals and nursing note reviewed.   Constitutional:       Appearance: He is obese.   HENT:      Head: Normocephalic and atraumatic.   Eyes:      General: Scleral icterus present.   Cardiovascular:      Rate and Rhythm: Normal rate and regular rhythm.   Pulmonary:      Effort: No respiratory distress.      Breath sounds: No wheezing.   Abdominal:      General:  Bowel sounds are normal. There is no distension.      Palpations: Abdomen is soft.      Comments: Negative for cullen or turners sign    Genitourinary:     Comments: No foley present  Musculoskeletal:      Right lower leg: No edema.      Left lower leg: No edema.   Skin:     General: Skin is warm.   Neurological:      Mental Status: He is alert and oriented to person, place, and time.   Psychiatric:         Mood and Affect: Mood normal.          Data Review    Recent Results (from the past 24 hour(s))   GLUCOSE, POC    Collection Time: 08/24/20  4:19 PM   Result Value Ref Range    Glucose (POC) 318 (H) 65 - 117 mg/dL    Performed by Ines Bloomer    GLUCOSE, POC    Collection Time: 08/24/20  8:24 PM   Result Value Ref Range    Glucose (POC) 259 (H) 65 - 117 mg/dL    Performed by Cathlean Marseilles    METABOLIC PANEL, COMPREHENSIVE    Collection Time: 08/25/20  8:18 AM   Result Value Ref Range    Sodium 136 136 - 145 mmol/L    Potassium 3.7 3.5 - 5.1 mmol/L    Chloride 103 97 - 108 mmol/L    CO2 27 21 - 32 mmol/L    Anion gap 6 5 - 15 mmol/L    Glucose 205 (H) 65 - 100 mg/dL    BUN 3 (L) 6 - 20 mg/dL    Creatinine 1.11 0.70 - 1.30 mg/dL    BUN/Creatinine ratio 3 (L) 12 - 20      GFR est AA >60 >60 ml/min/1.46m    GFR est non-AA >60 >60 ml/min/1.749m   Calcium 9.3 8.5 - 10.1 mg/dL    Bilirubin, total 0.5 0.2 - 1.0 mg/dL    AST (SGOT) 18 15 - 37 U/L    ALT (SGPT)  38 12 - 78 U/L    Alk. phosphatase 169 (H) 45 - 117 U/L    Protein, total 8.2 6.4 - 8.2 g/dL    Albumin 3.0 (L) 3.5 - 5.0 g/dL    Globulin 5.2 (H) 2.0 - 4.0 g/dL    A-G Ratio 0.6 (L) 1.1 - 2.2     CBC WITH AUTOMATED DIFF    Collection Time: 08/25/20  8:18 AM   Result Value Ref Range    WBC 6.9 4.1 - 11.1 K/uL    RBC 4.56 4.10 - 5.70 M/uL    HGB 12.5 12.1 - 17.0 g/dL    HCT 39.1 36.6 - 50.3 %    MCV 85.7 80.0 - 99.0 FL    MCH 27.4 26.0 - 34.0 PG    MCHC 32.0 30.0 - 36.5 g/dL    RDW 15.6 (H) 11.5 - 14.5 %    PLATELET 469 (H) 150 - 400 K/uL    MPV 11.0 8.9 - 12.9  FL    NRBC 0.0 0.0 PER 100 WBC    ABSOLUTE NRBC 0.00 0.00 - 0.01 K/uL    NEUTROPHILS 47 32 - 75 %    LYMPHOCYTES 36 12 - 49 %    MONOCYTES 16 (H) 5 - 13 %    EOSINOPHILS 1 0 - 7 %    BASOPHILS 0 0 - 1 %    IMMATURE GRANULOCYTES 0 0 - 0.5 %    ABS. NEUTROPHILS 3.2 1.8 - 8.0 K/UL    ABS. LYMPHOCYTES 2.5 0.8 - 3.5 K/UL    ABS. MONOCYTES 1.1 (H) 0.0 - 1.0 K/UL    ABS. EOSINOPHILS 0.1 0.0 - 0.4 K/UL    ABS. BASOPHILS 0.0 0.0 - 0.1 K/UL    ABS. IMM. GRANS. 0.0 0.00 - 0.04 K/UL    DF AUTOMATED     LIPASE    Collection Time: 08/25/20  8:18 AM   Result Value Ref Range    Lipase 1,596 (H) 73 - 393 U/L   GLUCOSE, POC    Collection Time: 08/25/20  8:18 AM   Result Value Ref Range    Glucose (POC) 150 (H) 65 - 117 mg/dL    Performed by Ines Bloomer    GLUCOSE, POC    Collection Time: 08/25/20 12:01 PM   Result Value Ref Range    Glucose (POC) 320 (H) 65 - 117 mg/dL    Performed by Nathaneil Canary         Assessment/Plan:     Principal Problem:    Pancreatitis (08/18/2020)    Active Problems:    Acute pancreatitis (08/19/2020)      Acute cystitis with hematuria (08/20/2020)        Hospital Course:    Antonio USMAN Sr. is a 60 y.o. male with a PMH of diabetes, hypertension, alcohol abuse, and hyperlipidemia who presented to the freestanding ED with abdominal pain. In FSED tachycardic and hypertensive. Initial labs significant for WBC of 12.5, sodium of 126, glucose of 273, bilirubin of 1.9, ALT of 241, AST of 347, alk phosphatase of 300, lipase of 1093, lactate of 4.6, and BNP of 541. Urinalysis with proteinuria, glucosuria, ketonuria, hematuria, pyuria and leuk esterase with bacteria. CT of the abdomen showed moderate acute pancreatitis with edema of the adjacent gastric wall. Patient admitted for further work-up. Patient started on IV Pepcid, fluids, Zosyn, and pain control. Blood culture pending. GI and general surgery consulted. General surgery deemed not a surgical candidate at this time.  Patient WBC, CRP, and  procalcitonin elevated. ID consulted. Will add vancomycin for empiric coverage. No growth to date with blood culture we will discontinue vancomycin. Urine culture grew E Coli. Repeat CT abd/pelv shows stable pancreatitis.    Pancreatitis secondary to etoh abuse  Lipase 1093>697>424>1045-->2320-->2021-->2664-->2031-->1596  Continue on IVF and pain control  Advance diet per GI: FULL  GI and general surgery following  Repeat CT abd/pelv shows stable pancreatitis.  ??  Sepsis, POA with tachycardia, WBC elevated and lactic  Down-trending inflammatory markers, trend  Continue on zosyn and IVF  No growth to date with blood culture we will discontinue vancomycin  6/23 blood: NGTD, prelim  ID following  ??  Urinary tract infection  6/23 urine: E Coli  Zosyn dc'd 6/27, merrem initiated  ??  Elevated transamidase  Hepatitis panel non-reactive  ??  Diabetes Mellitus, type II  A1c 11.6  Continue 17 units of NPH  SSI as needed  ??  Hypertension  BP currently at goal  Continue on metoprolol and lisinopril  Prn hydralazine  ??  Hyperlipidemia  Hold pravastatin  ??  Alcohol use  Continue on librium, wean as tolerated  CIWA protocol in place    Hypokalemia/Hypomagnesemia  K 2.8  Replenish with PO and IV K  Replenish with IV mag 2gm   Am labs pending  ??    DVT Prophylaxis: lovenox  GI Prophylaxis: pepcid  Discharge and disposition barriers:   clinical improvement  24 - 48 hours  Improvement in lipase  Tolerating PO intake      Above treatment plan and diagnostic results discussed with patient in detail a t bedside, all questions answered.     Care Plan discussed with: Interdisciplinary team    Total time spent with patient: 35 minutes.

## 2020-08-25 NOTE — Progress Notes (Signed)
Bedside shift change report given to Abe People RN (oncoming nurse) by Alferd Patee (offgoing nurse). Report included the following information SBAR, Kardex, Intake/Output, MAR and Recent Resultspatient sitting up at edge of bed, company at bedside. patient ambulated hallway periodically during the shift.Marland Kitchen

## 2020-08-25 NOTE — Progress Notes (Signed)
Progress Note    Patient: Antonio MEHRINGER Sr. MRN: [id number]  SSN: [id number]    Date of Birth: [birth year]  Age: 60 y.o.  Sex: male      Admit Date: 08/18/2020    LOS: 6 days     Subjective:   GI in consultation for Pancreatitis    6/30: Patient states his abdominal pain is improving. His lipase is trending down to 1,596 this am. He is on a full liquid diet and tolerating without difficulty. Family at the bedside. Repeat CT of abdomen on 6/27 showing stable pancreatitis. No evidence of pancreatic necrosis. Negative for abscess. He is on IV merrem.  ??  History of Present Illness:??Antonio Bast Sr.??is a 60 y.o.??male??who is seen in consultation for Pancreatitis. Mr. Tennis presented to the ED with complaints of abdominal pain, nausea, and vomiting. He reports his abdominal pain is on and off. He reports he has been drinking about a 12 pack of beer or bottles of alcohol nearly every day for the past 3 weeks. He denies fever, hematemesis, or hemoptysis. He denies tobacco use.His last alcoholic drink was on Tuesday. He does have abdominal tenderness with light palpation. He reports frequent hiccups. He has a past medical history significant for hypertension, type 2 diabetes, insulin dependent, and hyperlipidemia. His lipase on admission was over 1,000 currently it is 424. He is NPO with IV hydration. Potassium low at 2.9 this morning, he was given IV potassium. Total bilirubin 1.6, ALT 124, AST 111, Alk Phosphatase 200. CT abdomen shows moderate acute pancreatitis with edema of the adjacent gastric wall and borderline fatty liver. His acute hepatitis panel is negative.??  ??      Objective:     Vitals:    08/24/20 2357 08/25/20 0854 08/25/20 1154 08/25/20 1209   BP: (!) 161/101 (!) 167/110  (!) 148/98   Pulse: 86 (!) 114  (!) 114   Resp: '16 20  20   ' Temp: 97.7 ??F (36.5 ??C) 97.8 ??F (36.6 ??C)     SpO2: 100% 100%     Weight:       Height:   5' 9.02" (1.753 m)         Intake and Output:  Current Shift: No intake/output  data recorded.  Last three shifts: 06/28 1901 - 06/30 0700  In: -   Out: 420 [Urine:420]    Physical Exam:   Skin:  Extremities and face reveal no rashes. No palmer erythema.   HEENT: Sclerae anicteric. Extra-occular muscles are intact. No abnormal pigmentation of the lips. The neck is supple.  Cardiovascular: Regular rate and rhythm.  Respiratory:  Comfortable breathing with no accessory muscle use.   GI:  Abdomen nondistended, soft, and nontender. No enlargement of the liver or spleen. No masses palpable.  Rectal:  Deferred  Musculoskeletal: Extremities have good range of motion.  Neurological:  Gross memory appears intact.  Patient is alert and oriented.  Psychiatric:  Mood appears appropriate with judgement intact.  Lymphatic:  No visible adenopathy      Lab/Data Review:  Recent Results (from the past 24 hour(s))   GLUCOSE, POC    Collection Time: 08/24/20  4:19 PM   Result Value Ref Range    Glucose (POC) 318 (H) 65 - 117 mg/dL    Performed by Ines Bloomer    GLUCOSE, POC    Collection Time: 08/24/20  8:24 PM   Result Value Ref Range    Glucose (POC) 259 (H) 65 - 117 mg/dL  Performed by Cathlean Marseilles    METABOLIC PANEL, COMPREHENSIVE    Collection Time: 08/25/20  8:18 AM   Result Value Ref Range    Sodium 136 136 - 145 mmol/L    Potassium 3.7 3.5 - 5.1 mmol/L    Chloride 103 97 - 108 mmol/L    CO2 27 21 - 32 mmol/L    Anion gap 6 5 - 15 mmol/L    Glucose 205 (H) 65 - 100 mg/dL    BUN 3 (L) 6 - 20 mg/dL    Creatinine 1.11 0.70 - 1.30 mg/dL    BUN/Creatinine ratio 3 (L) 12 - 20      GFR est AA >60 >60 ml/min/1.71m    GFR est non-AA >60 >60 ml/min/1.772m   Calcium 9.3 8.5 - 10.1 mg/dL    Bilirubin, total 0.5 0.2 - 1.0 mg/dL    AST (SGOT) 18 15 - 37 U/L    ALT (SGPT) 38 12 - 78 U/L    Alk. phosphatase 169 (H) 45 - 117 U/L    Protein, total 8.2 6.4 - 8.2 g/dL    Albumin 3.0 (L) 3.5 - 5.0 g/dL    Globulin 5.2 (H) 2.0 - 4.0 g/dL    A-G Ratio 0.6 (L) 1.1 - 2.2     CBC WITH AUTOMATED DIFF    Collection Time:  08/25/20  8:18 AM   Result Value Ref Range    WBC 6.9 4.1 - 11.1 K/uL    RBC 4.56 4.10 - 5.70 M/uL    HGB 12.5 12.1 - 17.0 g/dL    HCT 39.1 36.6 - 50.3 %    MCV 85.7 80.0 - 99.0 FL    MCH 27.4 26.0 - 34.0 PG    MCHC 32.0 30.0 - 36.5 g/dL    RDW 15.6 (H) 11.5 - 14.5 %    PLATELET 469 (H) 150 - 400 K/uL    MPV 11.0 8.9 - 12.9 FL    NRBC 0.0 0.0 PER 100 WBC    ABSOLUTE NRBC 0.00 0.00 - 0.01 K/uL    NEUTROPHILS 47 32 - 75 %    LYMPHOCYTES 36 12 - 49 %    MONOCYTES 16 (H) 5 - 13 %    EOSINOPHILS 1 0 - 7 %    BASOPHILS 0 0 - 1 %    IMMATURE GRANULOCYTES 0 0 - 0.5 %    ABS. NEUTROPHILS 3.2 1.8 - 8.0 K/UL    ABS. LYMPHOCYTES 2.5 0.8 - 3.5 K/UL    ABS. MONOCYTES 1.1 (H) 0.0 - 1.0 K/UL    ABS. EOSINOPHILS 0.1 0.0 - 0.4 K/UL    ABS. BASOPHILS 0.0 0.0 - 0.1 K/UL    ABS. IMM. GRANS. 0.0 0.00 - 0.04 K/UL    DF AUTOMATED     LIPASE    Collection Time: 08/25/20  8:18 AM   Result Value Ref Range    Lipase 1,596 (H) 73 - 393 U/L   GLUCOSE, POC    Collection Time: 08/25/20  8:18 AM   Result Value Ref Range    Glucose (POC) 150 (H) 65 - 117 mg/dL    Performed by FaInes Bloomer  GLUCOSE, POC    Collection Time: 08/25/20 12:01 PM   Result Value Ref Range    Glucose (POC) 320 (H) 65 - 117 mg/dL    Performed by WaNathaneil Canary             CT ABD PELV W  CONT   Final Result   Stable pancreatitis.         [place] ABD LTD   Final Result      CT ABD PELV W CONT   Final Result   Moderate acute pancreatitis with edema of the adjacent gastric wall          Assessment:     Principal Problem:    Pancreatitis (08/18/2020)    Active Problems:    Acute pancreatitis (08/19/2020)      Acute cystitis with hematuria (08/20/2020)        Plan:   1. Pancreatitis  ??????Full Liquid diet  ??????Continue IV hydration  ??????Lipase in the am  ??????CT abdomen 6/23: Moderate acute pancreatitis with edema of the adjacent gastric wall  ????????Repeat CT abdomen 6/27: stable pancreatitis. No evidence of pancreatic necrosis. Negative for abscess.   ????????Ca 19-9 27 (normal)  ??2.  Transaminitis??(trending down)  ??????????Hx of ETOH abuse  ??????????CT abdomen: Borderline fatty liver  ??????????Negative Acute Hepatitis Panel  ??????????Continue CIWA protocol  3. Frequent Hiccups  ????????Baclofen 5 mg TID  4. Type 2 Diabetes  ??????Continue Home medications  ??????Continue bedside glucose monitoring      Thank you for allowing me to participate in this patients care.   Plan discussed with Dr. Karel Jarvis and he approves.    Signed By: Terrace Arabia, NP     August 25, 2020

## 2020-08-25 NOTE — Progress Notes (Signed)
Problem: Diabetes Self-Management  Goal: *Disease process and treatment process  Description: Define diabetes and identify own type of diabetes; list 3 options for treating diabetes.  Outcome: Progressing Towards Goal  Goal: *Incorporating nutritional management into lifestyle  Description: Describe effect of type, amount and timing of food on blood glucose; list 3 methods for planning meals.  Outcome: Progressing Towards Goal  Goal: *Monitoring blood glucose, interpreting and using results  Description: Identify recommended blood glucose targets  and personal targets.  Outcome: Progressing Towards Goal  Goal: *Prevention, detection, treatment of acute complications  Description: List symptoms of hyper- and hypoglycemia; describe how to treat low blood sugar and actions for lowering  high blood glucose level.  Outcome: Progressing Towards Goal  Goal: *Developing strategies to address psychosocial issues  Description: Describe feelings about living with diabetes; identify support needed and support network  Outcome: Progressing Towards Goal

## 2020-08-25 NOTE — Progress Notes (Signed)
Progress Notes by Jene Every, MD at 08/25/20 1610                Author: Jene Every, MD  Service: MEDICINE  Author Type: Physician       Filed: 08/25/20 1623  Date of Service: 08/25/20 1610  Status: Signed          Editor: Jene Every, MD (Physician)                                    Infectious Disease Progress Note               Subjective:     Stable, feels better, abdominal pain improved, will like to be discharged home ASAP, hoping for 07/01     Objective:     Physical Exam:       Visit Vitals      BP  (!) 148/98 (BP 1 Location: Left upper arm)     Pulse  (!) 114     Temp  97.8 ??F (36.6 ??C)     Resp  20     Ht  5' 9.02" (1.753 m)     Wt  192 lb (87.1 kg)     SpO2  100%        BMI  28.34 kg/m??        O2 Device:  None (Room air)      Temp (24hrs), Avg:97.8 ??F (36.6 ??C), Min:97.7 ??F (36.5 ??C), Max:97.8 ??F (36.6 ??C)     No intake/output data recorded.    06/28 1901 - 06/30 0700   In: -    Out: 420 [Urine:420]      General: NAD, alert, AAO x 4   HEENT: PERLA, Moist mucosa    Lungs: CTA b/l, decreased at the bases    Heart: S1S2+, RRR, no murmur   Abdo: Soft, Decreased LUQ pain on deep palpation    GU: no foley cath    Exts: No edema, + pulses b/l    Skin: No wounds, No rashes or lesions           Data Review:          Recent Days:     Recent Labs             08/25/20   0818  08/24/20   1135  08/23/20   0815     WBC  6.9  7.8  6.3     HGB  12.5  12.5  11.4*     HCT  39.1  38.8  34.4*          PLT  469*  384  268          Recent Labs             08/25/20   0818  08/24/20   1135  08/23/20   0815     BUN  3*  4*  2*          CREA  1.11  1.10  1.06             Lab Results         Component  Value  Date/Time            C-Reactive protein  7.38 (H)  08/23/2020 08:15 AM            Microbiology  Results               Procedure  Component  Value  Units  Date/Time           CULTURE, URINE [[id number]]  (Abnormal)  (Susceptibility)  Collected: 08/18/20 1209            Order Status: Completed   Specimen: Urine  Updated: 08/20/20 1317                Special Requests:  No Special Requests              Colony Count  --                  >100,000   colonies/ml                   Culture result:  Escherichia coli                 Susceptibility           Escherichia coli          MIC          Amikacin ($)  Susceptible          Ampicillin ($)  Susceptible          Ampicillin/sulbactam ($)  Susceptible          Cefazolin ($)  Susceptible          Cefepime ($$)  Susceptible          Cefoxitin  Susceptible          Ceftazidime ($)  Susceptible          Ceftriaxone ($)  Susceptible          Ciprofloxacin ($)  Susceptible          Gentamicin ($)  Susceptible          Levofloxacin ($)  Susceptible          Meropenem ($$)  Susceptible          Nitrofurantoin  Susceptible          Piperacillin/Tazobac ($)  Susceptible          Tobramycin ($)  Susceptible          Trimeth/Sulfa  Susceptible                                   Linear View                                            CULTURE, BLOOD, PAIRED [[id number]]  Collected: 08/18/20 1119            Order Status: Completed  Specimen: Blood  Updated: 08/24/20 0915                Special Requests:  No Special Requests                     Culture result:  No growth 6 days                         Diagnostics      CXR Results   (Last 48 hours)          None  CT abdo/pel w contrast 06/27: Stable findings of pancreatitis with peripancreatic fluid and edema with   extension into the transverse mesocolon. There is no evidence of pancreatic   necrosis. Negative for abscess. There is reactive wall thickening of the distal   duodenum and proximal jejunum. There is no evidence of bowel obstruction   Trace free fluid noted. The appendix is normal. Urinary bladder is empty.   Prostate gland and seminal vesicles are unremarkable.??   No suspicious lytic or blastic skeletal lesion apparent.        Assessment/Plan        1. Acute severe pancreatitis, alcohol associated,  abdominal pain is better        No abscess or pancreatic necrosis on repeat CT (06/27)       WBC remains WNLs, lipase trending down, abdominal pain is better        On day # 4 of Meropenem, since no abscess on CT no indication for IV abx       Reasonable to d/c on levaquin 750 mg daily x 7 days if discharged on 07/01       Routine labs in the morning. Will need close f/u upon d/c   ??   2. UTI, Pansenstive E.coli isolated from Cx, S/p appropriate tx    ??   3. Alcoholism, due to depression per pt, counseled    ??   4. Abdominal pain due to # 1, continue symptomatic mgt       Jene Every, MD      08/25/2020

## 2020-08-26 LAB — GLUCOSE, POC
Glucose (POC): 209 mg/dL — ABNORMAL HIGH (ref 65–117)
Glucose (POC): 261 mg/dL — ABNORMAL HIGH (ref 65–117)
Glucose (POC): 331 mg/dL — ABNORMAL HIGH (ref 65–117)

## 2020-08-26 LAB — LIPASE
Lipase: 1744 U/L — ABNORMAL HIGH (ref 73–393)
Lipase: 1744 U/L — ABNORMAL HIGH (ref 73–393)

## 2020-08-26 LAB — POCT GLUCOSE
POC Glucose: 209 mg/dL — ABNORMAL HIGH (ref 65–117)
POC Glucose: 261 mg/dL — ABNORMAL HIGH (ref 65–117)
POC Glucose: 331 mg/dL — ABNORMAL HIGH (ref 65–117)

## 2020-08-26 MED ORDER — AMLODIPINE 10 MG TAB
10 mg | ORAL_TABLET | Freq: Every day | ORAL | 3 refills | Status: AC
Start: 2020-08-26 — End: ?

## 2020-08-26 MED FILL — HUMALOG U-100 INSULIN 100 UNIT/ML SUBCUTANEOUS SOLUTION: 100 unit/mL | SUBCUTANEOUS | Qty: 4

## 2020-08-26 MED FILL — MEROPENEM 1 GRAM IV SOLR: 1 gram | INTRAVENOUS | Qty: 1

## 2020-08-26 MED FILL — CHLORDIAZEPOXIDE 5 MG CAP: 5 mg | ORAL | Qty: 1

## 2020-08-26 MED FILL — NS WITH POTASSIUM CHLORIDE 40 MEQ/L IV: 40 mEq/L | INTRAVENOUS | Qty: 1000

## 2020-08-26 MED FILL — OXYCODONE-ACETAMINOPHEN 5 MG-325 MG TAB: 5-325 mg | ORAL | Qty: 1

## 2020-08-26 MED FILL — HUMALOG U-100 INSULIN 100 UNIT/ML SUBCUTANEOUS SOLUTION: 100 unit/mL | SUBCUTANEOUS | Qty: 5

## 2020-08-26 MED FILL — METOPROLOL TARTRATE 25 MG TAB: 25 mg | ORAL | Qty: 2

## 2020-08-26 MED FILL — ENOXAPARIN 40 MG/0.4 ML SUB-Q SYRINGE: 40 mg/0.4 mL | SUBCUTANEOUS | Qty: 0.4

## 2020-08-26 MED FILL — FAMOTIDINE (PF) 20 MG/2 ML IV: 20 mg/2 mL | INTRAVENOUS | Qty: 2

## 2020-08-26 MED FILL — LISINOPRIL 40 MG TAB: 40 mg | ORAL | Qty: 1

## 2020-08-26 MED FILL — HUMULIN N NPH U-100 INSULIN (ISOPHANE SUSP) 100 UNIT/ML SUBCUTANEOUS: 100 unit/mL | SUBCUTANEOUS | Qty: 17

## 2020-08-26 MED FILL — BACLOFEN 10 MG TAB: 10 mg | ORAL | Qty: 1

## 2020-08-26 NOTE — Discharge Summary (Signed)
Admit date: 08/18/2020   Admitting Provider: Otho Najjar, MD    Discharge date: 09/20/2020  Discharging Provider: Vira Browns, NP      * Admission Diagnoses: Pancreatitis [K85.90]  Acute pancreatitis [K85.90]    * Discharge Diagnoses:    Hospital Problems as of 08/26/2020 Never Reviewed            Codes Class Noted - Resolved POA    Acute cystitis with hematuria ICD-10-CM: N30.01  ICD-9-CM: 595.0  08/20/2020 - Present Unknown        Acute pancreatitis ICD-10-CM: K85.90  ICD-9-CM: 924.2  08/19/2020 - Present Unknown        * (Principal) Pancreatitis ICD-10-CM: K85.90  ICD-9-CM: 683.4  08/18/2020 - Present Unknown           * Hospital Course: Antonio BABERS Sr. is a 60 y.o. male with a PMH of diabetes, hypertension, alcohol abuse, and hyperlipidemia who presented to the freestanding ED with abdominal pain. In [facility] tachycardic and hypertensive. Initial labs significant for WBC of 12.5, sodium of 126, glucose of 273, bilirubin of 1.9, ALT of 241, AST of 347, alk phosphatase of 300, lipase of 1093, lactate of 4.6, and BNP of 541. Urinalysis with proteinuria, glucosuria, ketonuria, hematuria, pyuria and leuk esterase with bacteria. CT of the abdomen showed moderate acute pancreatitis with edema of the adjacent gastric wall. Patient admitted for further work-up. Patient started on IV Pepcid, fluids, Zosyn, and pain control. Blood culture pending. GI and general surgery consulted. General surgery deemed not a surgical candidate at this time. Patient WBC, CRP, and procalcitonin elevated. ID consulted. Will add vancomycin for empiric coverage. No growth to date with blood culture we will discontinue vancomycin. Urine culture grew E Coli. Repeat CT abd/pelv shows stable pancreatitis.    * Procedures:   * No surgery found *      Consults: ID and GI    Significant Diagnostic Studies:     Discharge Exam:  Vitals and nursing note reviewed.   Constitutional:       Appearance: He is obese.   HENT:     Head: Normocephalic and  atraumatic.   Eyes:     General: Scleral icterus present.   Cardiovascular:     Rate and Rhythm: Normal rate and regular rhythm.   Pulmonary:     Effort: No respiratory distress.      Breath sounds: No wheezing.   Abdominal:      General: Bowel sounds are normal. There is no distension.      Palpations: Abdomen is soft.      Comments: Negative for cullen or turners sign    Genitourinary:     Comments: No foley present  Musculoskeletal:      Right lower leg: No edema.      Left lower leg: No edema.  Skin:     General: Skin is warm.   Neurological:     Mental Status: He is alert and oriented to person, place, and time.   Psychiatric:         Mood and Affect: Mood normal.    * Discharge Condition: improved  * Disposition: Home    Discharge Medications:  Discharge Medication List as of 08/26/2020  4:09 PM          * Follow-up Care/Patient Instructions:  Activity: Activity as tolerated  Diet: Resume previous diet  Wound Care: None needed    Follow-up Information    None  Time Spent: > 35 minutes      Signed:  Vira Browns, NP  09/20/2020  2:53 PM

## 2020-08-26 NOTE — Progress Notes (Signed)
Progress Notes by Jene Every, MD at 08/26/20 1552                Author: Jene Every, MD  Service: MEDICINE  Author Type: Physician       Filed: 08/26/20 1606  Date of Service: 08/26/20 1552  Status: Signed          Editor: Jene Every, MD (Physician)                                    Infectious Disease Progress Note               Subjective:     Assessed pt at bedside. Will like to be discharged home today but lipase elevated at 1,744. He denies abdominal pain and is tolerating oral intake      Objective:     Physical Exam:       Visit Vitals      BP  (!) 159/97 (BP 1 Location: Left upper arm, BP Patient Position: Sitting)     Pulse  (!) 111     Temp  97.5 ??F (36.4 ??C)     Resp  20     Ht  5' 9.02" (1.753 m)     Wt  192 lb (87.1 kg)     SpO2  99%        BMI  28.34 kg/m??        O2 Device:  None (Room air)      Temp (24hrs), Avg:97.8 ??F (36.6 ??C), Min:97.5 ??F (36.4 ??C), Max:98.1 ??F (36.7 ??C)     07/01 0701 - 07/01 1900   In: 50 [I.V.:50]   Out: -    No intake/output data recorded.      General: NAD, alert, AAO x 4   HEENT: PERLA, Moist mucosa    Lungs: CTA b/l, decreased at the bases    Heart: S1S2+, RRR, no murmur   Abdo: Soft, Decreased LUQ pain on deep palpation    GU: no foley cath    Exts: No edema, + pulses b/l    Skin: No wounds, No rashes or lesions           Data Review:          Recent Days:     Recent Labs            08/25/20   0818  08/24/20   1135     WBC  6.9  7.8     HGB  12.5  12.5     HCT  39.1  38.8         PLT  469*  384          Recent Labs            08/25/20   0818  08/24/20   1135     BUN  3*  4*         CREA  1.11  1.10             Lab Results         Component  Value  Date/Time            C-Reactive protein  7.38 (H)  08/23/2020 08:15 AM            Microbiology         Results  Procedure  Component  Value  Units  Date/Time           CULTURE, URINE [[id number]]  (Abnormal)  (Susceptibility)  Collected: 08/18/20 1209            Order Status: Completed   Specimen: Urine  Updated: 08/20/20 1317                Special Requests:  No Special Requests              Colony Count  --                  >100,000   colonies/ml                   Culture result:  Escherichia coli                 Susceptibility           Escherichia coli          MIC          Amikacin ($)  Susceptible          Ampicillin ($)  Susceptible          Ampicillin/sulbactam ($)  Susceptible          Cefazolin ($)  Susceptible          Cefepime ($$)  Susceptible          Cefoxitin  Susceptible          Ceftazidime ($)  Susceptible          Ceftriaxone ($)  Susceptible          Ciprofloxacin ($)  Susceptible          Gentamicin ($)  Susceptible          Levofloxacin ($)  Susceptible          Meropenem ($$)  Susceptible          Nitrofurantoin  Susceptible          Piperacillin/Tazobac ($)  Susceptible          Tobramycin ($)  Susceptible          Trimeth/Sulfa  Susceptible                                   Linear View                                            CULTURE, BLOOD, PAIRED [[id number]]  Collected: 08/18/20 1119            Order Status: Completed  Specimen: Blood  Updated: 08/24/20 0915                Special Requests:  No Special Requests                     Culture result:  No growth 6 days                         Diagnostics      CXR Results   (Last 48 hours)          None                CT abdo/pel w contrast  06/27: Stable findings of pancreatitis with peripancreatic fluid and edema with   extension into the transverse mesocolon. There is no evidence of pancreatic   necrosis. Negative for abscess. There is reactive wall thickening of the distal   duodenum and proximal jejunum. There is no evidence of bowel obstruction   Trace free fluid noted. The appendix is normal. Urinary bladder is empty.   Prostate gland and seminal vesicles are unremarkable.??   No suspicious lytic or blastic skeletal lesion apparent.        Assessment/Plan        1. Acute severe pancreatitis, alcohol associated, abdominal  pain is better        No abscess or pancreatic necrosis on repeat CT (06/27)       Lipase still elevated, trending up, reviewed all med, lisinopril associated a pancreatitis        Pt has decided to leaved AM, but advised to stop taking lisinopril until resolution of pancreatitis       S/p > 7 days of antibiotics, no abscess or pancreatic necrosis on CT abdo/pel       Remains afebrile w a normal WBC on routine labs, no strong indication for antibiotics upon d/c        Will f/u in a wk if discharged today   ??   2. UTI, Pansenstive E.coli isolated from Cx, S/p appropriate tx    ??   3. Alcoholism, due to depression per pt, counseled    ??   4. HTN, uncontrolled, advised to stop taking lisinopril due to concerns it may be contributing to pancreatitis        Norvasc ordered for BP control in addition to Metoprolol       Pt states he has been on Norvasc before w/o any untoward side effects       Jene Every, MD      08/26/2020

## 2020-08-26 NOTE — Progress Notes (Signed)
Pt left premises with belongings and support person.

## 2020-08-26 NOTE — Progress Notes (Signed)
Pt frustrated with care. Lab resulted shows increase in Lipase. NP Bugg notified, will come talk to patient.     NP at bedside, pt asking to leave AMA.     AMA forms given to patient to sign.     IV removed. Tele removed.     Pt got into the shower prior to DC. Support person at bedside.   Pt to walk out when done, steady indep.

## 2020-08-26 NOTE — Progress Notes (Signed)
Patient walked around the unit several times during this shift. Pain managed with Percocet. IV fluid ongoing,  IV antibiotic given with no adverse effect noted.

## 2020-08-26 NOTE — Progress Notes (Signed)
Hospitalist Progress Note    Subjective:   Daily Progress Note: 08/26/2020 7:59 AM    Patient examined alert and oriented sitting on bedside. He reports improvement in abdominal pain. No overnight events reported. No acute distress noted on examination.    Current Facility-Administered Medications   Medication Dose Route Frequency   ??? metoprolol tartrate (LOPRESSOR) tablet 50 mg  50 mg Oral BID   ??? oxyCODONE-acetaminophen (PERCOCET) 5-325 mg per tablet 1 Tablet  1 Tablet Oral Q4H PRN   ??? [Held by provider] morphine injection 1 mg  1 mg IntraVENous Q4H PRN   ??? 0.9% sodium chloride with KCl 40 mEq/L infusion   IntraVENous CONTINUOUS   ??? meropenem (MERREM) 1 g in 0.9% sodium chloride (MBP/ADV) 50 mL MBP  1 g IntraVENous Q8H   ??? chlordiazePOXIDE (LIBRIUM) capsule 5 mg  5 mg Oral TID   ??? prochlorperazine (COMPAZINE) injection 5 mg  5 mg IntraVENous Q3H PRN   ??? baclofen (LIORESAL) tablet 5 mg  5 mg Oral TID   ??? famotidine (PF) (PEPCID) 20 mg in 0.9% sodium chloride 10 mL injection  20 mg IntraVENous Q12H   ??? hydrALAZINE (APRESOLINE) 20 mg/mL injection 10 mg  10 mg IntraVENous QID PRN   ??? sodium chloride (NS) flush 5-40 mL  5-40 mL IntraVENous Q8H   ??? sodium chloride (NS) flush 5-40 mL  5-40 mL IntraVENous PRN   ??? acetaminophen (TYLENOL) tablet 650 mg  650 mg Oral Q6H PRN    Or   ??? acetaminophen (TYLENOL) suppository 650 mg  650 mg Rectal Q6H PRN   ??? polyethylene glycol (MIRALAX) packet 17 g  17 g Oral DAILY PRN   ??? ondansetron (ZOFRAN ODT) tablet 4 mg  4 mg Oral Q8H PRN    Or   ??? ondansetron (ZOFRAN) injection 4 mg  4 mg IntraVENous Q6H PRN   ??? enoxaparin (LOVENOX) injection 40 mg  40 mg SubCUTAneous DAILY   ??? lisinopriL (PRINIVIL, ZESTRIL) tablet 40 mg  40 mg Oral DAILY   ??? LORazepam (ATIVAN) injection 1 mg  1 mg IntraVENous Q2H PRN   ??? glucose chewable tablet 16 g  4 Tablet Oral PRN   ??? dextrose 10% infusion 0-250 mL  0-250 mL IntraVENous PRN   ??? glucagon (GLUCAGEN) injection 1 mg  1 mg IntraMUSCular PRN   ??? insulin NPH  (NOVOLIN N, HUMULIN N) injection 17 Units  0.2 Units/kg SubCUTAneous ACB&D   ??? insulin lispro (HUMALOG) injection   SubCUTAneous AC&HS        Review of Systems  Review of Systems   Constitutional: Negative.    Respiratory: Negative.    Cardiovascular: Negative.    Gastrointestinal: Positive for abdominal pain. Negative for nausea and vomiting.   Neurological: Negative for dizziness, tremors and headaches.   All other systems reviewed and are negative.       Objective:     Visit Vitals  BP (!) 159/97 (BP 1 Location: Left upper arm, BP Patient Position: Sitting)   Pulse (!) 111   Temp 97.5 ??F (36.4 ??C)   Resp 20   Ht 5' 9.02" (1.753 m)   Wt 87.1 kg (192 lb)   SpO2 99%   BMI 28.34 kg/m??      O2 Device: None (Room air)    Temp (24hrs), Avg:97.8 ??F (36.6 ??C), Min:97.5 ??F (36.4 ??C), Max:98.1 ??F (36.7 ??C)      07/01 0701 - 07/01 1900  In: 50 [I.V.:50]  Out: -  No intake/output data recorded.    Recent Results (from the past 24 hour(s))   GLUCOSE, POC    Collection Time: 08/25/20  4:54 PM   Result Value Ref Range    Glucose (POC) 136 (H) 65 - 117 mg/dL    Performed by Ines Bloomer    GLUCOSE, POC    Collection Time: 08/25/20  7:26 PM   Result Value Ref Range    Glucose (POC) 331 (H) 65 - 117 mg/dL    Performed by Cathlean Marseilles    GLUCOSE, POC    Collection Time: 08/26/20  8:27 AM   Result Value Ref Range    Glucose (POC) 209 (H) 65 - 117 mg/dL    Performed by Everette Rank    GLUCOSE, POC    Collection Time: 08/26/20 12:30 PM   Result Value Ref Range    Glucose (POC) 261 (H) 65 - 117 mg/dL    Performed by Everette Rank         CT ABD PELV W CONT   Final Result   Stable pancreatitis.         [place] ABD LTD   Final Result      CT ABD PELV W CONT   Final Result   Moderate acute pancreatitis with edema of the adjacent gastric wall           PHYSICAL EXAM:    Physical Exam  Vitals and nursing note reviewed.   Constitutional:       Appearance: He is obese.   HENT:      Head: Normocephalic and atraumatic.   Eyes:      General: Scleral  icterus present.   Cardiovascular:      Rate and Rhythm: Normal rate and regular rhythm.   Pulmonary:      Effort: No respiratory distress.      Breath sounds: No wheezing.   Abdominal:      General: Bowel sounds are normal. There is no distension.      Palpations: Abdomen is soft.      Comments: Negative for cullen or turners sign    Genitourinary:     Comments: No foley present  Musculoskeletal:      Right lower leg: No edema.      Left lower leg: No edema.   Skin:     General: Skin is warm.   Neurological:      Mental Status: He is alert and oriented to person, place, and time.   Psychiatric:         Mood and Affect: Mood normal.          Data Review    Recent Results (from the past 24 hour(s))   GLUCOSE, POC    Collection Time: 08/25/20  4:54 PM   Result Value Ref Range    Glucose (POC) 136 (H) 65 - 117 mg/dL    Performed by Ines Bloomer    GLUCOSE, POC    Collection Time: 08/25/20  7:26 PM   Result Value Ref Range    Glucose (POC) 331 (H) 65 - 117 mg/dL    Performed by Cathlean Marseilles    GLUCOSE, POC    Collection Time: 08/26/20  8:27 AM   Result Value Ref Range    Glucose (POC) 209 (H) 65 - 117 mg/dL    Performed by Everette Rank    GLUCOSE, POC    Collection Time: 08/26/20 12:30 PM   Result Value Ref Range    Glucose (  POC) 261 (H) 65 - 117 mg/dL    Performed by Everette Rank         Assessment/Plan:     Principal Problem:    Pancreatitis (08/18/2020)    Active Problems:    Acute pancreatitis (08/19/2020)      Acute cystitis with hematuria (08/20/2020)        Hospital Course:    CAILLOU MINUS Sr. is a 60 y.o. male with a PMH of diabetes, hypertension, alcohol abuse, and hyperlipidemia who presented to the freestanding ED with abdominal pain. In FSED tachycardic and hypertensive. Initial labs significant for WBC of 12.5, sodium of 126, glucose of 273, bilirubin of 1.9, ALT of 241, AST of 347, alk phosphatase of 300, lipase of 1093, lactate of 4.6, and BNP of 541. Urinalysis with proteinuria, glucosuria, ketonuria,  hematuria, pyuria and leuk esterase with bacteria. CT of the abdomen showed moderate acute pancreatitis with edema of the adjacent gastric wall. Patient admitted for further work-up. Patient started on IV Pepcid, fluids, Zosyn, and pain control. Blood culture pending. GI and general surgery consulted. General surgery deemed not a surgical candidate at this time. Patient WBC, CRP, and procalcitonin elevated. ID consulted. Will add vancomycin for empiric coverage. No growth to date with blood culture we will discontinue vancomycin. Urine culture grew E Coli. Repeat CT abd/pelv shows stable pancreatitis.    Pancreatitis secondary to etoh abuse  Lipase 1093>697>424>1045-->2320-->2021-->2664-->2031-->1596  Continue on IVF and pain control  Advance diet per GI: Regular  GI and general surgery following  Repeat CT abd/pelv shows stable pancreatitis.  ??  Sepsis, POA with tachycardia, WBC elevated and lactic  Down-trending inflammatory markers, trend  Continue on zosyn and IVF  No growth to date with blood culture we will discontinue vancomycin  6/23 blood: NGTD, prelim  ID following  ??  Urinary tract infection  6/23 urine: E Coli  Zosyn dc'd 6/27, merrem initiated  ??  Elevated transamidase  Hepatitis panel non-reactive  ??  Diabetes Mellitus, type II  A1c 11.6  Continue 17 units of NPH  SSI as needed  ??  Hypertension  BP currently at goal  Continue on metoprolol and lisinopril  Prn hydralazine  ??  Hyperlipidemia  Hold pravastatin  ??  Alcohol use  Continue on librium, wean as tolerated  CIWA protocol in place    Hypokalemia/Hypomagnesemia  K 2.8  Replenish with PO and IV K  Replenish with IV mag 2gm   Am labs pending  ??    DVT Prophylaxis: lovenox  GI Prophylaxis: pepcid  Discharge and disposition barriers:   AM labs pending  ? Discharge pending labs      Above treatment plan and diagnostic results discussed with patient in detail a t bedside, all questions answered.     Care Plan discussed with: Interdisciplinary  team    Total time spent with patient: 35 minutes.

## 2020-08-26 NOTE — Progress Notes (Signed)
DC Plan: Home    CM will continue to follow.

## 2020-09-27 DIAGNOSIS — E1165 Type 2 diabetes mellitus with hyperglycemia: Secondary | ICD-10-CM

## 2020-09-27 NOTE — ED Notes (Signed)
Patient reports that he has bilateral burning and tingling in feet. Patient is diabetic last blood glucose 101, new to the area and has an appointment with new pcp 10/14/2020.

## 2020-09-28 ENCOUNTER — Inpatient Hospital Stay
Admit: 2020-09-28 | Discharge: 2020-09-28 | Disposition: A | Discharge status: Home / Self Care | Payer: MEDICAID | Attending: Family Medicine

## 2020-09-28 LAB — CBC WITH AUTOMATED DIFF
ABS. BASOPHILS: 0 10*3/uL (ref 0.0–0.1)
ABS. EOSINOPHILS: 0.1 10*3/uL (ref 0.0–0.4)
ABS. IMM. GRANS.: 0 10*3/uL (ref 0.00–0.04)
ABS. LYMPHOCYTES: 3.1 10*3/uL (ref 0.8–3.5)
ABS. MONOCYTES: 0.6 10*3/uL (ref 0.0–1.0)
ABS. NEUTROPHILS: 2.6 10*3/uL (ref 1.8–8.0)
BASOPHILS: 0 % (ref 0–1)
EOSINOPHILS: 1 % (ref 0–7)
HCT: 38.5 % (ref 36.6–50.3)
HGB: 12.8 g/dL (ref 12.1–17.0)
IMMATURE GRANULOCYTES: 0 % (ref 0.0–0.5)
LYMPHOCYTES: 49 % (ref 12–49)
MCH: 27.8 PG (ref 26.0–34.0)
MCHC: 33.2 g/dL (ref 30.0–36.5)
MCV: 83.7 FL (ref 80.0–99.0)
MONOCYTES: 10 % (ref 5–13)
MPV: 10.7 FL (ref 8.9–12.9)
NEUTROPHILS: 40 % (ref 32–75)
PLATELET: 416 10*3/uL — ABNORMAL HIGH (ref 150–400)
RBC: 4.6 M/uL (ref 4.10–5.70)
RDW: 13.5 % (ref 11.5–14.5)
WBC: 6.4 10*3/uL (ref 4.1–11.1)

## 2020-09-28 LAB — GLUCOSE, POC
Glucose (POC): >600 mg/dL — CR (ref 65–117)
Glucose (POC): 455 mg/dL — ABNORMAL HIGH (ref 65–117)
Glucose (POC): 430 mg/dL — ABNORMAL HIGH (ref 65–117)

## 2020-09-28 LAB — METABOLIC PANEL, COMPREHENSIVE
A-G Ratio: 0.9 — ABNORMAL LOW (ref 1.1–2.2)
ALT (SGPT): 30 U/L (ref 12–78)
AST (SGOT): 22 U/L (ref 15–37)
Albumin: 3.4 g/dL — ABNORMAL LOW (ref 3.5–5.0)
Alk. phosphatase: 208 U/L — ABNORMAL HIGH (ref 45–117)
Anion gap: 15 mmol/L (ref 5–15)
BUN/Creatinine ratio: 5 — ABNORMAL LOW (ref 12–20)
BUN: 8 mg/dL (ref 6–20)
Bilirubin, total: 0.2 mg/dL (ref 0.2–1.0)
CO2: 24 mmol/L (ref 21–32)
Calcium: 8.4 mg/dL — ABNORMAL LOW (ref 8.5–10.1)
Chloride: 88 mmol/L — ABNORMAL LOW (ref 97–108)
Creatinine: 1.73 mg/dL — ABNORMAL HIGH (ref 0.70–1.30)
GFR est AA: 49 mL/min/{1.73_m2} — ABNORMAL LOW (ref >60)
GFR est non-AA: 41 mL/min/{1.73_m2} — ABNORMAL LOW (ref >60)
Globulin: 3.9 g/dL (ref 2.0–4.0)
Glucose: 810 mg/dL — CR (ref 65–100)
Potassium: 4.3 mmol/L (ref 3.5–5.1)
Protein, total: 7.3 g/dL (ref 6.4–8.2)
Sodium: 127 mmol/L — ABNORMAL LOW (ref 136–145)

## 2020-09-28 LAB — COMPREHENSIVE METABOLIC PANEL
ALT: 30 U/L (ref 12–78)
AST: 22 U/L (ref 15–37)
Albumin/Globulin Ratio: 0.9 — ABNORMAL LOW (ref 1.1–2.2)
Albumin: 3.4 g/dL — ABNORMAL LOW (ref 3.5–5.0)
Alkaline Phosphatase: 208 U/L — ABNORMAL HIGH (ref 45–117)
Anion Gap: 15 mmol/L (ref 5–15)
BUN: 8 mg/dL (ref 6–20)
Bun/Cre Ratio: 5 — ABNORMAL LOW (ref 12–20)
CO2: 24 mmol/L (ref 21–32)
Calcium: 8.4 mg/dL — ABNORMAL LOW (ref 8.5–10.1)
Chloride: 88 mmol/L — ABNORMAL LOW (ref 97–108)
Creatinine: 1.73 mg/dL — ABNORMAL HIGH (ref 0.70–1.30)
EGFR IF NonAfrican American: 41 mL/min/{1.73_m2} — ABNORMAL LOW (ref >60)
GFR African American: 49 mL/min/{1.73_m2} — ABNORMAL LOW (ref >60)
Globulin: 3.9 g/dL (ref 2.0–4.0)
Glucose: 810 mg/dL (ref 65–100)
Potassium: 4.3 mmol/L (ref 3.5–5.1)
Sodium: 127 mmol/L — ABNORMAL LOW (ref 136–145)
Total Bilirubin: 0.2 mg/dL (ref 0.2–1.0)
Total Protein: 7.3 g/dL (ref 6.4–8.2)

## 2020-09-28 LAB — CBC WITH AUTO DIFFERENTIAL
Basophils %: 0 % (ref 0–1)
Basophils Absolute: 0 10*3/uL (ref 0.0–0.1)
Eosinophils %: 1 % (ref 0–7)
Eosinophils Absolute: 0.1 10*3/uL (ref 0.0–0.4)
Granulocyte Absolute Count: 0 10*3/uL (ref 0.00–0.04)
Hematocrit: 38.5 % (ref 36.6–50.3)
Hemoglobin: 12.8 g/dL (ref 12.1–17.0)
Immature Granulocytes: 0 % (ref 0.0–0.5)
Lymphocytes %: 49 % (ref 12–49)
Lymphocytes Absolute: 3.1 10*3/uL (ref 0.8–3.5)
MCH: 27.8 PG (ref 26.0–34.0)
MCHC: 33.2 g/dL (ref 30.0–36.5)
MCV: 83.7 FL (ref 80.0–99.0)
MPV: 10.7 FL (ref 8.9–12.9)
Monocytes %: 10 % (ref 5–13)
Monocytes Absolute: 0.6 10*3/uL (ref 0.0–1.0)
Neutrophils %: 40 % (ref 32–75)
Neutrophils Absolute: 2.6 10*3/uL (ref 1.8–8.0)
Platelets: 416 10*3/uL — ABNORMAL HIGH (ref 150–400)
RBC: 4.6 M/uL (ref 4.10–5.70)
RDW: 13.5 % (ref 11.5–14.5)
WBC: 6.4 10*3/uL (ref 4.1–11.1)

## 2020-09-28 LAB — POCT GLUCOSE
POC Glucose: >600 mg/dL (ref 65–117)
POC Glucose: 455 mg/dL — ABNORMAL HIGH (ref 65–117)
POC Glucose: 430 mg/dL — ABNORMAL HIGH (ref 65–117)

## 2020-09-28 MED ORDER — SODIUM CHLORIDE 0.9% BOLUS IV
0.9 % | Freq: Once | INTRAVENOUS | Status: AC
Start: 2020-09-28 — End: 2020-09-28
  Administered 2020-09-28: 04:00:00 via INTRAVENOUS

## 2020-09-28 MED ORDER — INSULIN REGULAR HUMAN 100 UNIT/ML INJECTION
100 unit/mL | INTRAMUSCULAR | Status: AC
Start: 2020-09-28 — End: 2020-09-28
  Administered 2020-09-28: 05:00:00 via INTRAVENOUS

## 2020-09-28 MED ORDER — GABAPENTIN 100 MG CAP
100 mg | Freq: Once | ORAL | Status: AC
Start: 2020-09-28 — End: 2020-09-27
  Administered 2020-09-28: 03:00:00 via ORAL

## 2020-09-28 MED ORDER — INSULIN REGULAR HUMAN 100 UNIT/ML INJECTION
100 unit/mL | Freq: Once | INTRAMUSCULAR | Status: AC
Start: 2020-09-28 — End: 2020-09-28
  Administered 2020-09-28: 06:00:00 via INTRAVENOUS

## 2020-09-28 MED ORDER — SODIUM CHLORIDE 0.9% BOLUS IV
0.9 % | Freq: Once | INTRAVENOUS | Status: AC
Start: 2020-09-28 — End: 2020-09-28
  Administered 2020-09-28: 05:00:00 via INTRAVENOUS

## 2020-09-28 MED ORDER — INSULIN REGULAR HUMAN 100 UNIT/ML INJECTION
100 unit/mL | Freq: Once | INTRAMUSCULAR | Status: AC
Start: 2020-09-28 — End: 2020-09-28

## 2020-09-28 MED ORDER — ALBUTEROL SULFATE 2.5 MG/0.5 ML NEB SOLUTION
2.5 mg/0.5 mL | RESPIRATORY_TRACT | Status: DC
Start: 2020-09-28 — End: 2020-09-27

## 2020-09-28 MED ORDER — GABAPENTIN 300 MG CAP
300 mg | ORAL_CAPSULE | Freq: Every evening | ORAL | 0 refills | Status: AC
Start: 2020-09-28 — End: 2020-10-05

## 2020-09-28 MED FILL — HUMULIN R REGULAR U-100 INSULIN 100 UNIT/ML INJECTION SOLUTION: 100 unit/mL | INTRAMUSCULAR | Qty: 7

## 2020-09-28 MED FILL — SODIUM CHLORIDE 0.9 % IV: INTRAVENOUS | Qty: 1000

## 2020-09-28 MED FILL — HUMULIN R REGULAR U-100 INSULIN 100 UNIT/ML INJECTION SOLUTION: 100 unit/mL | INTRAMUSCULAR | Qty: 10

## 2020-09-28 MED FILL — GABAPENTIN 100 MG CAP: 100 mg | ORAL | Qty: 3

## 2020-09-28 NOTE — ED Provider Notes (Signed)
ED Provider Notes by Mirian Mo, DO at 09/28/20 0310                Author: Mirian Mo, DO  Service: --  Author Type: Physician       Filed: 09/28/20 0315  Date of Service: 09/28/20 0310  Status: Signed          Editor: Mirian Mo, DO (Physician)               EMERGENCY DEPARTMENT HISTORY AND PHYSICAL EXAM           Date: 09/27/2020   Patient Name: Antonio Bast Sr.        History of Presenting Illness          Chief Complaint       Patient presents with        ?  Foot Pain           History Provided By:       HPI: Willette Pa., is a very pleasant 60 y.o. male presenting to the ED with a chief complaint of foot pain.  Patient has diabetes and states  he has been having nerve pains in his feet.  Previously prescribed gabapentin but he is not sure if this has helped.  Patient admits to not eating very well lately and states his sugar has been elevated.  No nausea vomiting or diarrhea.  No abdominal  pain.  No fevers chills rigors.      The patient denies any other symptoms at this time.      PCP: Other, Phys, MD        No current facility-administered medications on file prior to encounter.          Current Outpatient Medications on File Prior to Encounter          Medication  Sig  Dispense  Refill           ?  amLODIPine (NORVASC) 10 mg tablet  Take 1 Tablet by mouth daily.  30 Tablet  3     ?  metFORMIN ER (GLUCOPHAGE XR) 500 mg tablet  Take 500 mg by mouth daily.         ?  metoprolol succinate (TOPROL-XL) 25 mg XL tablet  Take 25 mg by mouth daily.         ?  chlordiazePOXIDE (LIBRIUM) 10 mg capsule  Take 1 Capsule by mouth three (3) times daily. Max Daily Amount: 30 mg. (Patient not taking: Reported on 08/18/2020)  21 Capsule  1           ?  sucralfate (CARAFATE) 1 gram tablet  Take 1 Tablet by mouth Before breakfast, lunch, dinner and at bedtime. (Patient not taking: Reported on 08/18/2020)  21 Tablet  1           ?  SITagliptin (Januvia) 50 mg tablet  Take 50 mg by mouth daily.  (Patient not taking: Reported on 08/18/2020)         ?  insulin regular (NOVOLIN R, HUMULIN R) 100 unit/mL injection  by SubCUTAneous route. On a scale insulin (Patient not taking: Reported on 08/18/2020)         ?  omeprazole (PRILOSEC) 40 mg capsule  Take 40 mg by mouth daily.         ?  ramipriL (ALTACE) 10 mg capsule  Take 10 mg by mouth daily.         ?  metoprolol tartrate (LOPRESSOR) 25 mg tablet  Take 25 mg by mouth two (2) times a day.         ?  rosuvastatin (Crestor) 40 mg tablet  Take 40 mg by mouth nightly.         ?  DULoxetine (CYMBALTA) 60 mg capsule  Take  by mouth.         ?  insulin nph-regular human rec (NovoLIN 70-30 FlexPen U-100) 100 unit/mL (70-30) inpn  Inject 48 units qAM, 28 units qhs  2 Each  0           ?  [DISCONTINUED] gabapentin (NEURONTIN) 600 mg tablet  Take 600 mg by mouth At bedtime.                 Past History        Past Medical History:     Past Medical History:        Diagnosis  Date         ?  Diabetes ([place])       ?  High cholesterol           ?  Hypertension             Past Surgical History:     Past Surgical History:         Procedure  Laterality  Date          ?  HX CHOLECYSTECTOMY               Family History:     Family History         Problem  Relation  Age of Onset          ?  Hypertension  Mother             Social History:     Social History          Tobacco Use         ?  Smoking status:  Never     ?  Smokeless tobacco:  Never       Vaping Use         ?  Vaping Use:  Never used       Substance Use Topics         ?  Alcohol use:  Yes              Alcohol/week:  74.0 standard drinks         Types:  74 Cans of beer per week         ?  Drug use:  Never           Allergies:   No Known Allergies           Review of Systems        Review of Systems    Constitutional:  Negative for activity change, appetite change, chills, fatigue and fever.    HENT:  Negative for congestion and sore throat.     Eyes:  Negative for photophobia and visual disturbance.    Respiratory:   Negative for cough, shortness of breath and wheezing.     Cardiovascular:  Negative for chest pain, palpitations and leg swelling.    Gastrointestinal:  Negative for abdominal pain, diarrhea, nausea and vomiting.    Endocrine: Negative for cold intolerance and heat intolerance.    Musculoskeletal:  Negative for gait problem and joint swelling.    Skin:  Negative for color change and rash.  HPI    Neurological:  Negative for dizziness and headaches.         Physical Exam        Physical Exam   Constitutional :        General: He is not in acute distress.      Appearance: Normal appearance. He is not toxic-appearing.    HENT:       Head: Normocephalic and atraumatic.       Right Ear: External ear normal.       Left Ear: External ear normal.       Mouth/Throat:       Mouth: Mucous membranes are moist.       Pharynx: Oropharynx is clear.    Eyes:       Extraocular Movements: Extraocular movements intact.       Conjunctiva/sclera: Conjunctivae normal.     Cardiovascular:       Rate and Rhythm: Normal rate and regular rhythm.       Pulses: Normal pulses.       Heart sounds: Normal heart sounds.    Pulmonary:       Effort: Pulmonary effort is normal. No respiratory distress.       Breath sounds: Normal breath sounds. No wheezing, rhonchi or rales.     Abdominal:       General: There is no distension.       Palpations: Abdomen is soft.       Tenderness: There is no abdominal tenderness. There is no guarding or rebound.     Musculoskeletal:          General: No deformity.       Cervical back: Normal range of motion and neck supple.       Right lower leg: No edema.       Left lower leg: No edema.    Skin:      Capillary Refill: Capillary refill takes less than 2 seconds.       Coloration: Skin is not jaundiced or pale.       Findings: No bruising, erythema, lesion or rash.    Neurological:       General: No focal deficit present.       Mental Status: He is alert and oriented to person, place, and time.       Gait: Gait  normal.    Psychiatric:          Mood and Affect: Mood normal.          Behavior: Behavior normal.            Lab and Diagnostic Study Results        Labs -         Recent Results (from the past 12 hour(s))     CBC WITH AUTOMATED DIFF          Collection Time: 09/27/20 10:40 PM         Result  Value  Ref Range            WBC  6.4  4.1 - 11.1 K/uL       RBC  4.60  4.10 - 5.70 M/uL       HGB  12.8  12.1 - 17.0 g/dL       HCT  38.5  36.6 - 50.3 %       MCV  83.7  80.0 - 99.0 FL       MCH  27.8  26.0 - 34.0  PG       MCHC  33.2  30.0 - 36.5 g/dL       RDW  13.5  11.5 - 14.5 %       PLATELET  416 (H)  150 - 400 K/uL       MPV  10.7  8.9 - 12.9 FL       NEUTROPHILS  40  32 - 75 %       LYMPHOCYTES  49  12 - 49 %       MONOCYTES  10  5 - 13 %       EOSINOPHILS  1  0 - 7 %       BASOPHILS  0  0 - 1 %       IMMATURE GRANULOCYTES  0  0.0 - 0.5 %            ABS. NEUTROPHILS  2.6  1.8 - 8.0 K/UL            ABS. LYMPHOCYTES  3.1  0.8 - 3.5 K/UL       ABS. MONOCYTES  0.6  0.0 - 1.0 K/UL       ABS. EOSINOPHILS  0.1  0.0 - 0.4 K/UL       ABS. BASOPHILS  0.0  0.0 - 0.1 K/UL       ABS. IMM. GRANS.  0.0  0.00 - 0.04 K/UL       DF  AUTOMATED          METABOLIC PANEL, COMPREHENSIVE          Collection Time: 09/27/20 10:40 PM         Result  Value  Ref Range            Sodium  127 (L)  136 - 145 mmol/L       Potassium  4.3  3.5 - 5.1 mmol/L       Chloride  88 (L)  97 - 108 mmol/L       CO2  24  21 - 32 mmol/L       Anion gap  15  5 - 15 mmol/L       Glucose  810 (HH)  65 - 100 mg/dL       BUN  8  6 - 20 mg/dL       Creatinine  1.73 (H)  0.70 - 1.30 mg/dL       BUN/Creatinine ratio  5 (L)  12 - 20         GFR est AA  49 (L)  >60 ml/min/1.72m       GFR est non-AA  41 (L)  >60 ml/min/1.768m      Calcium  8.4 (L)  8.5 - 10.1 mg/dL       Bilirubin, total  0.2  0.2 - 1.0 mg/dL       AST (SGOT)  22  15 - 37 U/L       ALT (SGPT)  30  12 - 78 U/L       Alk. phosphatase  208 (H)  45 - 117 U/L       Protein, total  7.3  6.4 - 8.2 g/dL        Albumin  3.4 (L)  3.5 - 5.0 g/dL       Globulin  3.9  2.0 - 4.0 g/dL       A-G Ratio  0.9 (L)  1.1 - 2.2  GLUCOSE, POC          Collection Time: 09/28/20 12:34 AM         Result  Value  Ref Range            Glucose (POC)  >600 (HH)  65 - 117 mg/dL            Performed by  Orbie Hurst         GLUCOSE, POC          Collection Time: 09/28/20  1:30 AM         Result  Value  Ref Range            Glucose (POC)  455 (H)  65 - 117 mg/dL       Performed by  Orbie Hurst         GLUCOSE, POC          Collection Time: 09/28/20  2:39 AM         Result  Value  Ref Range            Glucose (POC)  430 (H)  65 - 117 mg/dL            Performed by  Stana Bunting             Radiologic Studies -    _0 @     CT Results  (Last 48 hours)             None                    CXR Results  (Last 48 hours)             None                          Medical Decision Making     - I am the first provider for this patient.   - I reviewed the vital signs, available nursing notes, past medical history, past surgical history, family history and social history.   - Initial assessment performed. The patients presenting problems have been discussed, and they are in agreement with the care plan formulated and outlined with them.  I have encouraged them to ask questions as they arise throughout their visit.      Vital Signs-Reviewed the patient's vital signs.   Patient Vitals for the past 12 hrs:            Temp  Pulse  Resp  BP  SpO2            09/28/20 0147  --  89  20  130/70  100 %            09/28/20 0043  --  92  20  136/74  99 %            09/27/20 2215  98 ??F (36.7 ??C)  98  18  (!) 148/89  98 %              ED Course/ Provider Notes (Medical Decision Making):       Patient presented to the emergency department with the aforementioned chief complaint.   On examination the patient is nontoxic.  Vitals were reviewed per above.  Basic laboratory work demonstrates elevated serum glucose, 810.  No signs of anion gap metabolic acidosis.   Patient also hypochloremic, hyponatremic with elevated serum creatinine.   Suspect related to dehydration.  Patient  given 2 L normal saline and insulin.  Improvement of blood glucose to 430.  In light of patient's AKI, hyperglycemia, recommended admission.  Patient declines.  States he will follow-up closely with his PCP and  monitor sugar closely.  Patient understands he is at risk of worsening of his condition, DKA, renal failure.  He understands he can return to the emergency department anytime.  Patient discharged home in stable and ambulatory condition.           Procedures     Medical Decision Makingedical Decision Making   Performed by: Mirian Mo, DO   Procedures  None            Disposition     Disposition:       Home       All of the diagnostic tests were reviewed and questions answered. Diagnosis, care plan and treatment options were discussed.  The patient understands the instructions and will follow up as directed. The patients results have been reviewed with them.   They have been counseled regarding their diagnosis.  The patient verbally convey understanding and agreement of the signs, symptoms, diagnosis, treatment and prognosis and additionally agrees to follow up as recommended with their PCP in 24 - 48 hours.   They also agree with the care-plan and convey that all of their questions have been answered.  I have also put together some discharge instructions for them that include: 1) educational information regarding their diagnosis, 2) how to care for their  diagnosis at home, as well a 3) list of reasons why they would want to return to the ED prior to their follow-up appointment, should their condition change.            DISCHARGE PLAN:      1.      Current Discharge Medication List                 START taking these medications          Details        gabapentin (NEURONTIN) 300 mg capsule  Take 1 Capsule by mouth nightly for 7 days. Max Daily Amount: 300 mg.   Qty: 7 Capsule, Refills: 0           Associated Diagnoses: Neuropathic pain                        CONTINUE these medications which have NOT CHANGED          Details        amLODIPine (NORVASC) 10 mg tablet  Take 1 Tablet by mouth daily.   Qty: 30 Tablet, Refills: 3               metFORMIN ER (GLUCOPHAGE XR) 500 mg tablet  Take 500 mg by mouth daily.               metoprolol succinate (TOPROL-XL) 25 mg XL tablet  Take 25 mg by mouth daily.               chlordiazePOXIDE (LIBRIUM) 10 mg capsule  Take 1 Capsule by mouth three (3) times daily. Max Daily Amount: 30 mg.   Qty: 21 Capsule, Refills: 1          Associated Diagnoses: Alcohol withdrawal syndrome with complication (HCC)               sucralfate (CARAFATE) 1 gram tablet  Take 1 Tablet by mouth  Before breakfast, lunch, dinner and at bedtime.   Qty: 21 Tablet, Refills: 1               SITagliptin (Januvia) 50 mg tablet  Take 50 mg by mouth daily.               insulin regular (NOVOLIN R, HUMULIN R) 100 unit/mL injection  by SubCUTAneous route. On a scale insulin               omeprazole (PRILOSEC) 40 mg capsule  Take 40 mg by mouth daily.               ramipriL (ALTACE) 10 mg capsule  Take 10 mg by mouth daily.               metoprolol tartrate (LOPRESSOR) 25 mg tablet  Take 25 mg by mouth two (2) times a day.               rosuvastatin (Crestor) 40 mg tablet  Take 40 mg by mouth nightly.               DULoxetine (CYMBALTA) 60 mg capsule  Take  by mouth.               insulin nph-regular human rec (NovoLIN 70-30 FlexPen U-100) 100 unit/mL (70-30) inpn  Inject 48 units qAM, 28 units qhs   Qty: 2 Each, Refills: 0                        STOP taking these medications                  gabapentin (NEURONTIN) 600 mg tablet  Comments:    Reason for Stopping:                                2.      Follow-up Information                  Follow up With  Specialties  Details  Why  Contact Info              Your primary care doctor    Call today                            3.  Return to ED if worse           4.      Current Discharge Medication List                 START taking these medications          Details        gabapentin (NEURONTIN) 300 mg capsule  Take 1 Capsule by mouth nightly for 7 days. Max Daily Amount: 300 mg.   Qty: 7 Capsule, Refills: 0   Start date: 09/28/2020, End date: 10/05/2020          Associated Diagnoses: Neuropathic pain                        STOP taking these medications                  gabapentin (NEURONTIN) 600 mg tablet  Comments:    Reason for Stopping:  Diagnosis        Clinical Impression:        1.  Hyperglycemia      2.  Elevated serum creatinine      3.  Dehydration         4.  Neuropathic pain            Attestations:      Mirian Mo, DO      Please note that this dictation was completed with Dragon, the computer voice recognition software.  Quite often unanticipated grammatical, syntax, homophones, and other interpretive errors are inadvertently  transcribed by the computer software.  Please disregard these errors.  Please excuse any errors that have escaped final proofreading.  Thank you.

## 2020-10-02 DIAGNOSIS — R21 Rash and other nonspecific skin eruption: Secondary | ICD-10-CM

## 2020-10-02 NOTE — ED Provider Notes (Signed)
ED Provider Notes by Haze Justin, MD at 10/02/20 2043                Author: Haze Justin, MD  Service: EMERGENCY  Author Type: Physician       Filed: 10/02/20 2049  Date of Service: 10/02/20 2043  Status: Signed          Editor: Haze Justin, MD (Physician)               EMERGENCY DEPARTMENT HISTORY AND PHYSICAL EXAM           Date: 10/02/2020   Patient Name: Antonio Douglas Sr.        History of Presenting Illness          Chief Complaint       Patient presents with        ?  Insect Bite           History Provided By: Patient      HPI: Antonio Douglas Sr., 60 y.o. male with a significant past medical history of diabetes and recently diagnosed neuropathy presents to the ED  with having bit by 2 ticks and having some muscle aches.  He also says that his neuropathy is not getting better with gabapentin.  Patient conveys that he has an appointment week after next with someone for primary care here.  He is also asked for a podiatrist  to be able to help him maintain the health of his feet.  He denies any fever, chills, nausea, vomiting, chest pain, shortness of breath, rash, diarrhea, headache, chills, weight loss.      There are no other complaints, changes, or physical findings at this time.      PCP: Other, Phys, MD        No current facility-administered medications on file prior to encounter.          Current Outpatient Medications on File Prior to Encounter          Medication  Sig  Dispense  Refill           ?  gabapentin (NEURONTIN) 300 mg capsule  Take 1 Capsule by mouth nightly for 7 days. Max Daily Amount: 300 mg.  7 Capsule  0     ?  amLODIPine (NORVASC) 10 mg tablet  Take 1 Tablet by mouth daily.  30 Tablet  3     ?  metFORMIN ER (GLUCOPHAGE XR) 500 mg tablet  Take 500 mg by mouth daily.         ?  metoprolol succinate (TOPROL-XL) 25 mg XL tablet  Take 25 mg by mouth daily.         ?  chlordiazePOXIDE (LIBRIUM) 10 mg capsule  Take 1 Capsule by mouth three (3) times daily. Max Daily  Amount: 30 mg. (Patient not taking: Reported on 08/18/2020)  21 Capsule  1     ?  sucralfate (CARAFATE) 1 gram tablet  Take 1 Tablet by mouth Before breakfast, lunch, dinner and at bedtime. (Patient not taking: Reported on 08/18/2020)  21 Tablet  1     ?  SITagliptin (Januvia) 50 mg tablet  Take 50 mg by mouth daily. (Patient not taking: Reported on 08/18/2020)         ?  insulin regular (NOVOLIN R, HUMULIN R) 100 unit/mL injection  by SubCUTAneous route. On a scale insulin (Patient not taking: Reported on 08/18/2020)         ?  omeprazole (PRILOSEC) 40 mg capsule  Take 40 mg by mouth daily.         ?  ramipriL (ALTACE) 10 mg capsule  Take 10 mg by mouth daily.         ?  metoprolol tartrate (LOPRESSOR) 25 mg tablet  Take 25 mg by mouth two (2) times a day.         ?  rosuvastatin (Crestor) 40 mg tablet  Take 40 mg by mouth nightly.         ?  DULoxetine (CYMBALTA) 60 mg capsule  Take  by mouth.               ?  insulin nph-regular human rec (NovoLIN 70-30 FlexPen U-100) 100 unit/mL (70-30) inpn  Inject 48 units qAM, 28 units qhs  2 Each  0             Past History        Past Medical History:     Past Medical History:        Diagnosis  Date         ?  Diabetes (HCC)       ?  High cholesterol           ?  Hypertension             Past Surgical History:     Past Surgical History:         Procedure  Laterality  Date          ?  HX CHOLECYSTECTOMY               Family History:     Family History         Problem  Relation  Age of Onset          ?  Hypertension  Mother             Social History:     Social History          Tobacco Use         ?  Smoking status:  Never     ?  Smokeless tobacco:  Never       Vaping Use         ?  Vaping Use:  Never used       Substance Use Topics         ?  Alcohol use:  Yes              Alcohol/week:  74.0 standard drinks         Types:  74 Cans of beer per week         ?  Drug use:  Never           Allergies:   No Known Allergies        Review of Systems     Review of Systems     Constitutional: Negative.  Negative for appetite change, chills, fatigue and fever.    HENT: Negative.  Negative for congestion and sinus pain.     Eyes: Negative.  Negative for pain and visual disturbance.    Respiratory: Negative.  Negative for chest tightness and shortness of breath.     Cardiovascular: Negative.  Negative for chest pain.    Gastrointestinal: Negative.  Negative for abdominal pain, diarrhea, nausea and vomiting.    Genitourinary: Negative.  Negative for difficulty urinating.         No discharge  Musculoskeletal: Negative.  Negative for arthralgias.    Skin:  Positive for rash and wound .    Neurological: Negative.  Negative for weakness and headaches.         Neuropathic pain in the feet    Hematological: Negative.     Psychiatric/Behavioral: Negative.  Negative for agitation. The patient is not nervous/anxious.      All other systems reviewed and are negative.        Physical Exam     Physical Exam   Vitals and nursing note reviewed.    Constitutional:        General: He is not in acute distress.      Appearance: He is well-developed.    HENT:       Head: Normocephalic and atraumatic.       Nose: Nose normal.       Mouth/Throat:       Mouth: Mucous membranes are moist.       Pharynx: Oropharynx is clear. No oropharyngeal exudate.    Eyes:       General:          Right eye: No discharge.          Left eye: No discharge.       Conjunctiva/sclera: Conjunctivae normal.       Pupils: Pupils are equal, round, and reactive to light.     Cardiovascular:       Rate and Rhythm: Normal rate and regular rhythm.       Chest Wall: PMI is not displaced. No thrill.       Heart sounds: Normal heart sounds. No murmur heard.     No friction rub. No gallop.    Pulmonary:       Effort: Pulmonary effort is normal. No respiratory distress.       Breath sounds: Normal breath sounds. No wheezing or rales.    Chest:       Chest wall: No tenderness.    Abdominal:       General: Bowel sounds are normal. There is no  distension.       Palpations: Abdomen is soft. There is no mass.       Tenderness: There is no abdominal tenderness. There is no guarding or rebound.     Musculoskeletal:          General: Normal range of motion.       Cervical back: Normal range of motion and neck supple.       Comments: DP and PT pulses intact no appreciable neuro, motor, vascular, sensory embarrassment     Lymphadenopathy:       Cervical: No cervical adenopathy.    Skin:      General: Skin is warm and dry.       Capillary Refill: Capillary refill takes less than 2 seconds.       Findings: No erythema or rash.       Comments: 2 possible insect bites to popliteal fossa of left leg.  No other appreciable rash no erythema no induration    Neurological:       Mental Status: He is alert and oriented to person, place, and time.       Cranial Nerves: No cranial nerve deficit.       Coordination: Coordination normal.    Psychiatric:          Mood and Affect: Mood normal.          Behavior: Behavior normal.  Lab and Diagnostic Study Results     Labs -    No results found for this or any previous visit (from the past 12 hour(s)).      Radiologic Studies -    @     CT Results  (Last 48 hours)             None                    CXR Results  (Last 48 hours)             None                       Medical Decision Making and ED Course     - I am the first provider for this patient.  I reviewed the vital signs, available nursing notes, past medical history, past surgical history, family history and social history.      - Initial assessment performed. The patients presenting problems have been discussed, and they are in agreement with the care plan formulated and outlined with them.  I have encouraged them to ask questions as they arise throughout their visit.      Vital Signs-Reviewed the patient's vital signs.   Patient Vitals for the past 12 hrs:            Temp  Pulse  Resp  BP  SpO2            10/02/20 2015  98 ??F (36.7 ??C)  99  18   103/70  99 %           Differential Diagnosis & Medical Decision Making Provider Note:    Will treat with doxycycline as an outpatient and provide podiatry follow-up   MDM          ED Course:        She is asking for long-term pain medication for his neuropathic pain.  I do not feel that he is optimized on his gabapentin as of yet.  I have the encourage the patient to use nonsteroidal anti-inflammatories as an outpatient also        Procedures     Performed by: Haze Justin, MD   Procedures          Disposition     Disposition: Condition stable   Discharged      DISCHARGE PLAN:   1.      Current Discharge Medication List                 CONTINUE these medications which have NOT CHANGED          Details        gabapentin (NEURONTIN) 300 mg capsule  Take 1 Capsule by mouth nightly for 7 days. Max Daily Amount: 300 mg.   Qty: 7 Capsule, Refills: 0          Associated Diagnoses: Neuropathic pain               amLODIPine (NORVASC) 10 mg tablet  Take 1 Tablet by mouth daily.   Qty: 30 Tablet, Refills: 3               metFORMIN ER (GLUCOPHAGE XR) 500 mg tablet  Take 500 mg by mouth daily.               metoprolol succinate (TOPROL-XL) 25 mg XL tablet  Take 25 mg by mouth daily.  chlordiazePOXIDE (LIBRIUM) 10 mg capsule  Take 1 Capsule by mouth three (3) times daily. Max Daily Amount: 30 mg.   Qty: 21 Capsule, Refills: 1          Associated Diagnoses: Alcohol withdrawal syndrome with complication (HCC)               sucralfate (CARAFATE) 1 gram tablet  Take 1 Tablet by mouth Before breakfast, lunch, dinner and at bedtime.   Qty: 21 Tablet, Refills: 1               SITagliptin (Januvia) 50 mg tablet  Take 50 mg by mouth daily.               insulin regular (NOVOLIN R, HUMULIN R) 100 unit/mL injection  by SubCUTAneous route. On a scale insulin               omeprazole (PRILOSEC) 40 mg capsule  Take 40 mg by mouth daily.               ramipriL (ALTACE) 10 mg capsule  Take 10 mg by mouth daily.                metoprolol tartrate (LOPRESSOR) 25 mg tablet  Take 25 mg by mouth two (2) times a day.               rosuvastatin (Crestor) 40 mg tablet  Take 40 mg by mouth nightly.               DULoxetine (CYMBALTA) 60 mg capsule  Take  by mouth.               insulin nph-regular human rec (NovoLIN 70-30 FlexPen U-100) 100 unit/mL (70-30) inpn  Inject 48 units qAM, 28 units qhs   Qty: 2 Each, Refills: 0                      2.      Follow-up Information                  Follow up With  Specialties  Details  Why  Contact Info              Rosana Fret, DPM  Podiatry  Call in 2 days    [address]   [place] [address]   [phone]                     3.  Return to ED if worse    4.      Current Discharge Medication List                 START taking these medications          Details        naproxen (NAPROSYN) 500 mg tablet  Take 1 Tablet by mouth every twelve (12) hours as needed for Pain.   Qty: 20 Tablet, Refills: 0   Start date: 10/02/2020                       Remove if admitted/transferred        Diagnosis/Clinical Impression        Clinical Impression:       1.  Rash         2.  Neuropathic pain            Attestations: I,  Haze Justinarlton L Thomes Burak, MD, am the primary clinician of record.      Please note that this dictation was completed with Dragon, the computer voice recognition software.  Quite often unanticipated grammatical, syntax, homophones, and other interpretive errors are inadvertently  transcribed by the computer software.  Please disregard these errors.  Please excuse any errors that have escaped final proofreading.  Thank you.

## 2020-10-02 NOTE — ED Notes (Signed)
Seen here a few days ago for tingling in fingers and toes. Diabetic. Given gabapentin and has been taking it as prescribed, but states it's not better. Also reports removing a tick from behind left knee weeks ago and still has 2 bumps. Also states he has pain from right buttock radiating into right thigh and groin.

## 2020-10-03 ENCOUNTER — Inpatient Hospital Stay
Admit: 2020-10-03 | Discharge: 2020-10-03 | Disposition: A | Discharge status: Home / Self Care | Payer: MEDICAID | Attending: Emergency Medicine

## 2020-10-03 MED ORDER — HYDROCODONE-ACETAMINOPHEN 5 MG-325 MG TAB
5-325 mg | Freq: Once | ORAL | Status: AC
Start: 2020-10-03 — End: 2020-10-02
  Administered 2020-10-03: 01:00:00 via ORAL

## 2020-10-03 MED ORDER — DOXYCYCLINE HYCLATE 100 MG TAB
100 mg | ORAL_TABLET | Freq: Two times a day (BID) | ORAL | 0 refills | Status: AC
Start: 2020-10-03 — End: 2020-10-09

## 2020-10-03 MED ORDER — NAPROXEN 500 MG TAB
500 mg | ORAL_TABLET | Freq: Two times a day (BID) | ORAL | 0 refills | Status: AC | PRN
Start: 2020-10-03 — End: 2021-04-27

## 2020-10-03 MED FILL — HYDROCODONE-ACETAMINOPHEN 5 MG-325 MG TAB: 5-325 mg | ORAL | Qty: 1

## 2020-10-19 LAB — HEMOGLOBIN A1C, EXTERNAL: Hemoglobin A1C, External: 12.9 %

## 2020-10-19 LAB — LDL CHOLESTEROL, EXTERNAL: LDL Cholesterol, External: 99

## 2020-10-19 LAB — MIRCOALBUMIN URINE, EXTERNAL: Microalbumin Urine, External: 6.5

## 2020-10-25 ENCOUNTER — Encounter: Attending: Podiatrist | Primary: Family Medicine

## 2020-12-21 ENCOUNTER — Inpatient Hospital Stay
Admit: 2020-12-21 | Discharge: 2020-12-21 | Disposition: A | Discharge status: Home / Self Care | Payer: MEDICAID | Attending: Emergency Medicine

## 2020-12-21 DIAGNOSIS — R1032 Left lower quadrant pain: Secondary | ICD-10-CM

## 2020-12-21 LAB — CBC WITH AUTOMATED DIFF
ABS. BASOPHILS: 0.1 10*3/uL (ref 0.0–0.1)
ABS. EOSINOPHILS: 0 10*3/uL (ref 0.0–0.4)
ABS. IMM. GRANS.: 0 10*3/uL (ref 0.00–0.04)
ABS. LYMPHOCYTES: 3.2 10*3/uL (ref 0.8–3.5)
ABS. MONOCYTES: 0.7 10*3/uL (ref 0.0–1.0)
ABS. NEUTROPHILS: 3.3 10*3/uL (ref 1.8–8.0)
BASOPHILS: 1 % (ref 0–1)
EOSINOPHILS: 0 % (ref 0–7)
HCT: 41.2 % (ref 36.6–50.3)
HGB: 14.1 g/dL (ref 12.1–17.0)
IMMATURE GRANULOCYTES: 0 % (ref 0.0–0.5)
LYMPHOCYTES: 45 % (ref 12–49)
MCH: 28.2 PG (ref 26.0–34.0)
MCHC: 34.2 g/dL (ref 30.0–36.5)
MCV: 82.4 FL (ref 80.0–99.0)
MONOCYTES: 9 % (ref 5–13)
MPV: 10.2 FL (ref 8.9–12.9)
NEUTROPHILS: 45 % (ref 32–75)
PLATELET: 375 10*3/uL (ref 150–400)
RBC: 5 M/uL (ref 4.10–5.70)
RDW: 13.9 % (ref 11.5–14.5)
WBC: 7.3 10*3/uL (ref 4.1–11.1)

## 2020-12-21 LAB — METABOLIC PANEL, COMPREHENSIVE
A-G Ratio: 0.8 — ABNORMAL LOW (ref 1.1–2.2)
ALT (SGPT): 55 U/L (ref 12–78)
AST (SGOT): 38 U/L — ABNORMAL HIGH (ref 15–37)
Albumin: 3.9 g/dL (ref 3.5–5.0)
Alk. phosphatase: 88 U/L (ref 45–117)
Anion gap: 16 mmol/L — ABNORMAL HIGH (ref 5–15)
BUN/Creatinine ratio: 9 — ABNORMAL LOW (ref 12–20)
BUN: 13 mg/dL (ref 6–20)
Bilirubin, total: 0.3 mg/dL (ref 0.2–1.0)
CO2: 24 mmol/L (ref 21–32)
Calcium: 9.1 mg/dL (ref 8.5–10.1)
Chloride: 84 mmol/L — ABNORMAL LOW (ref 97–108)
Creatinine: 1.4 mg/dL — ABNORMAL HIGH (ref 0.70–1.30)
Globulin: 4.7 g/dL — ABNORMAL HIGH (ref 2.0–4.0)
Glucose: 449 mg/dL — ABNORMAL HIGH (ref 65–100)
Potassium: 4 mmol/L (ref 3.5–5.1)
Protein, total: 8.6 g/dL — ABNORMAL HIGH (ref 6.4–8.2)
Sodium: 124 mmol/L — ABNORMAL LOW (ref 136–145)
eGFR: 58 mL/min/{1.73_m2} — ABNORMAL LOW (ref >60)

## 2020-12-21 LAB — URINALYSIS W/ REFLEX CULTURE
BACTERIA, URINE: NEGATIVE /hpf
Bacteria: NEGATIVE /hpf
Bilirubin, Urine: NEGATIVE
Bilirubin: NEGATIVE
Blood, Urine: NEGATIVE
Blood: NEGATIVE
Glucose, Ur: >1000 mg/dL — AB
Glucose: >1000 mg/dL — AB
Ketone: NEGATIVE mg/dL
Ketones, Urine: NEGATIVE mg/dL
Leukocyte Esterase, Urine: NEGATIVE
Leukocyte Esterase: NEGATIVE
Nitrite, Urine: NEGATIVE
Nitrites: NEGATIVE
Protein, UA: NEGATIVE mg/dL
Protein: NEGATIVE mg/dL
Specific Gravity, UA: 1.005 (ref 1.003–1.030)
Specific gravity: 1.005 (ref 1.003–1.030)
Urobilinogen, UA, POCT: 0.1 EU/dL — ABNORMAL LOW (ref 0.2–1.0)
Urobilinogen: 0.1 EU/dL — ABNORMAL LOW (ref 0.2–1.0)
pH (UA): 6 (ref 5.0–8.0)
pH, UA: 6 (ref 5.0–8.0)

## 2020-12-21 LAB — GLUCOSE, POC: Glucose (POC): 242 mg/dL — ABNORMAL HIGH (ref 65–100)

## 2020-12-21 LAB — LACTIC ACID
Lactic Acid: 4 mmol/L (ref 0.4–2.0)
Lactic Acid: 3.9 mmol/L (ref 0.4–2.0)
Lactic Acid: 0.7 mmol/L (ref 0.4–2.0)
Lactic acid: 4 mmol/L — CR (ref 0.4–2.0)
Lactic acid: 3.9 mmol/L — CR (ref 0.4–2.0)
Lactic acid: 0.7 mmol/L (ref 0.4–2.0)

## 2020-12-21 LAB — LIPASE
Lipase: 237 U/L (ref 73–393)
Lipase: 237 U/L (ref 73–393)

## 2020-12-21 LAB — CBC WITH AUTO DIFFERENTIAL
Basophils %: 1 % (ref 0–1)
Basophils Absolute: 0.1 10*3/uL (ref 0.0–0.1)
Eosinophils %: 0 % (ref 0–7)
Eosinophils Absolute: 0 10*3/uL (ref 0.0–0.4)
Granulocyte Absolute Count: 0 10*3/uL (ref 0.00–0.04)
Hematocrit: 41.2 % (ref 36.6–50.3)
Hemoglobin: 14.1 g/dL (ref 12.1–17.0)
Immature Granulocytes: 0 % (ref 0.0–0.5)
Lymphocytes %: 45 % (ref 12–49)
Lymphocytes Absolute: 3.2 10*3/uL (ref 0.8–3.5)
MCH: 28.2 PG (ref 26.0–34.0)
MCHC: 34.2 g/dL (ref 30.0–36.5)
MCV: 82.4 FL (ref 80.0–99.0)
MPV: 10.2 FL (ref 8.9–12.9)
Monocytes %: 9 % (ref 5–13)
Monocytes Absolute: 0.7 10*3/uL (ref 0.0–1.0)
Neutrophils %: 45 % (ref 32–75)
Neutrophils Absolute: 3.3 10*3/uL (ref 1.8–8.0)
Platelets: 375 10*3/uL (ref 150–400)
RBC: 5 M/uL (ref 4.10–5.70)
RDW: 13.9 % (ref 11.5–14.5)
WBC: 7.3 10*3/uL (ref 4.1–11.1)

## 2020-12-21 LAB — COMPREHENSIVE METABOLIC PANEL
ALT: 55 U/L (ref 12–78)
AST: 38 U/L — ABNORMAL HIGH (ref 15–37)
Albumin/Globulin Ratio: 0.8 — ABNORMAL LOW (ref 1.1–2.2)
Albumin: 3.9 g/dL (ref 3.5–5.0)
Alkaline Phosphatase: 88 U/L (ref 45–117)
Anion Gap: 16 mmol/L — ABNORMAL HIGH (ref 5–15)
BUN: 13 mg/dL (ref 6–20)
Bun/Cre Ratio: 9 — ABNORMAL LOW (ref 12–20)
CO2: 24 mmol/L (ref 21–32)
Calcium: 9.1 mg/dL (ref 8.5–10.1)
Chloride: 84 mmol/L — ABNORMAL LOW (ref 97–108)
Creatinine: 1.4 mg/dL — ABNORMAL HIGH (ref 0.70–1.30)
ESTIMATED GLOMERULAR FILTRATION RATE: 58 mL/min/{1.73_m2} — ABNORMAL LOW (ref >60)
Globulin: 4.7 g/dL — ABNORMAL HIGH (ref 2.0–4.0)
Glucose: 449 mg/dL — ABNORMAL HIGH (ref 65–100)
Potassium: 4 mmol/L (ref 3.5–5.1)
Sodium: 124 mmol/L — ABNORMAL LOW (ref 136–145)
Total Bilirubin: 0.3 mg/dL (ref 0.2–1.0)
Total Protein: 8.6 g/dL — ABNORMAL HIGH (ref 6.4–8.2)

## 2020-12-21 LAB — POCT GLUCOSE: POC Glucose: 242 mg/dL — ABNORMAL HIGH (ref 65–100)

## 2020-12-21 MED ORDER — MORPHINE 4 MG/ML INTRAVENOUS SOLUTION
4 mg/mL | INTRAVENOUS | Status: AC
Start: 2020-12-21 — End: 2020-12-21
  Administered 2020-12-21: 19:00:00 via INTRAVENOUS

## 2020-12-21 MED ORDER — INSULIN REGULAR HUMAN 100 UNIT/ML INJECTION
100 unit/mL | INTRAMUSCULAR | Status: AC
Start: 2020-12-21 — End: 2020-12-21
  Administered 2020-12-21: 19:00:00 via INTRAVENOUS

## 2020-12-21 MED ORDER — MORPHINE 4 MG/ML INTRAVENOUS SOLUTION
4 mg/mL | INTRAVENOUS | Status: AC
Start: 2020-12-21 — End: 2020-12-21
  Administered 2020-12-21: 23:00:00 via INTRAVENOUS

## 2020-12-21 MED ORDER — IOPAMIDOL 76 % IV SOLN
370 mg iodine /mL (76 %) | Freq: Once | INTRAVENOUS | Status: AC
Start: 2020-12-21 — End: 2020-12-21
  Administered 2020-12-21: 21:00:00 via INTRAVENOUS

## 2020-12-21 MED ORDER — MORPHINE 4 MG/ML INTRAVENOUS SOLUTION
4 mg/mL | INTRAVENOUS | Status: AC
Start: 2020-12-21 — End: 2020-12-21
  Administered 2020-12-21: 20:00:00 via INTRAVENOUS

## 2020-12-21 MED ORDER — KETOROLAC TROMETHAMINE 15 MG/ML INJECTION
15 mg/mL | INTRAMUSCULAR | Status: AC
Start: 2020-12-21 — End: 2020-12-21
  Administered 2020-12-21: 18:00:00 via INTRAVENOUS

## 2020-12-21 MED ORDER — SODIUM CHLORIDE 0.9% BOLUS IV
0.9 % | Freq: Once | INTRAVENOUS | Status: AC
Start: 2020-12-21 — End: 2020-12-21
  Administered 2020-12-21: 22:00:00 via INTRAVENOUS

## 2020-12-21 MED ORDER — SODIUM CHLORIDE 0.9% BOLUS IV
0.9 % | Freq: Once | INTRAVENOUS | Status: AC
Start: 2020-12-21 — End: 2020-12-21
  Administered 2020-12-21: 18:00:00 via INTRAVENOUS

## 2020-12-21 MED FILL — ISOVUE-370  76 % INTRAVENOUS SOLUTION: 370 mg iodine /mL (76 %) | INTRAVENOUS | Qty: 100

## 2020-12-21 MED FILL — HUMULIN R REGULAR U-100 INSULIN 100 UNIT/ML INJECTION SOLUTION: 100 unit/mL | INTRAMUSCULAR | Qty: 10

## 2020-12-21 MED FILL — SODIUM CHLORIDE 0.9 % IV: INTRAVENOUS | Qty: 1000

## 2020-12-21 MED FILL — KETOROLAC TROMETHAMINE 15 MG/ML INJECTION: 15 mg/mL | INTRAMUSCULAR | Qty: 1

## 2020-12-21 MED FILL — MORPHINE 4 MG/ML SYRINGE: 4 mg/mL | INTRAMUSCULAR | Qty: 1

## 2020-12-21 NOTE — ED Notes (Signed)
Pt straight stuck for lactic acid at this time.

## 2020-12-21 NOTE — ED Notes (Signed)
Left flank pain x 2 days, gradual onset, burning with urination

## 2020-12-21 NOTE — ED Provider Notes (Signed)
ED Provider Notes by Loni Beckwith, MD at 12/21/20 1752                Author: Loni Beckwith, MD  Service: Emergency Medicine  Author Type: Physician       Filed: 12/26/20 2356  Date of Service: 12/21/20 1752  Status: Signed          Editor: Loni Beckwith, MD (Physician)               EMERGENCY DEPARTMENT HISTORY AND PHYSICAL EXAM           Date: 12/21/2020   Patient Name: Antonio Bast Sr.        History of Presenting Illness          Chief Complaint       Patient presents with        ?  Flank Pain           History Provided By: Patient and Patient's Wife      HPI: Antonio RENFREW Sr., 60 y.o. male with a past medical history significant for pancreatitis, alcohol use, chronic pain, diabetes presents to  the ED with cc of LLQ pain. He does endorse ongoing alcohol abuse stating it is "football season." He did not drink today. Pain is sharp, LLQ and non-radiating. He denies NVD, fevers, chills. Pt has been in and out of hospital for above this year. He  was at OSH two weeks ago but pain was in diff location.       There are no other complaints, changes, or physical findings at this time.      PCP: Other, Phys, MD        Current Facility-Administered Medications             Medication  Dose  Route  Frequency  Provider  Last Rate  Last Admin              ?  sodium chloride 0.9 % bolus infusion 1,000 mL   1,000 mL  IntraVENous  Caswell Corwin, MD  1,000 mL/hr at 12/21/20 1738  1,000 mL at 12/21/20 1738          Current Outpatient Medications          Medication  Sig  Dispense  Refill           ?  naproxen (NAPROSYN) 500 mg tablet  Take 1 Tablet by mouth every twelve (12) hours as needed for Pain.  20 Tablet  0     ?  amLODIPine (NORVASC) 10 mg tablet  Take 1 Tablet by mouth daily.  30 Tablet  3     ?  metFORMIN ER (GLUCOPHAGE XR) 500 mg tablet  Take 500 mg by mouth daily.         ?  metoprolol succinate (TOPROL-XL) 25 mg XL tablet  Take 25 mg by mouth daily.         ?  chlordiazePOXIDE (LIBRIUM) 10 mg  capsule  Take 1 Capsule by mouth three (3) times daily. Max Daily Amount: 30 mg. (Patient not taking: Reported on 08/18/2020)  21 Capsule  1     ?  sucralfate (CARAFATE) 1 gram tablet  Take 1 Tablet by mouth Before breakfast, lunch, dinner and at bedtime. (Patient not taking: Reported on 08/18/2020)  21 Tablet  1     ?  SITagliptin (Januvia) 50 mg tablet  Take 50 mg by mouth daily. (Patient not taking: Reported on 08/18/2020)         ?  insulin regular (NOVOLIN R, HUMULIN R) 100 unit/mL injection  by SubCUTAneous route. On a scale insulin (Patient not taking: Reported on 08/18/2020)         ?  omeprazole (PRILOSEC) 40 mg capsule  Take 40 mg by mouth daily.         ?  ramipriL (ALTACE) 10 mg capsule  Take 10 mg by mouth daily.         ?  metoprolol tartrate (LOPRESSOR) 25 mg tablet  Take 25 mg by mouth two (2) times a day.         ?  rosuvastatin (Crestor) 40 mg tablet  Take 40 mg by mouth nightly.         ?  DULoxetine (CYMBALTA) 60 mg capsule  Take  by mouth.               ?  insulin nph-regular human rec (NovoLIN 70-30 FlexPen U-100) 100 unit/mL (70-30) inpn  Inject 48 units qAM, 28 units qhs  2 Each  0             Past History        Past Medical History:     Past Medical History:        Diagnosis  Date         ?  Diabetes ([place])       ?  High cholesterol           ?  Hypertension             Past Surgical History:     Past Surgical History:         Procedure  Laterality  Date          ?  HX CHOLECYSTECTOMY               Family History:     Family History         Problem  Relation  Age of Onset          ?  Hypertension  Mother             Social History:     Social History          Tobacco Use         ?  Smoking status:  Never     ?  Smokeless tobacco:  Never       Vaping Use         ?  Vaping Use:  Never used       Substance Use Topics         ?  Alcohol use:  Yes              Alcohol/week:  74.0 standard drinks         Types:  74 Cans of beer per week         ?  Drug use:  Never           Allergies:   No Known  Allergies           Review of Systems     Review of Systems    Constitutional:  Negative for chills and fever.    HENT: Negative.  Negative for congestion, rhinorrhea, sneezing and sore throat.     Eyes: Negative.  Negative for redness and visual disturbance.    Respiratory: Negative.  Negative for cough, shortness of breath and wheezing.     Cardiovascular: Negative.  Negative for chest pain  and leg swelling.    Gastrointestinal:  Positive for abdominal pain. Negative for diarrhea, nausea and vomiting.    Genitourinary:  Positive for dysuria and flank pain . Negative for difficulty urinating, frequency and penile discharge.    Musculoskeletal:  Negative for arthralgias, back pain, myalgias and neck stiffness.    Skin: Negative.  Negative for color change and rash.    Neurological: Negative.  Negative for dizziness, syncope, weakness, numbness and headaches.    Hematological:  Negative for adenopathy.    Psychiatric/Behavioral: Negative.      All other systems reviewed and are negative.        Physical Exam     Physical Exam   Vitals and nursing note reviewed.    Constitutional:        General: He is not in acute distress.   HENT:       Head: Atraumatic.    Eyes:       Conjunctiva/sclera: Conjunctivae normal.       Pupils: Pupils are equal, round, and reactive to light.     Cardiovascular:       Rate and Rhythm: Regular rhythm. Tachycardia present.       Heart sounds: Normal heart sounds.    Pulmonary:       Effort: Pulmonary effort is normal.       Breath sounds: Normal breath sounds.     Abdominal:       General: Bowel sounds are normal. There is no distension.       Palpations: Abdomen is soft. There is no mass.       Tenderness: There is abdominal tenderness.     Musculoskeletal:          General: Normal range of motion.       Cervical back: Normal range of motion.    Skin:      General: Skin is warm.    Neurological:       Mental Status: He is alert and oriented to person, place, and time.       Cranial Nerves:  No cranial nerve deficit.            Diagnostic Study Results        Labs -         Recent Results (from the past 12 hour(s))     URINALYSIS W/ REFLEX CULTURE          Collection Time: 12/21/20  1:52 PM       Specimen: Urine         Result  Value  Ref Range            Color  Yellow          Appearance  Clear  Clear         Specific gravity  1.005  1.003 - 1.030         pH (UA)  6.0  5.0 - 8.0         Protein  Negative  Negative mg/dL       Glucose  >1,000 (A)  Negative mg/dL       Ketone  Negative  Negative mg/dL       Bilirubin  Negative  Negative         Blood  Negative  Negative         Urobilinogen  0.1 (L)  0.2 - 1.0 EU/dL       Nitrites  Negative  Negative  Leukocyte Esterase  Negative  Negative         WBC  0-4  0 - 4 /hpf       RBC  0-5  0 - 5 /hpf       Bacteria  Negative  Negative /hpf       UA:UC IF INDICATED  Culture not indicated by UA result  Culture not indicated by UA result         CBC WITH AUTOMATED DIFF          Collection Time: 12/21/20  2:00 PM         Result  Value  Ref Range            WBC  7.3  4.1 - 11.1 K/uL       RBC  5.00  4.10 - 5.70 M/uL       HGB  14.1  12.1 - 17.0 g/dL       HCT  41.2  36.6 - 50.3 %       MCV  82.4  80.0 - 99.0 FL       MCH  28.2  26.0 - 34.0 PG       MCHC  34.2  30.0 - 36.5 g/dL       RDW  13.9  11.5 - 14.5 %       PLATELET  375  150 - 400 K/uL       MPV  10.2  8.9 - 12.9 FL       NEUTROPHILS  45  32 - 75 %       LYMPHOCYTES  45  12 - 49 %       MONOCYTES  9  5 - 13 %       EOSINOPHILS  0  0 - 7 %       BASOPHILS  1  0 - 1 %       IMMATURE GRANULOCYTES  0  0.0 - 0.5 %       ABS. NEUTROPHILS  3.3  1.8 - 8.0 K/UL       ABS. LYMPHOCYTES  3.2  0.8 - 3.5 K/UL       ABS. MONOCYTES  0.7  0.0 - 1.0 K/UL       ABS. EOSINOPHILS  0.0  0.0 - 0.4 K/UL       ABS. BASOPHILS  0.1  0.0 - 0.1 K/UL       ABS. IMM. GRANS.  0.0  0.00 - 0.04 K/UL       DF  AUTOMATED          METABOLIC PANEL, COMPREHENSIVE          Collection Time: 12/21/20  2:00 PM         Result  Value  Ref Range             Sodium  124 (L)  136 - 145 mmol/L       Potassium  4.0  3.5 - 5.1 mmol/L       Chloride  84 (L)  97 - 108 mmol/L       CO2  24  21 - 32 mmol/L       Anion gap  16 (H)  5 - 15 mmol/L       Glucose  449 (H)  65 - 100 mg/dL       BUN  13  6 - 20 mg/dL       Creatinine  1.40 (H)  0.70 - 1.30 mg/dL       BUN/Creatinine ratio  9 (L)  12 - 20         eGFR  58 (L)  >60 ml/min/1.69m       Calcium  9.1  8.5 - 10.1 mg/dL       Bilirubin, total  0.3  0.2 - 1.0 mg/dL       AST (SGOT)  38 (H)  15 - 37 U/L       ALT (SGPT)  55  12 - 78 U/L       Alk. phosphatase  88  45 - 117 U/L       Protein, total  8.6 (H)  6.4 - 8.2 g/dL       Albumin  3.9  3.5 - 5.0 g/dL       Globulin  4.7 (H)  2.0 - 4.0 g/dL       A-G Ratio  0.8 (L)  1.1 - 2.2         LIPASE          Collection Time: 12/21/20  3:15 PM         Result  Value  Ref Range            Lipase  237  73 - 393 U/L       LACTIC ACID          Collection Time: 12/21/20  3:38 PM         Result  Value  Ref Range            Lactic acid  4.0 (HH)  0.4 - 2.0 mmol/L       LACTIC ACID          Collection Time: 12/21/20  4:15 PM         Result  Value  Ref Range            Lactic acid  3.9 (HH)  0.4 - 2.0 mmol/L           Radiologic Studies -      CT ABD PELV W CONT       Final Result     Mild bilateral hydroureter is not changed. There is no significant     hydronephrosis.     Hepatic steatosis and cholecystectomy.     There is no pancreatic mass.      Please see above for additional nonemergent incidental findings.                 CT ABD PELV WO CONT       Final Result          1. Circumferential urinary bladder wall thickening, which may represent acute or     chronic cystitis. Correlate with urinalysis.     2. Minimal bilateral hydroureter without hydronephrosis.     3. Hepatic steatosis.                      CT Results  (Last 48 hours)                                       12/21/20 1656    CT ABD PELV W CONT  Final result            Impression:    Mild bilateral hydroureter is not  changed.  There is no significant      hydronephrosis.      Hepatic steatosis and cholecystectomy.      There is no pancreatic mass.       Please see above for additional nonemergent incidental findings.                         Narrative:           CLINICAL HISTORY: abdominal pain LLQ, pancreatic mass      INDICATION: abdominal pain LLQ, pancreatic mass      COMPARISON: None.      CONTRAST: 100  ml Isovue 370      TECHNIQUE:       Multislice helical CT was performed of the abdomen and pelvis following      uneventful rapid bolus intravenous contrast administration.  Oral contrast was      not administered. Contiguous 5 mm axial images were reconstructed and lung and      soft tissue windows were generated. Coronal and sagittal reformations were      generated.  CT dose reduction was achieved through use of a standardized      protocol tailored for this examination and automatic exposure control for dose      modulation.               FINDINGS:      LUNG BASES:Small hiatal hernia       LIVER: Hepatic steatosis There is no intrahepatic duct dilatation. There is no      hepatic parenchymal mass. Hepatic enhancement pattern is within normal limits.      Portal vein is patent.      GALLBLADDER:  Cholecystectomy       SPLEEN/PANCREAS: No mass.  There is no pancreatic duct dilatation. There is no      evidence of splenomegaly.      ADRENALS/KIDNEYS: Scattered hypodensities are too small to characterize. Mild      bilateral hydroureter is not changed. There is no renal mass. There is no      perinephric mass.      STOMACH: No dilatation or wall thickening.       COLON AND SMALL BOWEL: Fecal stasis. There is no free intraperitoneal air. There      is no evidence of incarceration or obstruction. No mesenteric adenopathy.      PERITONEUM: Unremarkable.      APPENDIX: Unremarkable.      BLADDER/REPRODUCTIVE ORGANS: . Circumferential bladder wall thickening is      minimal/trace. Nonspecific. RETROPERITONEUM: Unremarkable. The  abdominal aorta      is normal in caliber. No aneurysm. No retroperitoneal adenopathy.      OSSEOUS STRUCTURES: No destructive bone lesion.                          12/21/20 1424    CT ABD PELV WO CONT  Final result            Impression:           1. Circumferential urinary bladder wall thickening, which may represent acute or      chronic cystitis. Correlate with urinalysis.      2. Minimal bilateral hydroureter without hydronephrosis.      3. Hepatic steatosis.  Narrative:    EXAM:  CT ABD PELV WO CONT             INDICATION: left flank pain             COMPARISON: CT abdomen pelvis 08/22/2020.             CONTRAST:  None.             TECHNIQUE:       Thin axial images were obtained through the abdomen and pelvis. Coronal and      sagittal reconstructions were generated. Oral contrast was not administered. CT      dose reduction was achieved through use of a standardized protocol tailored for      this examination and automatic exposure control for dose modulation.              FINDINGS:       Lower Thorax:      Lung Bases: Clear.             Heart: The heart is normal in size. No pericardial effusion.             Abdomen/Pelvis:      Evaluation of the solid organs is markedly limited without the use of IV      contrast.             Liver:  No focal liver lesions. Diffuse hepatic steatosis.             Biliary system: Gallbladder is surgically absent.             Spleen: Normal.             Pancreas: Normal.             Kidneys/Ureters/Bladder: No renal masses. No renal or ureteral calculi. There is      minimal bilateral hydroureter without hydronephrosis. There is circumferential      urinary bladder wall thickening.             Adrenals: Normal.             Stomach/bowel: No dilation or abnormal wall thickening is present. No free      intraperitoneal air noted. Normal appendix.             Reproductive Organs: Prostate is mildly enlarged.             Vasculature: Normal caliber  arteries. Mild calcific atherosclerosis of the      abdominal aorta. Nodes: No pathologically enlarged lymph nodes.             Fluid: No free fluid.             Bones/Soft Tissue: No acute fractures or aggressive osseous lesions are seen.                                      CXR Results  (Last 48 hours)             None                  '@SCANS' @        Medical Decision Making and ED Course     I am the first provider for this patient.      I reviewed the vital signs, available nursing notes, past medical history, past surgical history, family history and social history.  Vital Signs-Reviewed the patient's vital signs.   Patient Vitals for the past 12 hrs:            Temp  Pulse  Resp  BP  SpO2            12/21/20 1345  98.5 ??F (36.9 ??C)  (!) 122  20  137/83  99 %           Records Reviewed: Nursing Notes and Old Medical Records      Provider Notes (Medical Decision Making):    UTI, cystitis, kidney stone, diverticulitis, colitis, pancreatitis, pancreatic mass, CBD obstruction, gall stones in setting of known alcohol abuse per chart review. In turn pt non-compliant with diabetic  management may be suffering from hyperglycemia as well as CKD.      REVIEWED OSH RECORDS FROM 08/17 at [facility]: Pt diagnosed with pancreatitis, possible pancreatic mass, alcohol abuse, uncontrolled diabetes.      Will obtain labs and CT. Pt has h/o alcohol abuse and recurrent pancreatitis. Report of pancreatic mass inconsistent in his records as his recent hospitalization in [facility] did not show this in June 2022.            ED Course:    Initial assessment performed. The patients presenting problems have been discussed, and they are in agreement with the care plan formulated and outlined with them.  I have encouraged them to ask questions as they arise throughout their visit.             Progress note (1400): Pt had improved pain for about 90 minutes before requesting additional medication  which was given.   Progress note: 17:30- Pt  requesting pain medication. Will hold and reassess closer to discharge. Discuss pain management with pt to which he endorsed being amenable.   Progress note: Pts repeat lactate shows downward trend. Will complete second bolus and repet Blood glucose.     Progress note: Prior to discharge, had long conversation with pt and pt's wife and alcohol abuse, chronic pain management, need for  improved diabetic control. They both endorsed understanding.        Procedures           Loni Beckwith, MD         ALCOHOL/SUBSTANCE ABUSE COUNSELING: Upon evaluation, pt endorsed recent alcohol/illicit drug use. For approximately 15 minutes, pt has been counseled on the dangers of alcohol and illicit drug use  on their health, and they were encouraged to quit as soon as possible in order to decrease further risks to their health. Pt has conveyed their understanding of the risks involved should they continue to use these products.         CRITICAL CARE NOTE :   5:52 PM   Amount of Critical Care Time: _112___(minutes)_ _      IMPENDING DETERIORATION -Cardiovascular and Metabolic   ASSOCIATED RISK FACTORS - Dysrhythmia and Metabolic changes   MANAGEMENT- Bedside Assessment and Supervision of Care   INTERPRETATION -  CT Scan and Blood Pressure   INTERVENTIONS - hemodynamic mngmt and Metobolic interventions   CASE REVIEW - Nursing and Family   TREATMENT RESPONSE -Stable   PERFORMED BY - Self      NOTES   :   I have spent critical care time involved in lab review, consultations with specialist, family decision- making, bedside attention and documentation. This time excludes time spent in any separate billed  procedures.  During this entire length of time I was immediately available  to the patient .      Loni Beckwith, MD              Disposition        DISCHARGE PLAN:   1.      Current Discharge Medication List                 CONTINUE these medications which have NOT CHANGED          Details        naproxen (NAPROSYN) 500 mg tablet  Take 1  Tablet by mouth every twelve (12) hours as needed for Pain.   Qty: 20 Tablet, Refills: 0               amLODIPine (NORVASC) 10 mg tablet  Take 1 Tablet by mouth daily.   Qty: 30 Tablet, Refills: 3               metFORMIN ER (GLUCOPHAGE XR) 500 mg tablet  Take 500 mg by mouth daily.               metoprolol succinate (TOPROL-XL) 25 mg XL tablet  Take 25 mg by mouth daily.               chlordiazePOXIDE (LIBRIUM) 10 mg capsule  Take 1 Capsule by mouth three (3) times daily. Max Daily Amount: 30 mg.   Qty: 21 Capsule, Refills: 1          Associated Diagnoses: Alcohol withdrawal syndrome with complication (HCC)               sucralfate (CARAFATE) 1 gram tablet  Take 1 Tablet by mouth Before breakfast, lunch, dinner and at bedtime.   Qty: 21 Tablet, Refills: 1               SITagliptin (Januvia) 50 mg tablet  Take 50 mg by mouth daily.               insulin regular (NOVOLIN R, HUMULIN R) 100 unit/mL injection  by SubCUTAneous route. On a scale insulin               omeprazole (PRILOSEC) 40 mg capsule  Take 40 mg by mouth daily.               ramipriL (ALTACE) 10 mg capsule  Take 10 mg by mouth daily.               metoprolol tartrate (LOPRESSOR) 25 mg tablet  Take 25 mg by mouth two (2) times a day.               rosuvastatin (Crestor) 40 mg tablet  Take 40 mg by mouth nightly.               DULoxetine (CYMBALTA) 60 mg capsule  Take  by mouth.               insulin nph-regular human rec (NovoLIN 70-30 FlexPen U-100) 100 unit/mL (70-30) inpn  Inject 48 units qAM, 28 units qhs   Qty: 2 Each, Refills: 0                      2.      Follow-up Information      None             3.  Return to ED if worse         Diagnosis  Clinical Impression:              ICD-10-CM  ICD-9-CM             1.  Abdominal pain, LLQ (left lower quadrant)   R10.32  789.04                    2.  Bladder wall thickening   N32.89  596.89                    3.  Alcohol abuse   F10.10  305.00                    4.  Other chronic pain   G89.29   338.29                    5.  Uncontrolled type 1 diabetes mellitus with hyperglycemia (HCC)   E10.65  250.83                790.29                    6.  Lactic acidosis   E87.20  276.2                    7.  Noncompliance with medications   Z91.14  V15.81                    Attestations:      Loni Beckwith, MD      Please note that this dictation was completed with Dragon, the computer voice recognition software.  Quite often unanticipated grammatical, syntax, homophones, and other interpretive errors are inadvertently  transcribed by the computer software.  Please disregard these errors.  Please excuse any errors that have escaped final proofreading.  Thank you.

## 2021-03-02 ENCOUNTER — Ambulatory Visit: Attending: "Endocrinology | Primary: Family Medicine

## 2021-04-16 ENCOUNTER — Emergency Department: Admit: 2021-04-17 | Payer: MEDICAID | Primary: Family Medicine

## 2021-04-16 ENCOUNTER — Emergency Department

## 2021-04-16 DIAGNOSIS — R079 Chest pain, unspecified: Secondary | ICD-10-CM

## 2021-04-16 NOTE — ED Notes (Signed)
 Pt arrives via EMS from home c/o SOB. EMS reports the pt denies any recent trauma or any respiratory history. Pt states he just woke up 3 days ago with difficulty breathing. Pt is a type 2 diabetic and has not taken any of his medications in a couple of days due to leaving them at home in [place] , sugar for ems 536.

## 2021-04-16 NOTE — ED Provider Notes (Signed)
ED Provider Notes by Marcelyn BruinsZelensky, Girtha Kilgore K, MD at 04/16/21 2138                Author: Marcelyn BruinsZelensky, Citlally Captain K, MD  Service: Emergency Medicine  Author Type: Physician       Filed: 04/17/21 1031  Date of Service: 04/16/21 2138  Status: Signed          Editor: Marcelyn BruinsZelensky, Kimbella Heisler K, MD (Physician)               [facility]   EMERGENCY DEPARTMENT HISTORY AND PHYSICAL EXAM           Date: 04/16/2021   Patient Name: Antonio DouglasRonald D Berrong Sr.   MRN: [id number]   Birthdate: 12/19/1960   Date of evaluation: 04/16/2021   Provider: Marcelyn BruinsPaul K Cimberly Stoffel, MD    Note Started: 9:38 PM 04/16/21        HISTORY OF PRESENT ILLNESS          Chief Complaint       Patient presents with        ?  Shortness of Breath        ?  Chest Pain           History Provided By: Patient      HPI: Antonio Douglasonald D Belvedere Sr., 61 y.o. male presents emergency department for chest pain, hiccups.  Patient states over the last 2 days he has had hiccups,  states hiccups have exacerbated chest pain described as left-sided substernal pain with radiation to back.  Worse with deep inspiration.  Patient denies any nausea or vomiting, denies any fevers chills.  Patient has no known cardiac disease.        PAST MEDICAL HISTORY     Past Medical History:     Past Medical History:        Diagnosis  Date         ?  Diabetes (HCC)       ?  High cholesterol           ?  Hypertension             Past Surgical History:     Past Surgical History:         Procedure  Laterality  Date          ?  HX CHOLECYSTECTOMY               Family History:     Family History         Problem  Relation  Age of Onset          ?  Hypertension  Mother             Social History:     Social History          Tobacco Use         ?  Smoking status:  Never     ?  Smokeless tobacco:  Never       Vaping Use         ?  Vaping Use:  Never used       Substance Use Topics         ?  Alcohol use:  Yes              Alcohol/week:  74.0 standard drinks         Types:  74 Cans of beer per week         ?  Drug use:  Never            Allergies:   No Known Allergies      PCP: Other, Phys, MD      Current Meds:      Discharge Medication List as of 04/17/2021  3:17 AM                 CONTINUE these medications which have NOT CHANGED          Details        pregabalin (LYRICA) 50 mg capsule  Take 50 mg by mouth two (2) times a day., Historical Med               atorvastatin (LIPITOR) 80 mg tablet  Take 80 mg by mouth daily., Historical Med               metoprolol succinate (TOPROL-XL) 100 mg tablet  Take 100 mg by mouth daily., Historical Med               naproxen (NAPROSYN) 500 mg tablet  Take 1 Tablet by mouth every twelve (12) hours as needed for Pain., Normal, Disp-20 Tablet, R-0               amLODIPine (NORVASC) 10 mg tablet  Take 1 Tablet by mouth daily., Normal, Disp-30 Tablet, R-3               metFORMIN ER (GLUCOPHAGE XR) 500 mg tablet  Take 500 mg by mouth daily., Historical Med               chlordiazePOXIDE (LIBRIUM) 10 mg capsule  Take 1 Capsule by mouth three (3) times daily. Max Daily Amount: 30 mg., Normal, Disp-21 Capsule, R-1               sucralfate (CARAFATE) 1 gram tablet  Take 1 Tablet by mouth Before breakfast, lunch, dinner and at bedtime., Normal, Disp-21 Tablet, R-1               SITagliptin (Januvia) 50 mg tablet  Take 50 mg by mouth daily., Historical Med               insulin regular (NOVOLIN R, HUMULIN R) 100 unit/mL injection  by SubCUTAneous route. On a scale insulin, Historical Med               omeprazole (PRILOSEC) 40 mg capsule  Take 40 mg by mouth daily., Historical Med               DULoxetine (CYMBALTA) 60 mg capsule  Take  by mouth., Historical Med               insulin nph-regular human rec (NovoLIN 70-30 FlexPen U-100) 100 unit/mL (70-30) inpn  Inject 48 units qAM, 28 units qhs, Normal, Disp-2 Each, R-0                           REVIEW OF SYSTEMS     Review of Systems    Constitutional:  Negative for activity change, chills and fever.    HENT:  Negative for congestion and sore throat.     Eyes:  Negative  for photophobia and visual disturbance.    Respiratory:  Positive for chest tightness. Negative for apnea and shortness of breath.     Cardiovascular:  Positive for chest pain. Negative for palpitations and leg swelling.    Gastrointestinal:  Negative for abdominal pain,  blood in stool, nausea and vomiting.    Genitourinary:  Negative for dysuria.    Musculoskeletal:  Negative for arthralgias and back pain.    Skin:  Negative for color change.    Neurological:  Negative for dizziness, weakness, numbness and headaches.    Positives and Pertinent negatives as per HPI.        PHYSICAL EXAM          ED Triage Vitals [04/16/21 2027]     ED Encounter Vitals Group           BP  (!) 147/110        Pulse (Heart Rate)  (!) 125        Resp Rate  27        Temp  97.7 ??F (36.5 ??C)        Temp src          O2 Sat (%)  100 %        Weight  187 lb           Height  5\' 9"          Physical Exam   Vitals and nursing note reviewed.    Constitutional:        General: He is not in acute distress.      Appearance: He is well-developed and normal weight. He is not ill-appearing or toxic-appearing.    HENT:       Head: Normocephalic and atraumatic.       Mouth/Throat:       Mouth: Mucous membranes are moist.    Eyes:       General: No scleral icterus.      Extraocular Movements: Extraocular movements intact.     Cardiovascular:       Rate and Rhythm: Normal rate and regular rhythm.       Heart sounds: Normal heart sounds.    Pulmonary:       Effort: Pulmonary effort is normal.       Breath sounds: Normal breath sounds.    Chest:       Chest wall: No mass or tenderness.    Abdominal:       General: Abdomen is flat. Bowel sounds are normal. There is no distension.       Palpations: Abdomen is soft.       Tenderness: There is no abdominal tenderness. There is no right CVA tenderness or left CVA tenderness.     Musculoskeletal:       Right lower leg: No edema.       Left lower leg: No edema.    Skin:      General: Skin is warm and dry.        Capillary Refill: Capillary refill takes less than 2 seconds.    Neurological:       General: No focal deficit present.       Mental Status: He is alert and oriented to person, place, and time.            SCREENINGS                    No data recorded          LAB, EKG AND DIAGNOSTIC RESULTS     Labs:     Recent Results (from the past 12 hour(s))     D DIMER          Collection Time: 04/16/21 11:08 PM  Result  Value  Ref Range            D DIMER  7.79 (H)  <0.50 ug/ml(FEU)           EKG: Initial EKG interpreted by me. Shows sinus tach 124, no ST elevations, normal axis      Radiologic Studies:   Non-plain film images such as CT, Ultrasound and MRI are read by the radiologist. Plain radiographic images are visualized and preliminarily interpreted by the ED Provider with the below findings:            Interpretation per the Radiologist below, if available at the time of this note:   CTA CHEST W OR W WO CONT      Result Date: 04/17/2021   EXAM:  CTA CHEST W OR W WO CONT INDICATION: Elevated d-dimer. Evaluate for pulmonary embolus. COMPARISON: None. TECHNIQUE: Helical thin section chest CT following uneventful intravenous administration of nonionic contrast 100 mL of isovue 370 according  to departmental PE protocol. Coronal and sagittal reformats were performed. 3D post processing was performed.  CT dose reduction was achieved through the use of a standardized protocol tailored for this examination and automatic exposure control for dose  modulation. FINDINGS: This is a good quality study for the evaluation of pulmonary embolism to the first subsegmental arterial level. There is no pulmonary embolism to this level. THYROID: No nodule. MEDIASTINUM: No mass or lymphadenopathy. HILA: No mass  or lymphadenopathy. THORACIC AORTA: Atherosclerotic calcification without aneurysm or dissection. HEART: The heart is normal in size without pericardial effusion. Coronary artery calcifications are noted. ESOPHAGUS: No wall  thickening or dilatation. TRACHEA/BRONCHI:  Patent. PLEURA: No effusion or pneumothorax. LUNGS: No nodule, mass, or airspace disease. UPPER ABDOMEN: Partially imaged. No acute pathology. BONES: No aggressive bone lesion or fracture. Mild degenerative spine change.       No pulmonary embolus or other acute cardiopulmonary process.       XR CHEST PORT      Result Date: 04/16/2021   PORTABLE CHEST RADIOGRAPH/S: 04/16/2021 8:46 PM INDICATION: Chest pain. COMPARISON: None. TECHNIQUE: Portable frontal upright radiograph/s of the chest. FINDINGS: The lungs are clear. The central airways are patent. No pneumothorax or pleural effusion.  Sutural anchors in the right humeral head.       Clear lungs.          PROCEDURES     Unless otherwise noted below, none.   Performed by: Marcelyn Bruins, MD    Procedures          CRITICAL CARE TIME             ED COURSE and DIFFERENTIAL DIAGNOSIS/MDM     Vitals:       Vitals:             04/16/21 2045  04/17/21 0000  04/17/21 0145  04/17/21 0315           BP:  (!) 163/104  124/78  (!) 114/50  116/73     Pulse:  (!) 120  (!) 123  (!) 127  (!) 119     Resp:    20  22  20      Temp:        97.9 ??F (36.6 ??C)     SpO2:  100%  99%  96%  93%     Weight:                   Height:  Patient was given the following medications:     Medications       morphine injection 2 mg (2 mg IntraVENous Given 04/16/21 2127)     famotidine (PF) (PEPCID) 20 mg in 0.9% sodium chloride 10 mL injection (20 mg IntraVENous Given 04/16/21 2127)     metoclopramide HCl (REGLAN) injection 10 mg (10 mg IntraVENous Given 04/16/21 2127)     sodium chloride 0.9 % bolus infusion 1,000 mL (0 mL IntraVENous IV Completed 04/16/21 2341)     ATIVAN 1mg  IV (1 mg IntraVENous Given 04/16/21 2311)     chlorproMAZINE (THORAZINE) tablet 25 mg (25 mg Oral Given 04/17/21 0029)       LORazepam (ATIVAN) 2 mg/mL injection (1 mg IntraVENous Given 04/17/21 0055)       iopamidoL (ISOVUE-370) 370 mg iodine /mL (76 %) injection 100 mL (100  mL IntraVENous Given 04/17/21 0129)           CONSULTS: (Who and What was discussed)   None       Chronic Conditions: dm, htn   Social Determinants affecting Dx or Tx: None   Counseling:        Records Reviewed (source and summary of external notes): Prior medical records      CC/HPI Summary, DDx, ED Course, and Reassessment: 61 year old male, history of diabetes hypertension, presents emergency department for evaluation  of chest pain, hiccups, nausea over the last 2 days.    Pain nonexertional no dyspnea no extremity swelling.    Physical exam shows uncomfortable male, tachycardic 120, otherwise stable vitals, pain nonreproducible, pulses equal in all extremities      We will draw basic lab work, cardiac enzymes, D-dimer, chest x-ray, administer Reglan, Pepcid, morphine for GI symptoms.        ED Course as of 04/17/21 1030       Sun Apr 16, 2021        2244  Patient's lab work resulting, metabolic panel shows mild hyperglycemia 374, anion gap 16 bicarb 23.  Patient's first set of troponin is 6.  I reassessed  patient, still continues to be tachycardic 120, patient states that pain has subsided still complains of mild hiccups.  Will administer Haldol 1 mg IV, 1 L normal saline, given elevated anion gap with tachycardia we will draw ethyl alcohol level.  Are  still pending. [PZ]     2255  Patient's history reviewed [PZ]     2257  Patient's chart reviewed patient has listed Librium prescription, I brought this up with patient who does not recall getting it states he has never been  in alcohol withdrawal, states he drinks alcohol approximately several times a week last drink was 2 days ago, denies any history of withdrawal shakiness.  I have a strong suspicion for alcohol withdrawal, will give patient 1 mg Ativan, IV fluids.  discontinue  haloperidol [PZ]     2325  ALCOHOL(ETHYL),SERUM(!): 227 [PZ]     2325  pts  alcohol 227, which would explain anion gap and mild tachycardia. [PZ]       Mon Apr 17, 2021        0005  D  DIMER(!): 7.79   D-dimer markedly elevated 7.79, will order CTA chest to rule out PE. [PZ]        0040  Received signout on this patient from the evening attending.  Pending CTA for disposition.  I did review his work-up and there are no leukocytosis, anemia,  or acute kidney injury.  Troponin is unremarkable.  D-dimer is elevated and thus pending CTA for disposition.  Disposition is intubated to be discharged if CT is negative. [AA]     0204  CTA CHEST W OR W WO CONT   CTA chest is negative for PE. [AA]     0235  I reviewed the patient's chart and he apparently have history of tachycardia.  I did note that on previous visit.  I did order TSH to rule out hyperthyroidism  as a cause of his tachycardia and TSH was unremarkable.  And is appropriately hydrated.  He appears more lucid than when he presented.  He is appropriate for discharge.  We will refer him to primary care for further work-up and evaluation of his tachycardia.   Otherwise, no other findings today that warrant further work-up in the ED. [AA]              ED Course User Index   [AA] Al Weber CooksHazmi, Ahmed, MD   [PZ] Lysbeth GalasZelensky, Oletha BlendPaul K, MD           Disposition Considerations (Tests not done, Shared Decision Making, Pt Expectation of Test or Treatment.):           FINAL IMPRESSION           1.  Chest pain, unspecified type         2.  Alcohol abuse                DISPOSITION/PLAN     Discharged      Discharge Note: The patient is stable for discharge home. The signs, symptoms, diagnosis, and discharge instructions have been discussed, understanding conveyed, and agreed upon. The patient is to follow up as recommended or return to ER should their  symptoms worsen.        PATIENT REFERRED TO:     Follow-up Information                  Follow up With  Specialties  Details  Why  Contact Info              [facility] EMERGENCY DEPT  Emergency Medicine  Go to   As needed, If symptoms worsen  [address]   [place] [id number]   [phone]              Ruby ColaSaraiya,  Bhavna, MD  Internal Medicine Physician  Schedule an appointment as soon as possible for a visit in 3 days  To establish primary care, For reevaluation, Discuss your visit to the ER  [address]   [address]   [place] [id number]   [phone]                          DISCHARGE MEDICATIONS:     Discharge Medication List as of 04/17/2021  3:17 AM                    DISCONTINUED MEDICATIONS:     Discharge Medication List as of 04/17/2021  3:17 AM                 STOP taking these medications                  ramipriL (ALTACE) 10 mg capsule  Comments:    Reason for Stopping:  metoprolol tartrate (LOPRESSOR) 25 mg tablet  Comments:    Reason for Stopping:                      rosuvastatin (Crestor) 40 mg tablet  Comments:    Reason for Stopping:                             I am the Primary Clinician of Record: Marcelyn Bruins, MD (electronically signed)      (Please note that parts of this dictation were completed with voice recognition software. Quite often unanticipated grammatical, syntax, homophones, and other interpretive errors are inadvertently transcribed  by the computer software. Please disregards these errors. Please excuse any errors that have escaped final proofreading.)

## 2021-04-17 ENCOUNTER — Inpatient Hospital Stay
Admit: 2021-04-17 | Discharge: 2021-04-17 | Disposition: A | Discharge status: Home / Self Care | Payer: MEDICAID | Attending: Emergency Medicine

## 2021-04-17 ENCOUNTER — Emergency Department: Admit: 2021-04-17 | Payer: MEDICAID | Primary: Family Medicine

## 2021-04-17 LAB — TROPONIN-HIGH SENSITIVITY: Troponin-High Sensitivity: 6 ng/L (ref 0–76)

## 2021-04-17 LAB — CBC WITH AUTOMATED DIFF
ABS. BASOPHILS: 0 10*3/uL (ref 0.0–0.1)
ABS. EOSINOPHILS: 0 10*3/uL (ref 0.0–0.4)
ABS. IMM. GRANS.: 0 10*3/uL (ref 0.00–0.04)
ABS. LYMPHOCYTES: 2.8 10*3/uL (ref 0.8–3.5)
ABS. MONOCYTES: 0.4 10*3/uL (ref 0.0–1.0)
ABS. NEUTROPHILS: 2.5 10*3/uL (ref 1.8–8.0)
ABSOLUTE NRBC: 0 10*3/uL (ref 0.00–0.01)
BASOPHILS: 1 % (ref 0–1)
EOSINOPHILS: 0 % (ref 0–7)
HCT: 35.1 % — ABNORMAL LOW (ref 36.6–50.3)
HGB: 12.1 g/dL (ref 12.1–17.0)
IMMATURE GRANULOCYTES: 0 % (ref 0–0.5)
LYMPHOCYTES: 48 % (ref 12–49)
MCH: 27.8 PG (ref 26.0–34.0)
MCHC: 34.5 g/dL (ref 30.0–36.5)
MCV: 80.7 FL (ref 80.0–99.0)
MONOCYTES: 7 % (ref 5–13)
MPV: 10 FL (ref 8.9–12.9)
NEUTROPHILS: 44 % (ref 32–75)
NRBC: 0 PER 100 WBC
PLATELET: 345 10*3/uL (ref 150–400)
RBC: 4.35 M/uL (ref 4.10–5.70)
RDW: 14.2 % (ref 11.5–14.5)
WBC: 5.8 10*3/uL (ref 4.1–11.1)

## 2021-04-17 LAB — METABOLIC PANEL, COMPREHENSIVE
A-G Ratio: 1 — ABNORMAL LOW (ref 1.1–2.2)
ALT (SGPT): 57 U/L (ref 12–78)
AST (SGOT): 62 U/L — ABNORMAL HIGH (ref 15–37)
Albumin: 3.6 g/dL (ref 3.5–5.0)
Alk. phosphatase: 120 U/L — ABNORMAL HIGH (ref 45–117)
Anion gap: 16 mmol/L — ABNORMAL HIGH (ref 5–15)
BUN/Creatinine ratio: 5 — ABNORMAL LOW (ref 12–20)
BUN: 6 mg/dL (ref 6–20)
Bilirubin, total: 0.6 mg/dL (ref 0.2–1.0)
CO2: 23 mmol/L (ref 21–32)
Calcium: 8.8 mg/dL (ref 8.5–10.1)
Chloride: 91 mmol/L — ABNORMAL LOW (ref 97–108)
Creatinine: 1.26 mg/dL (ref 0.70–1.30)
Globulin: 3.6 g/dL (ref 2.0–4.0)
Glucose: 374 mg/dL — ABNORMAL HIGH (ref 65–100)
Potassium: 4.3 mmol/L (ref 3.5–5.1)
Protein, total: 7.2 g/dL (ref 6.4–8.2)
Sodium: 130 mmol/L — ABNORMAL LOW (ref 136–145)
eGFR: >60 mL/min/{1.73_m2} (ref >60)

## 2021-04-17 LAB — EKG, 12 LEAD, INITIAL
Atrial Rate: 124 {beats}/min
Calculated P Axis: 59 degrees
Calculated R Axis: 33 degrees
Calculated T Axis: 33 degrees
P-R Interval: 142 ms
Q-T Interval: 326 ms
QRS Duration: 76 ms
QTC Calculation (Bezet): 468 ms
Ventricular Rate: 124 {beats}/min

## 2021-04-17 LAB — ETHYL ALCOHOL
ALCOHOL(ETHYL),SERUM: 227 mg/dL — ABNORMAL HIGH (ref <10)
Ethyl Alcohol: 227 mg/dL — ABNORMAL HIGH (ref <10)

## 2021-04-17 LAB — TSH 3RD GENERATION
TSH: 2.04 u[IU]/mL (ref 0.36–3.74)
TSH: 2.04 u[IU]/mL (ref 0.36–3.74)

## 2021-04-17 LAB — D DIMER: D DIMER: 7.79 ug/ml(FEU) — ABNORMAL HIGH (ref <0.50)

## 2021-04-17 LAB — COMPREHENSIVE METABOLIC PANEL
ALT: 57 U/L (ref 12–78)
AST: 62 U/L — ABNORMAL HIGH (ref 15–37)
Albumin/Globulin Ratio: 1 — ABNORMAL LOW (ref 1.1–2.2)
Albumin: 3.6 g/dL (ref 3.5–5.0)
Alkaline Phosphatase: 120 U/L — ABNORMAL HIGH (ref 45–117)
Anion Gap: 16 mmol/L — ABNORMAL HIGH (ref 5–15)
BUN: 6 mg/dL (ref 6–20)
Bun/Cre Ratio: 5 — ABNORMAL LOW (ref 12–20)
CO2: 23 mmol/L (ref 21–32)
Calcium: 8.8 mg/dL (ref 8.5–10.1)
Chloride: 91 mmol/L — ABNORMAL LOW (ref 97–108)
Creatinine: 1.26 mg/dL (ref 0.70–1.30)
ESTIMATED GLOMERULAR FILTRATION RATE: >60 mL/min/{1.73_m2} (ref >60)
Globulin: 3.6 g/dL (ref 2.0–4.0)
Glucose: 374 mg/dL — ABNORMAL HIGH (ref 65–100)
Potassium: 4.3 mmol/L (ref 3.5–5.1)
Sodium: 130 mmol/L — ABNORMAL LOW (ref 136–145)
Total Bilirubin: 0.6 mg/dL (ref 0.2–1.0)
Total Protein: 7.2 g/dL (ref 6.4–8.2)

## 2021-04-17 LAB — CBC WITH AUTO DIFFERENTIAL
Basophils %: 1 % (ref 0–1)
Basophils Absolute: 0 10*3/uL (ref 0.0–0.1)
Eosinophils %: 0 % (ref 0–7)
Eosinophils Absolute: 0 10*3/uL (ref 0.0–0.4)
Granulocyte Absolute Count: 0 10*3/uL (ref 0.00–0.04)
Hematocrit: 35.1 % — ABNORMAL LOW (ref 36.6–50.3)
Hemoglobin: 12.1 g/dL (ref 12.1–17.0)
Immature Granulocytes: 0 % (ref 0–0.5)
Lymphocytes %: 48 % (ref 12–49)
Lymphocytes Absolute: 2.8 10*3/uL (ref 0.8–3.5)
MCH: 27.8 PG (ref 26.0–34.0)
MCHC: 34.5 g/dL (ref 30.0–36.5)
MCV: 80.7 FL (ref 80.0–99.0)
MPV: 10 FL (ref 8.9–12.9)
Monocytes %: 7 % (ref 5–13)
Monocytes Absolute: 0.4 10*3/uL (ref 0.0–1.0)
NRBC Absolute: 0 10*3/uL (ref 0.00–0.01)
Neutrophils %: 44 % (ref 32–75)
Neutrophils Absolute: 2.5 10*3/uL (ref 1.8–8.0)
Nucleated RBCs: 0 PER 100 WBC
Platelets: 345 10*3/uL (ref 150–400)
RBC: 4.35 M/uL (ref 4.10–5.70)
RDW: 14.2 % (ref 11.5–14.5)
WBC: 5.8 10*3/uL (ref 4.1–11.1)

## 2021-04-17 LAB — EKG 12-LEAD
Atrial Rate: 124 {beats}/min
P Axis: 59 degrees
P-R Interval: 142 ms
Q-T Interval: 326 ms
QRS Duration: 76 ms
QTc Calculation (Bazett): 468 ms
R Axis: 33 degrees
T Axis: 33 degrees
Ventricular Rate: 124 {beats}/min

## 2021-04-17 LAB — TROPONIN, HIGH SENSITIVITY: Troponin, High Sensitivity: 6 ng/L (ref 0–76)

## 2021-04-17 LAB — D-DIMER, QUANTITATIVE: D-Dimer, Quant: 7.79 ug/ml(FEU) — ABNORMAL HIGH (ref <0.50)

## 2021-04-17 MED ORDER — METOCLOPRAMIDE 5 MG/ML IJ SOLN
5 mg/mL | INTRAMUSCULAR | Status: AC
Start: 2021-04-17 — End: 2021-04-16
  Administered 2021-04-17: 02:00:00 via INTRAVENOUS

## 2021-04-17 MED ORDER — HALOPERIDOL LACTATE 5 MG/ML IJ SOLN
5 mg/mL | Freq: Once | INTRAMUSCULAR | Status: DC
Start: 2021-04-17 — End: 2021-04-16
  Administered 2021-04-17: 04:00:00 via INTRAVENOUS

## 2021-04-17 MED ORDER — SODIUM CHLORIDE 0.9% BOLUS IV
0.9 % | Freq: Once | INTRAVENOUS | Status: AC
Start: 2021-04-17 — End: 2021-04-16
  Administered 2021-04-17: 04:00:00 via INTRAVENOUS

## 2021-04-17 MED ORDER — CHLORPROMAZINE 25 MG TAB
25 mg | Freq: Once | ORAL | Status: AC
Start: 2021-04-17 — End: 2021-04-17
  Administered 2021-04-17: 05:00:00 via ORAL

## 2021-04-17 MED ORDER — MORPHINE 2 MG/ML INJECTION
2 mg/mL | INTRAMUSCULAR | Status: AC
Start: 2021-04-17 — End: 2021-04-16
  Administered 2021-04-17: 02:00:00 via INTRAVENOUS

## 2021-04-17 MED ORDER — FAMOTIDINE (PF) 20 MG/2 ML IV
20 mg/2 mL | INTRAVENOUS | Status: AC
Start: 2021-04-17 — End: 2021-04-16
  Administered 2021-04-17: 02:00:00 via INTRAVENOUS

## 2021-04-17 MED ORDER — LORAZEPAM 2 MG/ML IJ SOLN
2 mg/mL | INTRAMUSCULAR | Status: AC
Start: 2021-04-17 — End: 2021-04-16
  Administered 2021-04-17: 04:00:00 via INTRAVENOUS

## 2021-04-17 MED ORDER — LORAZEPAM 2 MG/ML IJ SOLN
2 mg/mL | INTRAMUSCULAR | Status: AC
Start: 2021-04-17 — End: 2021-04-17
  Administered 2021-04-17: 06:00:00 via INTRAVENOUS

## 2021-04-17 MED ORDER — IOPAMIDOL 76 % IV SOLN
370 mg iodine /mL (76 %) | Freq: Once | INTRAVENOUS | Status: AC
Start: 2021-04-17 — End: 2021-04-17
  Administered 2021-04-17: 06:00:00 via INTRAVENOUS

## 2021-04-17 MED FILL — LORAZEPAM 2 MG/ML IJ SOLN: 2 mg/mL | INTRAMUSCULAR | Qty: 1

## 2021-04-17 MED FILL — FAMOTIDINE (PF) 20 MG/2 ML IV: 20 mg/2 mL | INTRAVENOUS | Qty: 2

## 2021-04-17 MED FILL — MORPHINE 2 MG/ML INJECTION: 2 mg/mL | INTRAMUSCULAR | Qty: 1

## 2021-04-17 MED FILL — CHLORPROMAZINE 25 MG TAB: 25 mg | ORAL | Qty: 1

## 2021-04-17 MED FILL — METOCLOPRAMIDE 5 MG/ML IJ SOLN: 5 mg/mL | INTRAMUSCULAR | Qty: 2

## 2021-04-17 MED FILL — ISOVUE-370  76 % INTRAVENOUS SOLUTION: 370 mg iodine /mL (76 %) | INTRAVENOUS | Qty: 100

## 2021-04-17 MED FILL — SODIUM CHLORIDE 0.9 % IV: INTRAVENOUS | Qty: 1000

## 2021-04-24 ENCOUNTER — Inpatient Hospital Stay
Admit: 2021-04-24 | Discharge: 2021-04-27 | Disposition: A | Discharge status: Home / Self Care | Payer: MEDICAID | Attending: Internal Medicine | Admitting: Internal Medicine

## 2021-04-24 DIAGNOSIS — R112 Nausea with vomiting, unspecified: Secondary | ICD-10-CM

## 2021-04-24 DIAGNOSIS — K852 Alcohol induced acute pancreatitis without necrosis or infection: Principal | ICD-10-CM

## 2021-04-24 LAB — METABOLIC PANEL, COMPREHENSIVE
A-G Ratio: 0.9 — ABNORMAL LOW (ref 1.1–2.2)
ALT (SGPT): 51 U/L (ref 12–78)
AST (SGOT): 35 U/L (ref 15–37)
Albumin: 3.1 g/dL — ABNORMAL LOW (ref 3.5–5.0)
Alk. phosphatase: 162 U/L — ABNORMAL HIGH (ref 45–117)
Anion gap: 13 mmol/L (ref 5–15)
BUN/Creatinine ratio: 5 — ABNORMAL LOW (ref 12–20)
BUN: 7 mg/dL (ref 6–20)
Bilirubin, total: 0.3 mg/dL (ref 0.2–1.0)
CO2: 30 mmol/L (ref 21–32)
Calcium: 8.3 mg/dL — ABNORMAL LOW (ref 8.5–10.1)
Chloride: 84 mmol/L — ABNORMAL LOW (ref 97–108)
Creatinine: 1.3 mg/dL (ref 0.70–1.30)
Globulin: 3.5 g/dL (ref 2.0–4.0)
Glucose: 491 mg/dL — ABNORMAL HIGH (ref 65–100)
Potassium: 2.4 mmol/L — CL (ref 3.5–5.1)
Protein, total: 6.6 g/dL (ref 6.4–8.2)
Sodium: 127 mmol/L — ABNORMAL LOW (ref 136–145)
eGFR: >60 mL/min/{1.73_m2} (ref >60)

## 2021-04-24 LAB — CBC WITH AUTOMATED DIFF
ABS. BASOPHILS: 0 10*3/uL (ref 0.0–0.1)
ABS. EOSINOPHILS: 0 10*3/uL (ref 0.0–0.4)
ABS. IMM. GRANS.: 0 10*3/uL (ref 0.00–0.04)
ABS. LYMPHOCYTES: 2.3 10*3/uL (ref 0.8–3.5)
ABS. MONOCYTES: 0.7 10*3/uL (ref 0.0–1.0)
ABS. NEUTROPHILS: 2.7 10*3/uL (ref 1.8–8.0)
BASOPHILS: 0 % (ref 0–1)
EOSINOPHILS: 0 % (ref 0–7)
HCT: 34.6 % — ABNORMAL LOW (ref 36.6–50.3)
HGB: 11.8 g/dL — ABNORMAL LOW (ref 12.1–17.0)
IMMATURE GRANULOCYTES: 1 % — ABNORMAL HIGH (ref 0.0–0.5)
LYMPHOCYTES: 39 % (ref 12–49)
MCH: 27.7 PG (ref 26.0–34.0)
MCHC: 34.1 g/dL (ref 30.0–36.5)
MCV: 81.2 FL (ref 80.0–99.0)
MONOCYTES: 12 % (ref 5–13)
MPV: 9.3 FL (ref 8.9–12.9)
NEUTROPHILS: 48 % (ref 32–75)
PLATELET: 303 10*3/uL (ref 150–400)
RBC: 4.26 M/uL (ref 4.10–5.70)
RDW: 14 % (ref 11.5–14.5)
WBC: 5.7 10*3/uL (ref 4.1–11.1)

## 2021-04-24 LAB — GLUCOSE, POC
Glucose (POC): 374 mg/dL — ABNORMAL HIGH (ref 65–100)
Glucose (POC): 302 mg/dL — ABNORMAL HIGH (ref 65–100)
Glucose (POC): 210 mg/dL — ABNORMAL HIGH (ref 65–100)

## 2021-04-24 LAB — MAGNESIUM
Magnesium: 1.4 mg/dL — ABNORMAL LOW (ref 1.6–2.4)
Magnesium: 1.4 mg/dL — ABNORMAL LOW (ref 1.6–2.4)

## 2021-04-24 LAB — ETHYL ALCOHOL
ALCOHOL(ETHYL),SERUM: 213 mg/dL — ABNORMAL HIGH (ref <10)
Ethyl Alcohol: 213 mg/dL — ABNORMAL HIGH (ref <10)

## 2021-04-24 LAB — LIPASE
Lipase: 308 U/L (ref 73–393)
Lipase: 308 U/L (ref 73–393)

## 2021-04-24 LAB — COMPREHENSIVE METABOLIC PANEL
ALT: 51 U/L (ref 12–78)
AST: 35 U/L (ref 15–37)
Albumin/Globulin Ratio: 0.9 — ABNORMAL LOW (ref 1.1–2.2)
Albumin: 3.1 g/dL — ABNORMAL LOW (ref 3.5–5.0)
Alkaline Phosphatase: 162 U/L — ABNORMAL HIGH (ref 45–117)
Anion Gap: 13 mmol/L (ref 5–15)
BUN: 7 mg/dL (ref 6–20)
Bun/Cre Ratio: 5 — ABNORMAL LOW (ref 12–20)
CO2: 30 mmol/L (ref 21–32)
Calcium: 8.3 mg/dL — ABNORMAL LOW (ref 8.5–10.1)
Chloride: 84 mmol/L — ABNORMAL LOW (ref 97–108)
Creatinine: 1.3 mg/dL (ref 0.70–1.30)
ESTIMATED GLOMERULAR FILTRATION RATE: >60 mL/min/{1.73_m2} (ref >60)
Globulin: 3.5 g/dL (ref 2.0–4.0)
Glucose: 491 mg/dL — ABNORMAL HIGH (ref 65–100)
Potassium: 2.4 mmol/L — CL (ref 3.5–5.1)
Sodium: 127 mmol/L — ABNORMAL LOW (ref 136–145)
Total Bilirubin: 0.3 mg/dL (ref 0.2–1.0)
Total Protein: 6.6 g/dL (ref 6.4–8.2)

## 2021-04-24 LAB — CBC WITH AUTO DIFFERENTIAL
Basophils %: 0 % (ref 0–1)
Basophils Absolute: 0 10*3/uL (ref 0.0–0.1)
Eosinophils %: 0 % (ref 0–7)
Eosinophils Absolute: 0 10*3/uL (ref 0.0–0.4)
Granulocyte Absolute Count: 0 10*3/uL (ref 0.00–0.04)
Hematocrit: 34.6 % — ABNORMAL LOW (ref 36.6–50.3)
Hemoglobin: 11.8 g/dL — ABNORMAL LOW (ref 12.1–17.0)
Immature Granulocytes: 1 % — ABNORMAL HIGH (ref 0.0–0.5)
Lymphocytes %: 39 % (ref 12–49)
Lymphocytes Absolute: 2.3 10*3/uL (ref 0.8–3.5)
MCH: 27.7 PG (ref 26.0–34.0)
MCHC: 34.1 g/dL (ref 30.0–36.5)
MCV: 81.2 FL (ref 80.0–99.0)
MPV: 9.3 FL (ref 8.9–12.9)
Monocytes %: 12 % (ref 5–13)
Monocytes Absolute: 0.7 10*3/uL (ref 0.0–1.0)
Neutrophils %: 48 % (ref 32–75)
Neutrophils Absolute: 2.7 10*3/uL (ref 1.8–8.0)
Platelets: 303 10*3/uL (ref 150–400)
RBC: 4.26 M/uL (ref 4.10–5.70)
RDW: 14 % (ref 11.5–14.5)
WBC: 5.7 10*3/uL (ref 4.1–11.1)

## 2021-04-24 LAB — POCT GLUCOSE
POC Glucose: 374 mg/dL — ABNORMAL HIGH (ref 65–100)
POC Glucose: 302 mg/dL — ABNORMAL HIGH (ref 65–100)
POC Glucose: 210 mg/dL — ABNORMAL HIGH (ref 65–100)

## 2021-04-24 MED ORDER — AMLODIPINE 5 MG TAB
5 mg | Freq: Every day | ORAL | Status: AC
Start: 2021-04-24 — End: 2021-04-27
  Administered 2021-04-25 – 2021-04-27 (×3): via ORAL

## 2021-04-24 MED ORDER — FOLIC ACID 1 MG TAB
1 mg | Freq: Every day | ORAL | Status: DC
Start: 2021-04-24 — End: 2021-04-27
  Administered 2021-04-24 – 2021-04-27 (×4): via ORAL

## 2021-04-24 MED ORDER — ENOXAPARIN 40 MG/0.4 ML SUB-Q SYRINGE
400.4 mg/0.4 mL | Freq: Every day | SUBCUTANEOUS | Status: DC
Start: 2021-04-24 — End: 2021-04-27
  Administered 2021-04-24 – 2021-04-27 (×4): via SUBCUTANEOUS

## 2021-04-24 MED ORDER — POTASSIUM CHLORIDE SR 10 MEQ TAB
10 mEq | ORAL | Status: AC
Start: 2021-04-24 — End: 2021-04-24
  Administered 2021-04-24: 11:00:00 via ORAL

## 2021-04-24 MED ORDER — ONDANSETRON (PF) 4 MG/2 ML INJECTION
4 mg/2 mL | INTRAMUSCULAR | Status: AC
Start: 2021-04-24 — End: 2021-04-24
  Administered 2021-04-24: 10:00:00 via INTRAVENOUS

## 2021-04-24 MED ORDER — INSULIN LISPRO 100 UNIT/ML INJECTION
100 unit/mL | Freq: Four times a day (QID) | SUBCUTANEOUS | Status: AC
Start: 2021-04-24 — End: 2021-04-27
  Administered 2021-04-24 – 2021-04-26 (×4): via SUBCUTANEOUS

## 2021-04-24 MED ORDER — SODIUM CHLORIDE 0.9% BOLUS IV
0.9 % | Freq: Once | INTRAVENOUS | Status: AC
Start: 2021-04-24 — End: 2021-04-24
  Administered 2021-04-24: 10:00:00 via INTRAVENOUS

## 2021-04-24 MED ORDER — SODIUM CHLORIDE 0.9 % IJ SYRG
INTRAMUSCULAR | Status: AC | PRN
Start: 2021-04-24 — End: 2021-04-27

## 2021-04-24 MED ORDER — CHLORDIAZEPOXIDE 5 MG CAP
5 mg | Freq: Three times a day (TID) | ORAL | Status: AC
Start: 2021-04-24 — End: 2021-04-27
  Administered 2021-04-24 – 2021-04-27 (×10): via ORAL

## 2021-04-24 MED ORDER — SUCRALFATE 1 GRAM TAB
1 gram | Freq: Four times a day (QID) | ORAL | Status: AC
Start: 2021-04-24 — End: 2021-04-27
  Administered 2021-04-24 – 2021-04-27 (×13): via ORAL

## 2021-04-24 MED ORDER — POTASSIUM CHLORIDE 10 MEQ/100 ML IV PIGGY BACK
10 mEq/0 mL | Freq: Once | INTRAVENOUS | Status: AC
Start: 2021-04-24 — End: 2021-04-24
  Administered 2021-04-24: 11:00:00 via INTRAVENOUS

## 2021-04-24 MED ORDER — LORAZEPAM 2 MG/ML IJ SOLN
2 mg/mL | INTRAMUSCULAR | Status: DC | PRN
Start: 2021-04-24 — End: 2021-04-27
  Administered 2021-04-24 – 2021-04-27 (×7): via INTRAVENOUS

## 2021-04-24 MED ORDER — ASPIRIN 81 MG TAB, DELAYED RELEASE
81 mg | Freq: Every day | ORAL | Status: AC
Start: 2021-04-24 — End: 2021-04-27
  Administered 2021-04-24 – 2021-04-27 (×4): via ORAL

## 2021-04-24 MED ORDER — SODIUM CHLORIDE 0.9 % IV
Freq: Once | INTRAVENOUS | Status: AC
Start: 2021-04-24 — End: 2021-04-24
  Administered 2021-04-24: 11:00:00 via INTRAVENOUS

## 2021-04-24 MED ORDER — ACETAMINOPHEN 650 MG RECTAL SUPPOSITORY
650 mg | Freq: Four times a day (QID) | RECTAL | Status: AC | PRN
Start: 2021-04-24 — End: 2021-04-27

## 2021-04-24 MED ORDER — PANTOPRAZOLE 20 MG TAB, DELAYED RELEASE
20 mg | Freq: Every day | ORAL | Status: AC
Start: 2021-04-24 — End: 2021-04-27
  Administered 2021-04-25 – 2021-04-27 (×3): via ORAL

## 2021-04-24 MED ORDER — ONDANSETRON 4 MG TAB, RAPID DISSOLVE
4 mg | Freq: Three times a day (TID) | ORAL | Status: AC | PRN
Start: 2021-04-24 — End: 2021-04-27

## 2021-04-24 MED ORDER — GLUCOSE 4 GRAM CHEWABLE TAB
4 gram | ORAL | Status: AC | PRN
Start: 2021-04-24 — End: 2021-04-27

## 2021-04-24 MED ORDER — ONDANSETRON (PF) 4 MG/2 ML INJECTION
4 mg/2 mL | INTRAMUSCULAR | Status: DC | PRN
Start: 2021-04-24 — End: 2021-04-25
  Administered 2021-04-24 – 2021-04-25 (×3): via INTRAVENOUS

## 2021-04-24 MED ORDER — HYDROMORPHONE 1 MG/ML INJECTION SOLUTION
1 mg/mL | INTRAMUSCULAR | Status: AC | PRN
Start: 2021-04-24 — End: 2021-04-26
  Administered 2021-04-24 – 2021-04-26 (×9): via INTRAVENOUS

## 2021-04-24 MED ORDER — GLUCAGON 1 MG INJECTION
1 mg | INTRAMUSCULAR | Status: AC | PRN
Start: 2021-04-24 — End: 2021-04-27

## 2021-04-24 MED ORDER — INSULIN GLARGINE 100 UNIT/ML INJECTION
100 unit/mL | Freq: Every evening | SUBCUTANEOUS | Status: AC
Start: 2021-04-24 — End: 2021-04-27
  Administered 2021-04-25 – 2021-04-27 (×3): via SUBCUTANEOUS

## 2021-04-24 MED ORDER — ACETAMINOPHEN 325 MG TABLET
325 mg | Freq: Four times a day (QID) | ORAL | Status: AC | PRN
Start: 2021-04-24 — End: 2021-04-27
  Administered 2021-04-27: 18:00:00 via ORAL

## 2021-04-24 MED ORDER — SODIUM CHLORIDE 0.9 % IV
INTRAVENOUS | Status: DC
Start: 2021-04-24 — End: 2021-04-25
  Administered 2021-04-24 – 2021-04-25 (×2): via INTRAVENOUS

## 2021-04-24 MED ORDER — PREGABALIN 50 MG CAP
50 mg | Freq: Two times a day (BID) | ORAL | Status: AC
Start: 2021-04-24 — End: 2021-04-27
  Administered 2021-04-25 – 2021-04-27 (×6): via ORAL

## 2021-04-24 MED ORDER — ONDANSETRON (PF) 4 MG/2 ML INJECTION
4 mg/2 mL | INTRAMUSCULAR | Status: AC
Start: 2021-04-24 — End: 2021-04-24
  Administered 2021-04-24: 12:00:00 via INTRAVENOUS

## 2021-04-24 MED ORDER — DEXTROSE 10% IN WATER (D10W) IV
10 % | INTRAVENOUS | Status: AC | PRN
Start: 2021-04-24 — End: 2021-04-27

## 2021-04-24 MED ORDER — FENOFIBRATE 160 MG TAB
160 mg | Freq: Every day | ORAL | Status: AC
Start: 2021-04-24 — End: 2021-04-27
  Administered 2021-04-24 – 2021-04-27 (×4): via ORAL

## 2021-04-24 MED ORDER — METOPROLOL TARTRATE 25 MG TAB
25 mg | Freq: Two times a day (BID) | ORAL | Status: AC
Start: 2021-04-24 — End: 2021-04-27
  Administered 2021-04-24 – 2021-04-27 (×7): via ORAL

## 2021-04-24 MED ORDER — MAGNESIUM SULFATE 2 GRAM/50 ML IVPB
2 gram/50 mL (4 %) | Freq: Once | INTRAVENOUS | Status: AC
Start: 2021-04-24 — End: 2021-04-24
  Administered 2021-04-24: 12:00:00 via INTRAVENOUS

## 2021-04-24 MED ORDER — ONDANSETRON (PF) 4 MG/2 ML INJECTION
4 mg/2 mL | Freq: Four times a day (QID) | INTRAMUSCULAR | Status: AC | PRN
Start: 2021-04-24 — End: 2021-04-27
  Administered 2021-04-26 – 2021-04-27 (×2): via INTRAVENOUS

## 2021-04-24 MED ORDER — ESCITALOPRAM 10 MG TAB
10 mg | Freq: Every day | ORAL | Status: AC
Start: 2021-04-24 — End: 2021-04-27
  Administered 2021-04-24 – 2021-04-27 (×4): via ORAL

## 2021-04-24 MED ORDER — POLYETHYLENE GLYCOL 3350 17 GRAM (100 %) ORAL POWDER PACKET
17 gram | Freq: Every day | ORAL | Status: AC | PRN
Start: 2021-04-24 — End: 2021-04-27

## 2021-04-24 MED ORDER — SODIUM CHLORIDE 0.9 % IJ SYRG
Freq: Three times a day (TID) | INTRAMUSCULAR | Status: AC
Start: 2021-04-24 — End: 2021-04-27
  Administered 2021-04-24 – 2021-04-27 (×11): via INTRAVENOUS

## 2021-04-24 MED ORDER — INSULIN LISPRO 100 UNIT/ML INJECTION
100 unit/mL | Freq: Three times a day (TID) | SUBCUTANEOUS | Status: AC
Start: 2021-04-24 — End: 2021-04-27
  Administered 2021-04-24 (×2): via SUBCUTANEOUS

## 2021-04-24 MED FILL — SUCRALFATE 1 GRAM TAB: 1 gram | ORAL | Qty: 1

## 2021-04-24 MED FILL — POTASSIUM CHLORIDE SR 10 MEQ TAB: 10 mEq | ORAL | Qty: 4

## 2021-04-24 MED FILL — MAGNESIUM SULFATE 2 GRAM/50 ML IVPB: 2 gram/50 mL (4 %) | INTRAVENOUS | Qty: 50

## 2021-04-24 MED FILL — ENOXAPARIN 40 MG/0.4 ML SUB-Q SYRINGE: 40 mg/0.4 mL | SUBCUTANEOUS | Qty: 0.4

## 2021-04-24 MED FILL — SODIUM CHLORIDE 0.9 % IJ SYRG: INTRAMUSCULAR | Qty: 40

## 2021-04-24 MED FILL — DEXTROSE 10% IN WATER (D10W) IV: 10 % | INTRAVENOUS | Qty: 250

## 2021-04-24 MED FILL — HUMALOG U-100 INSULIN 100 UNIT/ML SUBCUTANEOUS SOLUTION: 100 unit/mL | SUBCUTANEOUS | Qty: 8

## 2021-04-24 MED FILL — ONDANSETRON (PF) 4 MG/2 ML INJECTION: 4 mg/2 mL | INTRAMUSCULAR | Qty: 2

## 2021-04-24 MED FILL — HYDROMORPHONE (PF) 1 MG/ML IJ SOLN: 1 mg/mL | INTRAMUSCULAR | Qty: 1

## 2021-04-24 MED FILL — METOPROLOL TARTRATE 25 MG TAB: 25 mg | ORAL | Qty: 1

## 2021-04-24 MED FILL — SODIUM CHLORIDE 0.9 % IV: INTRAVENOUS | Qty: 1000

## 2021-04-24 MED FILL — CHLORDIAZEPOXIDE 5 MG CAP: 5 mg | ORAL | Qty: 2

## 2021-04-24 MED FILL — HUMALOG U-100 INSULIN 100 UNIT/ML SUBCUTANEOUS SOLUTION: 100 unit/mL | SUBCUTANEOUS | Qty: 4

## 2021-04-24 MED FILL — POTASSIUM CHLORIDE 10 MEQ/100 ML IV PIGGY BACK: 10 mEq/0 mL | INTRAVENOUS | Qty: 100

## 2021-04-24 MED FILL — LORAZEPAM 2 MG/ML IJ SOLN: 2 mg/mL | INTRAMUSCULAR | Qty: 1

## 2021-04-24 MED FILL — FENOFIBRATE 160 MG TAB: 160 mg | ORAL | Qty: 1

## 2021-04-24 MED FILL — FOLIC ACID 1 MG TAB: 1 mg | ORAL | Qty: 1

## 2021-04-24 MED FILL — ASPIRIN 81 MG TAB, DELAYED RELEASE: 81 mg | ORAL | Qty: 1

## 2021-04-24 MED FILL — ESCITALOPRAM 10 MG TAB: 10 mg | ORAL | Qty: 2

## 2021-04-24 NOTE — Progress Notes (Signed)
 Reason for Admission:  : Dehydration, Hypokalemia, Nausea and vomiting, ETOH abuse                     RUR Score:  17%                PCP: First and Last name:   Dr. Devorah      Name of Practice:    Are you a current patient: Yes/No: yes   Approximate date of last visit: it has been awhile   Can you participate in a virtual visit if needed:     Do you (patient/family) have any concerns for transition/discharge?   Not at this time                Plan for utilizing home health:   not at this time    Current Advanced Directive/Advance Care Plan:  Full Code      Healthcare Decision Maker:   Click here to complete HealthCare Decision Makers including selection of the Healthcare Decision Maker Relationship (ie Primary)            Primary Decision Maker: Shed, Nixon - Son - [phone]    Transition of Care Plan:        Met with patient at bedside. Lives at address on file with friend. No DME reported. No issues with transport. Self care with ADLs/IADLs. No concerns for discharge and picks medications up from doctors office per friend.    Discharge plan is home self care.

## 2021-04-24 NOTE — Progress Notes (Signed)
Problem: Falls - Risk of  Goal: *Absence of Falls  Description: Document Antonio Melendez Fall Risk and appropriate interventions in the flowsheet.  Outcome: Progressing Towards Goal  Note: Fall Risk Interventions:                                Problem: Patient Education: Go to Patient Education Activity  Goal: Patient/Family Education  Outcome: Progressing Towards Goal     Problem: Nausea/Vomiting (Adult)  Goal: *Absence of nausea/vomiting  Outcome: Progressing Towards Goal  Goal: *Palliation of nausea/vomiting (Palliative Care)  Outcome: Progressing Towards Goal

## 2021-04-24 NOTE — ED Notes (Signed)
Pt report given to Adrian Prows, RN

## 2021-04-24 NOTE — Progress Notes (Signed)
 Notified Dr Conrad of patient's arrival to [facility] from the freestanding ER.

## 2021-04-24 NOTE — ED Provider Notes (Signed)
EMERGENCY DEPARTMENT HISTORY AND PHYSICAL EXAM      Date: 04/24/2021  Patient Name: Antonio BARKAN Sr.    History of Presenting Illness     Chief complaint: Nausea vomiting  History Provided By:     HPI: Willette Pa., is a very pleasant 61 y.o. male presenting to the ED with a chief complaint of nausea and vomiting.  Patient states he has been consuming exclusively alcohol for the last 2 weeks.  He states he has been consuming approximately 8 bottles of homemade wine a day.  Has not eaten anything for the last week.  Also not taking his insulin for at least the last week.  He has not been able to keep anything down.  Persistent nausea and vomiting.  No bloody nor bilious emesis.  No fevers chills or rigors.  No abdominal pain.  No alleviating or aggravating factors.    The patient denies any other symptoms at this time.    PCP: Other, Phys, MD    No current facility-administered medications on file prior to encounter.     Current Outpatient Medications on File Prior to Encounter   Medication Sig Dispense Refill    aspirin delayed-release 81 mg tablet  (Patient not taking: Reported on 04/24/2021)      busPIRone (BUSPAR) 5 mg tablet  (Patient not taking: Reported on 04/24/2021)      dicyclomine (BENTYL) 20 mg tablet  (Patient not taking: Reported on 04/24/2021)      ergocalciferol (ERGOCALCIFEROL) 1,250 mcg (50,000 unit) capsule Take 50,000 Units by mouth. (Patient not taking: Reported on 04/24/2021)      escitalopram oxalate (LEXAPRO) 20 mg tablet Take 20 mg by mouth daily. (Patient not taking: Reported on 04/24/2021)      fenofibrate nanocrystallized (TRICOR) 145 mg tablet as directed. (Patient not taking: Reported on 4/78/2956)      folic acid (FOLVITE) 1 mg tablet 1 tablet (Patient not taking: Reported on 04/24/2021)      lidocaine (LIDODERM) 5 % APPLY 1 PATCH TO THE MOST PAINFUL AREA ON THE SKIN DAILY FOR 30 DAYS FOR UP TO 12 HOURS PER 24 HRS (Patient not taking: Reported on 04/24/2021)      lisinopriL (PRINIVIL,  ZESTRIL) 40 mg tablet Take 40 mg by mouth. (Patient not taking: Reported on 04/24/2021)      ondansetron (ZOFRAN ODT) 4 mg disintegrating tablet Take 4 mg by mouth every eight (8) hours as needed. (Patient not taking: Reported on 04/24/2021)      potassium chloride SR (KLOR-CON 10) 10 mEq tablet  (Patient not taking: Reported on 04/11/863)      salicylic acid 2 % clsr Take 400 mg by mouth daily. (Patient not taking: Reported on 04/24/2021)      Insulin Syringe-Needle U-100 0.5 mL 31 gauge x 5/16" syrg USE 1 WITH INSULIN 4 TIMES DAILY AS NEEDED (Patient not taking: Reported on 04/24/2021)      traMADoL (ULTRAM) 50 mg tablet Take 50 mg by mouth every six (6) hours as needed. (Patient not taking: Reported on 04/24/2021)      traZODone (DESYREL) 50 mg tablet Take 50 mg by mouth. (Patient not taking: Reported on 04/24/2021)      [DISCONTINUED] metoprolol succinate 25 mg CSpX 1 capsule      pregabalin (LYRICA) 50 mg capsule Take 50 mg by mouth two (2) times a day. (Patient not taking: Reported on 04/24/2021)      atorvastatin (LIPITOR) 80 mg tablet Take 80 mg by mouth  daily. (Patient not taking: Reported on 04/24/2021)      metoprolol succinate (TOPROL-XL) 100 mg tablet Take 100 mg by mouth daily. (Patient not taking: Reported on 04/24/2021)      naproxen (NAPROSYN) 500 mg tablet Take 1 Tablet by mouth every twelve (12) hours as needed for Pain. (Patient not taking: Reported on 04/24/2021) 20 Tablet 0    amLODIPine (NORVASC) 10 mg tablet Take 1 Tablet by mouth daily. (Patient not taking: Reported on 04/24/2021) 30 Tablet 3    metFORMIN ER (GLUCOPHAGE XR) 500 mg tablet Take 500 mg by mouth daily. (Patient not taking: Reported on 04/24/2021)      chlordiazePOXIDE (LIBRIUM) 10 mg capsule Take 1 Capsule by mouth three (3) times daily. Max Daily Amount: 30 mg. (Patient not taking: Reported on 04/24/2021) 21 Capsule 1    sucralfate (CARAFATE) 1 gram tablet Take 1 Tablet by mouth Before breakfast, lunch, dinner and at bedtime. (Patient not  taking: Reported on 04/24/2021) 21 Tablet 1    SITagliptin phosphate (JANUVIA) 50 mg tablet Take 50 mg by mouth daily. (Patient not taking: Reported on 04/24/2021)      insulin regular (NOVOLIN R, HUMULIN R) 100 unit/mL injection by SubCUTAneous route. On a scale insulin (Patient not taking: Reported on 04/24/2021)      omeprazole (PRILOSEC) 40 mg capsule Take 40 mg by mouth daily. (Patient not taking: Reported on 04/24/2021)      DULoxetine (CYMBALTA) 60 mg capsule Take  by mouth. (Patient not taking: Reported on 04/24/2021)      insulin nph-regular human rec (NovoLIN 70-30 FlexPen U-100) 100 unit/mL (70-30) inpn Inject 48 units qAM, 28 units qhs (Patient not taking: Reported on 04/24/2021) 2 Each 0       Past History     Past Medical History:  Past Medical History:   Diagnosis Date    Depression     Diabetes ([place])     High cholesterol     Hypertension        Past Surgical History:  Past Surgical History:   Procedure Laterality Date    HX CHOLECYSTECTOMY         Family History:  Family History   Problem Relation Age of Onset    Hypertension Mother        Social History:  Social History     Tobacco Use    Smoking status: Never    Smokeless tobacco: Never   Vaping Use    Vaping Use: Never used   Substance Use Topics    Alcohol use: Yes     Alcohol/week: 74.0 standard drinks     Types: 74 Cans of beer per week     Comment: 8 pints daily    Drug use: Not Currently       Allergies:  No Known Allergies      Review of Systems     Review of Systems   Constitutional:  Positive for fatigue. Negative for activity change, appetite change, chills and fever.        Anorexia   HENT:  Negative for congestion and sore throat.    Eyes:  Negative for photophobia and visual disturbance.   Respiratory:  Negative for cough, shortness of breath and wheezing.    Cardiovascular:  Negative for chest pain, palpitations and leg swelling.   Gastrointestinal:  Positive for nausea and vomiting. Negative for abdominal pain and diarrhea.   Endocrine:  Negative for cold intolerance and heat intolerance.   Musculoskeletal:  Negative for  gait problem and joint swelling.   Skin:  Negative for color change and rash.   Neurological:  Negative for dizziness and headaches.     Physical Exam     Physical Exam  Constitutional:       Appearance: Normal appearance. He is ill-appearing.   HENT:      Head: Normocephalic and atraumatic.      Right Ear: External ear normal.      Left Ear: External ear normal.      Mouth/Throat:      Mouth: Mucous membranes are dry.      Pharynx: Oropharynx is clear.   Eyes:      Extraocular Movements: Extraocular movements intact.      Conjunctiva/sclera: Conjunctivae normal.   Cardiovascular:      Rate and Rhythm: Regular rhythm. Tachycardia present.      Pulses: Normal pulses.      Heart sounds: Normal heart sounds.   Pulmonary:      Effort: Pulmonary effort is normal. No respiratory distress.      Breath sounds: Normal breath sounds. No wheezing, rhonchi or rales.   Abdominal:      General: There is no distension.      Palpations: Abdomen is soft.      Tenderness: There is no guarding or rebound.      Comments: Abdomen soft nontender with deep palpation in all quadrants   Musculoskeletal:         General: No deformity.      Cervical back: Normal range of motion and neck supple.      Right lower leg: No edema.      Left lower leg: No edema.   Skin:     Capillary Refill: Capillary refill takes 2 to 3 seconds.      Findings: No erythema or rash.   Neurological:      General: No focal deficit present.      Mental Status: He is alert and oriented to person, place, and time.      Gait: Gait normal.   Psychiatric:         Mood and Affect: Mood normal.         Behavior: Behavior normal.       Lab and Diagnostic Study Results     Labs -     Recent Results (from the past 12 hour(s))   CBC WITH AUTOMATED DIFF    Collection Time: 04/24/21  4:45 AM   Result Value Ref Range    WBC 5.7 4.1 - 11.1 K/uL    RBC 4.26 4.10 - 5.70 M/uL    HGB 11.8 (L) 12.1 - 17.0  g/dL    HCT 34.6 (L) 36.6 - 50.3 %    MCV 81.2 80.0 - 99.0 FL    MCH 27.7 26.0 - 34.0 PG    MCHC 34.1 30.0 - 36.5 g/dL    RDW 14.0 11.5 - 14.5 %    PLATELET 303 150 - 400 K/uL    MPV 9.3 8.9 - 12.9 FL    NEUTROPHILS 48 32 - 75 %    LYMPHOCYTES 39 12 - 49 %    MONOCYTES 12 5 - 13 %    EOSINOPHILS 0 0 - 7 %    BASOPHILS 0 0 - 1 %    IMMATURE GRANULOCYTES 1 (H) 0.0 - 0.5 %    ABS. NEUTROPHILS 2.7 1.8 - 8.0 K/UL    ABS. LYMPHOCYTES 2.3 0.8 - 3.5 K/UL  ABS. MONOCYTES 0.7 0.0 - 1.0 K/UL    ABS. EOSINOPHILS 0.0 0.0 - 0.4 K/UL    ABS. BASOPHILS 0.0 0.0 - 0.1 K/UL    ABS. IMM. GRANS. 0.0 0.00 - 0.04 K/UL    DF AUTOMATED     METABOLIC PANEL, COMPREHENSIVE    Collection Time: 04/24/21  4:45 AM   Result Value Ref Range    Sodium 127 (L) 136 - 145 mmol/L    Potassium 2.4 (LL) 3.5 - 5.1 mmol/L    Chloride 84 (L) 97 - 108 mmol/L    CO2 30 21 - 32 mmol/L    Anion gap 13 5 - 15 mmol/L    Glucose 491 (H) 65 - 100 mg/dL    BUN 7 6 - 20 mg/dL    Creatinine 1.30 0.70 - 1.30 mg/dL    BUN/Creatinine ratio 5 (L) 12 - 20      eGFR >60 >60 ml/min/1.57m    Calcium 8.3 (L) 8.5 - 10.1 mg/dL    Bilirubin, total 0.3 0.2 - 1.0 mg/dL    AST (SGOT) 35 15 - 37 U/L    ALT (SGPT) 51 12 - 78 U/L    Alk. phosphatase 162 (H) 45 - 117 U/L    Protein, total 6.6 6.4 - 8.2 g/dL    Albumin 3.1 (L) 3.5 - 5.0 g/dL    Globulin 3.5 2.0 - 4.0 g/dL    A-G Ratio 0.9 (L) 1.1 - 2.2     LIPASE    Collection Time: 04/24/21  4:45 AM   Result Value Ref Range    Lipase 308 73 - 393 U/L   ETHYL ALCOHOL    Collection Time: 04/24/21  4:45 AM   Result Value Ref Range    ALCOHOL(ETHYL),SERUM 213 (H) <10 mg/dL   MAGNESIUM    Collection Time: 04/24/21  4:45 AM   Result Value Ref Range    Magnesium 1.4 (L) 1.6 - 2.4 mg/dL       Radiologic Studies -   '@lastxrresult' @  CT Results  (Last 48 hours)      None          CXR Results  (Last 48 hours)      None              Medical Decision Making   - I am the first provider for this patient.  - I reviewed the vital signs, available  nursing notes, past medical history, past surgical history, family history and social history.  - Initial assessment performed. The patients presenting problems have been discussed, and they are in agreement with the care plan formulated and outlined with them.  I have encouraged them to ask questions as they arise throughout their visit.    Vital Signs-Reviewed the patient's vital signs.  Patient Vitals for the past 12 hrs:   Temp Pulse Resp BP SpO2   04/24/21 0544 -- (!) 111 18 -- 100 %   04/24/21 0456 97.9 ??F (36.6 ??C) (!) 111 18 (!) 141/94 97 %       CONSULTS: (Who and What was discussed)  None      Chronic Conditions: Reviewed above     Social Determinants affecting Dx or Tx: None     Records Reviewed (source and summary of external notes): Nursing notes           ED Course/ Provider Notes (Medical Decision Making):     Patient presented to the emergency department with the aforementioned chief complaint.  On examination the patient is ill-appearing.  Vitals were reviewed per above.  Patient is dry appearing on exam.  Abdomen is soft and nontender.  Denies abdominal pain, therefore no imaging at this time.  Serum glucose elevated without evidence of anion gap metabolic acidosis.  No fever nor leukocytosis to suggest overt infection.  Significant hypokalemia, 2.4, currently being replenished.  Given dehydration and electrolyte derangement, will admit patient.  Case discussed with hospitalist, Dr. Darcel Bayley who accepted admission        Procedures   Medical Decision Makingedical Decision Making  Performed by: Mirian Mo, DO  Procedures      CRITICAL CARE NOTE :    6:26 AM    IMPENDING DETERIORATION -Metabolic  ASSOCIATED RISK FACTORS - Metabolic changes and Dehydration  MANAGEMENT- Bedside Assessment and Transfer  INTERPRETATION -response to treatment  INTERVENTIONS - Metobolic interventions  CASE REVIEW - Hospitalist/Intensivist  TREATMENT RESPONSE -Improved  PERFORMED BY - Self    NOTES   :  I have spent 45  minutes of critical care time involved in lab review, consultations with specialist, family decision- making, bedside attention and documentation. This time excludes time spent in any separate billed procedures.  During this entire length of time I was immediately available to the patient .    Mirian Mo, DO        Disposition   Disposition:   Admission  Diagnosis     Clinical Impression:    1. Intractable nausea and vomiting    2. Severe dehydration    3. Hypokalemia    4. Hyperglycemia        Attestations:    Mirian Mo, DO    Please note that this dictation was completed with Dragon, the computer voice recognition software.  Quite often unanticipated grammatical, syntax, homophones, and other interpretive errors are inadvertently transcribed by the computer software.  Please disregard these errors.  Please excuse any errors that have escaped final proofreading.  Thank you.

## 2021-04-24 NOTE — Progress Notes (Deleted)
Two person admission skin assessment performed by [facility] Raven RN and Cherylann Parr RN. Pt's skin is clean, dry and intact. No abnormalities noted.     Nurse assisted pt with changing from regular clothes into a pt gown. Yellow socks on, yellow bracelet, fall sign, and bed alarm on.

## 2021-04-24 NOTE — ED Notes (Signed)
Pt states excessive ETOH approximately "8 bottles of boot leg wine in pint bottles",reports vomiting, pt also c/o hematuria

## 2021-04-24 NOTE — Progress Notes (Signed)
 Lantus  43 units HS + Humalog  8 units TID w meals was therapeutically interchanged for Novolin  70/30 48 units in AM and 28 units in PM per the P&T Committee approved Production assistant, radio.    TORIBIO Manus RANA MS, Pharmacist  04/24/2021 10:33 AM

## 2021-04-24 NOTE — H&P (Signed)
H&P by Terrace Arabia, NP at 04/24/21 [phone]                Author: Terrace Arabia, NP  Service: Nurse Practitioner  Author Type: Nurse Practitioner       Filed: 04/24/21 1147  Date of Service: 04/24/21 0947  Status: Signed           Editor: Terrace Arabia, NP (Nurse Practitioner)  Cosigner: Dayle Points, MD at 05/02/21 1712                                 History and Physical                  Patient: Antonio Bast Sr.  MRN: [id number]   SSN: [id number]          Date of Birth: December 16, 1960   Age: 60 y.o.   Sex: male           Subjective:         Antonio ACKERMAN Sr. is a 61 y.o. male who  has a past medical history significant for hypertension, type 2 diabetes, GERD,hyperlipidemia, and neuropathy. He was transferred from free standing ER. He reports he is going through a divorce and started drinking  alcohol about 2 weeks ago. He admits to drinking 8 bottles of wine daily. He does report abdominal pain with tenderness to light palpation. He states his last alcoholic drink was on Saturday. He has been visiting family in [place]. He does report  nausea and vomiting. He states his appetite has been poor. He states he has not had any of his medications in over two weeks. He is being admitted for alcohol withdrawal for further evaluation and management.      Labs on admission: WBC 5.7, HgB 11.8, Platelet 303, urinalysis unremarkable, Sodium 127, Potassium 2.4 given 10 meq of IV potassium followed by 40 meq of potassium oral, Started on 0.9% Normal Saline at 100 ml/hr.  Lipase 308. CT of abdomen pending        Past Medical History:        Diagnosis  Date         ?  Depression       ?  Diabetes ([place])       ?  ETOH abuse       ?  High cholesterol           ?  Hypertension            Past Surgical History:         Procedure  Laterality  Date          ?  HX CHOLECYSTECTOMY               Family History         Problem  Relation  Age of Onset          ?  Hypertension  Mother            Social  History          Tobacco Use         ?  Smoking status:  Never     ?  Smokeless tobacco:  Never       Substance Use Topics         ?  Alcohol use:  Yes  Alcohol/week:  74.0 standard drinks         Types:  74 Cans of beer per week             Comment: 8 pints daily           Prior to Admission medications             Medication  Sig  Start Date  End Date  Taking?  Authorizing Provider            lisinopriL (PRINIVIL, ZESTRIL) 40 mg tablet  Take 40 mg by mouth.  01/18/21    Yes  Other, Phys, MD     amLODIPine (NORVASC) 10 mg tablet  Take 1 Tablet by mouth daily.  08/26/20    Yes  Orson Eva, MD     metFORMIN ER (GLUCOPHAGE XR) 500 mg tablet  Take 500 mg by mouth daily.  07/14/20    Yes  Provider, Historical     sucralfate (CARAFATE) 1 gram tablet  Take 1 Tablet by mouth Before breakfast, lunch, dinner and at bedtime.  08/05/20    Yes  Reasoner, Gasper Sells, PA-C     insulin regular (NOVOLIN R, HUMULIN R) 100 unit/mL injection  by SubCUTAneous route. On a scale insulin      Yes  Provider, Historical     omeprazole (PRILOSEC) 40 mg capsule  Take 40 mg by mouth daily.      Yes  Provider, Historical     insulin nph-regular human rec (NovoLIN 70-30 FlexPen U-100) 100 unit/mL (70-30) inpn  Inject 48 units qAM, 28 units qhs  08/02/20    Yes  Staci Acosta D, DO     aspirin delayed-release 81 mg tablet          Other, Phys, MD     busPIRone (BUSPAR) 5 mg tablet    04/05/21      Other, Phys, MD     dicyclomine (BENTYL) 20 mg tablet    04/20/21      Other, Phys, MD     ergocalciferol (ERGOCALCIFEROL) 1,250 mcg (50,000 unit) capsule  Take 50,000 Units by mouth.   Patient not taking: Reported on 04/24/2021  10/17/20      Other, Phys, MD     escitalopram oxalate (LEXAPRO) 20 mg tablet  Take 20 mg by mouth daily.   Patient not taking: Reported on 04/24/2021  01/17/21      Other, Phys, MD            fenofibrate nanocrystallized (TRICOR) 145 mg tablet  as directed.   Patient not taking: Reported on 04/24/2021  07/02/20       Other, Phys, MD            folic acid (FOLVITE) 1 mg tablet  1 tablet   Patient not taking: Reported on 04/24/2021  10/14/20      Other, Phys, MD     lidocaine (LIDODERM) 5 %  APPLY 1 PATCH TO THE MOST PAINFUL AREA ON THE SKIN DAILY FOR 30 DAYS FOR UP TO 12 HOURS PER 24 HRS   Patient not taking: Reported on 04/24/2021  03/09/21      Other, Phys, MD     ondansetron (ZOFRAN ODT) 4 mg disintegrating tablet  Take 4 mg by mouth every eight (8) hours as needed.   Patient not taking: Reported on 04/24/2021  01/01/21      Other, Phys, MD     potassium chloride SR (KLOR-CON 10) 10 mEq tablet  04/20/21      Other, Phys, MD     salicylic acid 2 % clsr  Take 400 mg by mouth daily.   Patient not taking: Reported on 04/24/2021        Other, Phys, MD     Insulin Syringe-Needle U-100 0.5 mL 31 gauge x 5/16" syrg  USE 1 WITH INSULIN 4 TIMES DAILY AS NEEDED   Patient not taking: Reported on 04/24/2021  01/17/21      Other, Phys, MD     traMADoL (ULTRAM) 50 mg tablet  Take 50 mg by mouth every six (6) hours as needed.   Patient not taking: Reported on 04/24/2021  02/27/21      Other, Phys, MD     traZODone (DESYREL) 50 mg tablet  Take 50 mg by mouth.   Patient not taking: Reported on 04/24/2021  01/17/21      Other, Phys, MD     pregabalin (LYRICA) 50 mg capsule  Take 50 mg by mouth two (2) times a day.   Patient not taking: Reported on 04/24/2021  03/09/21      Provider, Historical            atorvastatin (LIPITOR) 80 mg tablet  Take 80 mg by mouth daily.   Patient not taking: No sig reported  01/17/21      Provider, Historical            metoprolol succinate (TOPROL-XL) 100 mg tablet  Take 100 mg by mouth daily.   Patient not taking: Reported on 04/24/2021  04/05/21      Provider, Historical     naproxen (NAPROSYN) 500 mg tablet  Take 1 Tablet by mouth every twelve (12) hours as needed for Pain.   Patient not taking: Reported on 04/24/2021  10/02/20      Elder Cyphers, MD     chlordiazePOXIDE (LIBRIUM) 10 mg capsule  Take 1 Capsule by mouth three  (3) times daily. Max Daily Amount: 30 mg.   Patient not taking: Reported on 04/24/2021  08/05/20      Reasoner, Gasper Sells, PA-C     SITagliptin phosphate (JANUVIA) 50 mg tablet  Take 50 mg by mouth daily.   Patient not taking: Reported on 04/24/2021        Provider, Historical            DULoxetine (CYMBALTA) 60 mg capsule  Take  by mouth.   Patient not taking: Reported on 04/24/2021  01/29/20      Other, Phys, MD            No Known Allergies      Review of Systems    Constitutional:  Positive for malaise/fatigue and weight loss . Negative for chills and fever.    HENT:  Negative for hearing loss.     Respiratory:  Negative for cough, shortness of breath and wheezing.     Cardiovascular:  Negative for chest pain and leg swelling.    Gastrointestinal:  Positive for abdominal pain, heartburn , nausea and vomiting. Negative  for constipation and diarrhea.    Genitourinary: Negative.     Musculoskeletal: Negative.     Skin:  Negative for itching and rash.    Neurological:  Positive for tingling. Negative for dizziness, tremors and headaches.    Psychiatric/Behavioral:  Positive for depression and  substance abuse.             Objective:          Vitals:  04/24/21 0456  04/24/21 0544  04/24/21 0850          BP:  (!) 141/94    (!) 140/78     Pulse:  (!) 111  (!) 111  (!) 116     Resp:  '18  18  18     ' Temp:  97.9 ??F (36.6 ??C)    97.8 ??F (36.6 ??C)     SpO2:  97%  100%  97%     Weight:  83.9 kg (185 lb)              Height:  '5\' 9"'  (1.753 m)                Physical Exam   Constitutional :        Appearance: He is ill-appearing.    HENT:       Head: Normocephalic and atraumatic.    Eyes:       General: No scleral icterus.      Pupils: Pupils are equal, round, and reactive to light.     Cardiovascular:       Rate and Rhythm: Regular rhythm. Tachycardia present.       Heart sounds: No murmur heard.     No gallop.    Pulmonary:       Effort: Pulmonary effort is normal.       Breath sounds: No wheezing, rhonchi or rales.      Abdominal:       General: Bowel sounds are normal.       Palpations: Abdomen is soft.       Tenderness: There is abdominal tenderness. There is guarding .     Musculoskeletal:       Right lower leg: No edema.       Left lower leg: No edema.    Skin:      General: Skin is warm and dry.       Coloration: Skin is pale.    Neurological:       Mental Status: He is alert and oriented to person, place, and time.    Psychiatric:       Comments: anxious               Recent Results (from the past 24 hour(s))     CBC WITH AUTOMATED DIFF          Collection Time: 04/24/21  4:45 AM         Result  Value  Ref Range            WBC  5.7  4.1 - 11.1 K/uL       RBC  4.26  4.10 - 5.70 M/uL       HGB  11.8 (L)  12.1 - 17.0 g/dL       HCT  34.6 (L)  36.6 - 50.3 %       MCV  81.2  80.0 - 99.0 FL       MCH  27.7  26.0 - 34.0 PG       MCHC  34.1  30.0 - 36.5 g/dL       RDW  14.0  11.5 - 14.5 %       PLATELET  303  150 - 400 K/uL       MPV  9.3  8.9 - 12.9 FL       NEUTROPHILS  48  32 - 75 %       LYMPHOCYTES  39  12 - 49 %       MONOCYTES  12  5 - 13 %       EOSINOPHILS  0  0 - 7 %       BASOPHILS  0  0 - 1 %       IMMATURE GRANULOCYTES  1 (H)  0.0 - 0.5 %       ABS. NEUTROPHILS  2.7  1.8 - 8.0 K/UL       ABS. LYMPHOCYTES  2.3  0.8 - 3.5 K/UL       ABS. MONOCYTES  0.7  0.0 - 1.0 K/UL       ABS. EOSINOPHILS  0.0  0.0 - 0.4 K/UL       ABS. BASOPHILS  0.0  0.0 - 0.1 K/UL       ABS. IMM. GRANS.  0.0  0.00 - 0.04 K/UL       DF  AUTOMATED          METABOLIC PANEL, COMPREHENSIVE          Collection Time: 04/24/21  4:45 AM         Result  Value  Ref Range            Sodium  127 (L)  136 - 145 mmol/L       Potassium  2.4 (LL)  3.5 - 5.1 mmol/L       Chloride  84 (L)  97 - 108 mmol/L       CO2  30  21 - 32 mmol/L       Anion gap  13  5 - 15 mmol/L       Glucose  491 (H)  65 - 100 mg/dL       BUN  7  6 - 20 mg/dL       Creatinine  1.30  0.70 - 1.30 mg/dL       BUN/Creatinine ratio  5 (L)  12 - 20         eGFR  >60  >60 ml/min/1.14m       Calcium   8.3 (L)  8.5 - 10.1 mg/dL       Bilirubin, total  0.3  0.2 - 1.0 mg/dL       AST (SGOT)  35  15 - 37 U/L       ALT (SGPT)  51  12 - 78 U/L       Alk. phosphatase  162 (H)  45 - 117 U/L       Protein, total  6.6  6.4 - 8.2 g/dL       Albumin  3.1 (L)  3.5 - 5.0 g/dL       Globulin  3.5  2.0 - 4.0 g/dL       A-G Ratio  0.9 (L)  1.1 - 2.2         LIPASE          Collection Time: 04/24/21  4:45 AM         Result  Value  Ref Range            Lipase  308  73 - 393 U/L       ETHYL ALCOHOL          Collection Time: 04/24/21  4:45 AM         Result  Value  Ref Range            ALCOHOL(ETHYL),SERUM  213 (  H)  <10 mg/dL       MAGNESIUM          Collection Time: 04/24/21  4:45 AM         Result  Value  Ref Range            Magnesium  1.4 (L)  1.6 - 2.4 mg/dL       GLUCOSE, POC          Collection Time: 04/24/21  6:49 AM         Result  Value  Ref Range            Glucose (POC)  374 (H)  65 - 100 mg/dL            Performed by  Doretha Sou             XR Results (most recent):   Results from Hospital Encounter encounter on 04/16/21      XR CHEST PORT      Narrative   PORTABLE CHEST RADIOGRAPH/S: 04/16/2021 8:46 PM      INDICATION: Chest pain.      COMPARISON: None.      TECHNIQUE: Portable frontal upright radiograph/s of the chest.      FINDINGS:   The lungs are clear. The central airways are patent. No pneumothorax or pleural   effusion. Sutural anchors in the right humeral head.      Impression   Clear lungs.          CT Results (most recent):   Results from Hospital Encounter encounter on 04/16/21      CTA CHEST W OR W WO CONT      Narrative   EXAM:  CTA CHEST W OR W WO CONT      INDICATION: Elevated d-dimer. Evaluate for pulmonary embolus.      COMPARISON: None.      TECHNIQUE: Helical thin section chest CT following uneventful intravenous   administration of nonionic contrast 100 mL of isovue 370 according to   departmental PE protocol. Coronal and sagittal reformats were performed. 3D post   processing was performed.  CT  dose reduction was achieved through the use of a   standardized protocol tailored for this examination and automatic exposure   control for dose modulation.      FINDINGS: This is a good quality study for the evaluation of pulmonary embolism   to the first subsegmental arterial level. There is no pulmonary embolism to this   level.         THYROID: No nodule.   MEDIASTINUM: No mass or lymphadenopathy.   HILA: No mass or lymphadenopathy.   THORACIC AORTA: Atherosclerotic calcification without aneurysm or dissection.   HEART: The heart is normal in size without pericardial effusion. Coronary artery   calcifications are noted.   ESOPHAGUS: No wall thickening or dilatation.   TRACHEA/BRONCHI: Patent.   PLEURA: No effusion or pneumothorax.   LUNGS: No nodule, mass, or airspace disease.   UPPER ABDOMEN: Partially imaged. No acute pathology.   BONES: No aggressive bone lesion or fracture. Mild degenerative spine change.      Impression   No pulmonary embolus or other acute cardiopulmonary process.          MRI Results (most recent):   No results found for this or any previous visit.                Active Problems:     Withdrawal symptoms, alcohol ([place]) (08/03/2020)  Alcohol withdrawal ([place]) (08/03/2020)        Alcohol abuse (10/27/2019)        Dehydration (04/24/2021)        Hypokalemia (04/24/2021)        Nausea and vomiting (04/24/2021)        ETOH abuse (04/24/2021)              Assessment/Plan:        Alcohol Withdrawal   - CIWA Protocol   -Ativan 2 mg Ever  4 hours as needed   -folic Acid 1 mg daily   -0.9% Normal Saline 100 ml/hr   -Librium 10 mg  oral TID      Hypertension   -Amlodipine 10 mg daily   -Metoprolol 25 mg BID      Type 2 Diabetes   -Home dose insulin   -sliding scale coverage      Nausea/Vomiting/Dehydration   -zofran as needed   -Continue IV hydration   -NPO except medications      Neuropathy   -Lyrica 50 mg twice daily      GERD   -Protonix 40 mg oral daily   -Carafate 1 gm tablet before meals       Hyponatremia   -0.9% Normal Saline   -CMP in the am      Hypokalemia   -s/p 50 meq potassium   -Repeat potassium pending      DVT Prophylaxis: lovenox   Code Status: Full code   POA/NOK:      Total Time spent in direct and indirect care including assessment review of labs and coordination of services and consultations: Greater than 75 minutes            Signed By:  Terrace Arabia, NP           April 24, 2021

## 2021-04-25 LAB — CBC WITH AUTOMATED DIFF
ABS. BASOPHILS: 0 10*3/uL (ref 0.0–0.1)
ABS. EOSINOPHILS: 0.1 10*3/uL (ref 0.0–0.4)
ABS. IMM. GRANS.: 0 10*3/uL (ref 0.00–0.04)
ABS. LYMPHOCYTES: 1.6 10*3/uL (ref 0.8–3.5)
ABS. MONOCYTES: 0.5 10*3/uL (ref 0.0–1.0)
ABS. NEUTROPHILS: 7.5 10*3/uL (ref 1.8–8.0)
ABSOLUTE NRBC: 0 10*3/uL (ref 0.00–0.01)
BASOPHILS: 0 % (ref 0–1)
EOSINOPHILS: 1 % (ref 0–7)
HCT: 32.1 % — ABNORMAL LOW (ref 36.6–50.3)
HGB: 10.8 g/dL — ABNORMAL LOW (ref 12.1–17.0)
IMMATURE GRANULOCYTES: 0 % (ref 0–0.5)
LYMPHOCYTES: 16 % (ref 12–49)
MCH: 28 PG (ref 26.0–34.0)
MCHC: 33.6 g/dL (ref 30.0–36.5)
MCV: 83.2 FL (ref 80.0–99.0)
MONOCYTES: 5 % (ref 5–13)
MPV: 10.3 FL (ref 8.9–12.9)
NEUTROPHILS: 78 % — ABNORMAL HIGH (ref 32–75)
NRBC: 0 PER 100 WBC
PLATELET: 281 10*3/uL (ref 150–400)
RBC: 3.86 M/uL — ABNORMAL LOW (ref 4.10–5.70)
RDW: 14.3 % (ref 11.5–14.5)
WBC: 9.7 10*3/uL (ref 4.1–11.1)

## 2021-04-25 LAB — METABOLIC PANEL, COMPREHENSIVE
A-G Ratio: 0.8 — ABNORMAL LOW (ref 1.1–2.2)
ALT (SGPT): 35 U/L (ref 12–78)
AST (SGOT): 30 U/L (ref 15–37)
Albumin: 2.5 g/dL — ABNORMAL LOW (ref 3.5–5.0)
Alk. phosphatase: 133 U/L — ABNORMAL HIGH (ref 45–117)
Anion gap: 6 mmol/L (ref 5–15)
BUN/Creatinine ratio: 7 — ABNORMAL LOW (ref 12–20)
BUN: 6 mg/dL (ref 6–20)
Bilirubin, total: 0.5 mg/dL (ref 0.2–1.0)
CO2: 33 mmol/L — ABNORMAL HIGH (ref 21–32)
Calcium: 7.8 mg/dL — ABNORMAL LOW (ref 8.5–10.1)
Chloride: 94 mmol/L — ABNORMAL LOW (ref 97–108)
Creatinine: 0.92 mg/dL (ref 0.70–1.30)
Globulin: 3.2 g/dL (ref 2.0–4.0)
Glucose: 86 mg/dL (ref 65–100)
Potassium: 2.5 mmol/L — CL (ref 3.5–5.1)
Protein, total: 5.7 g/dL — ABNORMAL LOW (ref 6.4–8.2)
Sodium: 133 mmol/L — ABNORMAL LOW (ref 136–145)
eGFR: >60 mL/min/{1.73_m2} (ref >60)

## 2021-04-25 LAB — GLUCOSE, POC
Glucose (POC): 160 mg/dL — ABNORMAL HIGH (ref 65–100)
Glucose (POC): 85 mg/dL (ref 65–100)
Glucose (POC): 88 mg/dL (ref 65–100)
Glucose (POC): 100 mg/dL (ref 65–100)

## 2021-04-25 LAB — LIPASE
Lipase: 689 U/L — ABNORMAL HIGH (ref 73–393)
Lipase: 689 U/L — ABNORMAL HIGH (ref 73–393)

## 2021-04-25 LAB — MAGNESIUM
Magnesium: 1.7 mg/dL (ref 1.6–2.4)
Magnesium: 1.7 mg/dL (ref 1.6–2.4)

## 2021-04-25 LAB — CBC WITH AUTO DIFFERENTIAL
Basophils %: 0 % (ref 0–1)
Basophils Absolute: 0 10*3/uL (ref 0.0–0.1)
Eosinophils %: 1 % (ref 0–7)
Eosinophils Absolute: 0.1 10*3/uL (ref 0.0–0.4)
Granulocyte Absolute Count: 0 10*3/uL (ref 0.00–0.04)
Hematocrit: 32.1 % — ABNORMAL LOW (ref 36.6–50.3)
Hemoglobin: 10.8 g/dL — ABNORMAL LOW (ref 12.1–17.0)
Immature Granulocytes: 0 % (ref 0–0.5)
Lymphocytes %: 16 % (ref 12–49)
Lymphocytes Absolute: 1.6 10*3/uL (ref 0.8–3.5)
MCH: 28 PG (ref 26.0–34.0)
MCHC: 33.6 g/dL (ref 30.0–36.5)
MCV: 83.2 FL (ref 80.0–99.0)
MPV: 10.3 FL (ref 8.9–12.9)
Monocytes %: 5 % (ref 5–13)
Monocytes Absolute: 0.5 10*3/uL (ref 0.0–1.0)
NRBC Absolute: 0 10*3/uL (ref 0.00–0.01)
Neutrophils %: 78 % — ABNORMAL HIGH (ref 32–75)
Neutrophils Absolute: 7.5 10*3/uL (ref 1.8–8.0)
Nucleated RBCs: 0 PER 100 WBC
Platelets: 281 10*3/uL (ref 150–400)
RBC: 3.86 M/uL — ABNORMAL LOW (ref 4.10–5.70)
RDW: 14.3 % (ref 11.5–14.5)
WBC: 9.7 10*3/uL (ref 4.1–11.1)

## 2021-04-25 LAB — COMPREHENSIVE METABOLIC PANEL
ALT: 35 U/L (ref 12–78)
AST: 30 U/L (ref 15–37)
Albumin/Globulin Ratio: 0.8 — ABNORMAL LOW (ref 1.1–2.2)
Albumin: 2.5 g/dL — ABNORMAL LOW (ref 3.5–5.0)
Alkaline Phosphatase: 133 U/L — ABNORMAL HIGH (ref 45–117)
Anion Gap: 6 mmol/L (ref 5–15)
BUN: 6 mg/dL (ref 6–20)
Bun/Cre Ratio: 7 — ABNORMAL LOW (ref 12–20)
CO2: 33 mmol/L — ABNORMAL HIGH (ref 21–32)
Calcium: 7.8 mg/dL — ABNORMAL LOW (ref 8.5–10.1)
Chloride: 94 mmol/L — ABNORMAL LOW (ref 97–108)
Creatinine: 0.92 mg/dL (ref 0.70–1.30)
ESTIMATED GLOMERULAR FILTRATION RATE: >60 mL/min/{1.73_m2} (ref >60)
Globulin: 3.2 g/dL (ref 2.0–4.0)
Glucose: 86 mg/dL (ref 65–100)
Potassium: 2.5 mmol/L — CL (ref 3.5–5.1)
Sodium: 133 mmol/L — ABNORMAL LOW (ref 136–145)
Total Bilirubin: 0.5 mg/dL (ref 0.2–1.0)
Total Protein: 5.7 g/dL — ABNORMAL LOW (ref 6.4–8.2)

## 2021-04-25 LAB — POCT GLUCOSE
POC Glucose: 160 mg/dL — ABNORMAL HIGH (ref 65–100)
POC Glucose: 85 mg/dL (ref 65–100)
POC Glucose: 88 mg/dL (ref 65–100)
POC Glucose: 100 mg/dL (ref 65–100)

## 2021-04-25 MED ORDER — POTASSIUM CHLORIDE 10 MEQ/100 ML IV PIGGY BACK
10 mEq/0 mL | INTRAVENOUS | Status: AC
Start: 2021-04-25 — End: 2021-04-25
  Administered 2021-04-25 (×3): via INTRAVENOUS

## 2021-04-25 MED ORDER — POTASSIUM CHLORIDE SR 20 MEQ TAB, PARTICLES/CRYSTALS
20 mEq | Freq: Two times a day (BID) | ORAL | Status: AC
Start: 2021-04-25 — End: 2021-04-27
  Administered 2021-04-25 – 2021-04-27 (×5): via ORAL

## 2021-04-25 MED ORDER — NS WITH POTASSIUM CHLORIDE 40 MEQ/L IV
40 mEq/L | INTRAVENOUS | Status: DC
Start: 2021-04-25 — End: 2021-04-27
  Administered 2021-04-25 – 2021-04-27 (×5): via INTRAVENOUS

## 2021-04-25 MED FILL — SUCRALFATE 1 GRAM TAB: 1 gram | ORAL | Qty: 1

## 2021-04-25 MED FILL — HYDROMORPHONE (PF) 1 MG/ML IJ SOLN: 1 mg/mL | INTRAMUSCULAR | Qty: 1

## 2021-04-25 MED FILL — LORAZEPAM 2 MG/ML IJ SOLN: 2 mg/mL | INTRAMUSCULAR | Qty: 1

## 2021-04-25 MED FILL — TRUEPLUS GLUCOSE 4 GRAM CHEWABLE TABLET: 4 gram | ORAL | Qty: 10

## 2021-04-25 MED FILL — LANTUS U-100 INSULIN 100 UNIT/ML SUBCUTANEOUS SOLUTION: 100 unit/mL | SUBCUTANEOUS | Qty: 43

## 2021-04-25 MED FILL — AMLODIPINE 5 MG TAB: 5 mg | ORAL | Qty: 2

## 2021-04-25 MED FILL — FOLIC ACID 1 MG TAB: 1 mg | ORAL | Qty: 1

## 2021-04-25 MED FILL — CHLORDIAZEPOXIDE 5 MG CAP: 5 mg | ORAL | Qty: 2

## 2021-04-25 MED FILL — PREGABALIN 50 MG CAP: 50 mg | ORAL | Qty: 1

## 2021-04-25 MED FILL — ASPIRIN 81 MG TAB, DELAYED RELEASE: 81 mg | ORAL | Qty: 1

## 2021-04-25 MED FILL — ESCITALOPRAM 10 MG TAB: 10 mg | ORAL | Qty: 2

## 2021-04-25 MED FILL — ONDANSETRON (PF) 4 MG/2 ML INJECTION: 4 mg/2 mL | INTRAMUSCULAR | Qty: 2

## 2021-04-25 MED FILL — NS WITH POTASSIUM CHLORIDE 40 MEQ/L IV: 40 mEq/L | INTRAVENOUS | Qty: 1000

## 2021-04-25 MED FILL — POTASSIUM CHLORIDE 10 MEQ/100 ML IV PIGGY BACK: 10 mEq/0 mL | INTRAVENOUS | Qty: 100

## 2021-04-25 MED FILL — HUMALOG U-100 INSULIN 100 UNIT/ML SUBCUTANEOUS SOLUTION: 100 unit/mL | SUBCUTANEOUS | Qty: 2

## 2021-04-25 MED FILL — PANTOPRAZOLE 20 MG TAB, DELAYED RELEASE: 20 mg | ORAL | Qty: 2

## 2021-04-25 MED FILL — METOPROLOL TARTRATE 25 MG TAB: 25 mg | ORAL | Qty: 1

## 2021-04-25 MED FILL — FENOFIBRATE 160 MG TAB: 160 mg | ORAL | Qty: 1

## 2021-04-25 MED FILL — POTASSIUM CHLORIDE SR 20 MEQ TAB, PARTICLES/CRYSTALS: 20 mEq | ORAL | Qty: 1

## 2021-04-25 MED FILL — ENOXAPARIN 40 MG/0.4 ML SUB-Q SYRINGE: 40 mg/0.4 mL | SUBCUTANEOUS | Qty: 0.4

## 2021-04-25 NOTE — Progress Notes (Signed)
 Problem: Falls - Risk of  Goal: *Absence of Falls  Description: Document Deloris Fall Risk and appropriate interventions in the flowsheet.  Outcome: Progressing Towards Goal  Note: Fall Risk Interventions:                                Problem: Nausea/Vomiting (Adult)  Goal: *Absence of nausea/vomiting  Outcome: Progressing Towards Goal     Problem: Pain  Goal: *Control of Pain  Outcome: Progressing Towards Goal     Problem: Alcohol Withdrawal  Goal: *STG: Participates in treatment plan  Outcome: Progressing Towards Goal

## 2021-04-25 NOTE — Progress Notes (Signed)
Progress  Notes by Vira Browns, NP at 04/25/21 1101                Author: Vira Browns, NP  Service: Internal Medicine  Author Type: Nurse Practitioner       Filed: 04/25/21 1111  Date of Service: 04/25/21 1101  Status: Signed          Editor: Vira Browns, NP (Nurse Practitioner)                        Hospitalist Progress Note          Vira Browns, APRN, FNP-C      Daily Progress Note: 04/25/2021         Subjective:    Subjective    Patient examined alert and oriented lying in bed. No acute distress noted on examination.      Review of Systems:    Review of Systems    Constitutional:  Negative for chills and fever.    Respiratory:  Negative for cough.     Cardiovascular:  Negative for chest pain.    Gastrointestinal:  Negative for abdominal pain, nausea and vomiting.    Genitourinary:  Negative for dysuria.          Objective:    Objective        Vitals:   Patient Vitals for the past 12 hrs:            Temp  Pulse  Resp  BP  SpO2            04/25/21 0800  98.2 ??F (36.8 ??C)  (!) 102  19  134/82  94 %            04/24/21 2324  98.6 ??F (37 ??C)  94  18  (!) 149/83  96 %            Physical Exam   Vitals and nursing note reviewed.    Constitutional:        Appearance: Normal appearance.     Eyes:       Extraocular Movements: Extraocular movements intact.     Cardiovascular:       Rate and Rhythm: Tachycardia present.    Pulmonary:       Breath sounds: Normal breath sounds.     Abdominal:       General: Bowel sounds are normal.    Skin:      General: Skin is warm and dry.    Neurological:       Mental Status: He is alert and oriented to person, place, and time.           Lab Results:     Recent Results (from the past 24 hour(s))     GLUCOSE, POC          Collection Time: 04/24/21  4:33 PM         Result  Value  Ref Range            Glucose (POC)  210 (H)  65 - 100 mg/dL       Performed by  Cyril Loosen         GLUCOSE, POC          Collection Time: 04/24/21  7:48 PM         Result  Value  Ref Range             Glucose (POC)  160 (H)  65 -  100 mg/dL       Performed by  [place], POC          Collection Time: 04/25/21  7:45 AM         Result  Value  Ref Range            Glucose (POC)  85  65 - 100 mg/dL       Performed by  COX ANGELA         CBC WITH AUTOMATED DIFF          Collection Time: 04/25/21  8:22 AM         Result  Value  Ref Range            WBC  9.7  4.1 - 11.1 K/uL       RBC  3.86 (L)  4.10 - 5.70 M/uL       HGB  10.8 (L)  12.1 - 17.0 g/dL       HCT  32.1 (L)  36.6 - 50.3 %       MCV  83.2  80.0 - 99.0 FL       MCH  28.0  26.0 - 34.0 PG       MCHC  33.6  30.0 - 36.5 g/dL       RDW  14.3  11.5 - 14.5 %       PLATELET  281  150 - 400 K/uL       MPV  10.3  8.9 - 12.9 FL       NRBC  0.0  0.0 PER 100 WBC       ABSOLUTE NRBC  0.00  0.00 - 0.01 K/uL       NEUTROPHILS  78 (H)  32 - 75 %       LYMPHOCYTES  16  12 - 49 %       MONOCYTES  5  5 - 13 %       EOSINOPHILS  1  0 - 7 %       BASOPHILS  0  0 - 1 %       IMMATURE GRANULOCYTES  0  0 - 0.5 %       ABS. NEUTROPHILS  7.5  1.8 - 8.0 K/UL       ABS. LYMPHOCYTES  1.6  0.8 - 3.5 K/UL       ABS. MONOCYTES  0.5  0.0 - 1.0 K/UL       ABS. EOSINOPHILS  0.1  0.0 - 0.4 K/UL       ABS. BASOPHILS  0.0  0.0 - 0.1 K/UL       ABS. IMM. GRANS.  0.0  0.00 - 0.04 K/UL       DF  AUTOMATED          METABOLIC PANEL, COMPREHENSIVE          Collection Time: 04/25/21  8:22 AM         Result  Value  Ref Range            Sodium  133 (L)  136 - 145 mmol/L       Potassium  2.5 (LL)  3.5 - 5.1 mmol/L       Chloride  94 (L)  97 - 108 mmol/L       CO2  33 (H)  21 - 32 mmol/L       Anion  gap  6  5 - 15 mmol/L       Glucose  86  65 - 100 mg/dL       BUN  6  6 - 20 mg/dL       Creatinine  0.92  0.70 - 1.30 mg/dL       BUN/Creatinine ratio  7 (L)  12 - 20         eGFR  >60  >60 ml/min/1.76m       Calcium  7.8 (L)  8.5 - 10.1 mg/dL       Bilirubin, total  0.5  0.2 - 1.0 mg/dL       AST (SGOT)  30  15 - 37 U/L       ALT (SGPT)  35  12 - 78 U/L       Alk. phosphatase  133  (H)  45 - 117 U/L       Protein, total  5.7 (L)  6.4 - 8.2 g/dL       Albumin  2.5 (L)  3.5 - 5.0 g/dL       Globulin  3.2  2.0 - 4.0 g/dL       A-G Ratio  0.8 (L)  1.1 - 2.2         MAGNESIUM          Collection Time: 04/25/21  8:22 AM         Result  Value  Ref Range            Magnesium  1.7  1.6 - 2.4 mg/dL       LIPASE          Collection Time: 04/25/21  8:22 AM         Result  Value  Ref Range            Lipase  689 (H)  73 - 393 U/L           Results              ** No results found for the last 336 hours. **                      Diagnostic Images:     CT Results  (Last 48 hours)             None                        CT ABDOMEN W WO CONT AND PELVIS W CONT    (Results Pending)                    Current Medications:      Current Facility-Administered Medications:    ?  potassium chloride (K-DUR, KLOR-CON M20) SR tablet 20 mEq, 20 mEq, Oral, BID, Burhanuddin Kohlmann, NP   ?  potassium chloride 10 mEq in 100 ml IVPB, 10 mEq, IntraVENous, Q1H, BVira Browns NP, Last Rate: 100 mL/hr at 04/25/21 1029, 10 mEq at  04/25/21 1029   ?  0.9% sodium chloride with KCl 40 mEq/L infusion, , IntraVENous, CONTINUOUS, BVira Browns NP, Last Rate: 100 mL/hr at 04/25/21 1030,  New Bag at 04/25/21 1030   ?  HYDROmorphone (DILAUDID) injection 1 mg, 1 mg, IntraVENous, Q4H PRN, PPietro CassisF, NP, 1 mg at 04/25/21 04580  ?  LORazepam (ATIVAN) injection 2 mg, 2 mg, IntraVENous, Q4H PRN, PTerrace Arabia NP,  2 mg at 59/56/38 7564   ?  folic acid (FOLVITE) tablet 1 mg, 1 mg, Oral, DAILY, Pietro Cassis F, NP, 1 mg at 04/25/21 3329   ?  insulin lispro (HUMALOG) injection, , SubCUTAneous, AC&HS, Terrace Arabia, NP, 2 Units at 04/24/21 2140   ?  glucose chewable tablet 16 g, 4 Tablet, Oral, PRN, Terrace Arabia, NP   ?  glucagon (GLUCAGEN) injection 1 mg, 1 mg, IntraMUSCular, PRN, Pietro Cassis F, NP   ?  dextrose 10% infusion 0-250 mL, 0-250 mL, IntraVENous, PRN, Terrace Arabia, NP   ?  amLODIPine (NORVASC) tablet  10 mg, 10 mg, Oral, DAILY, Pietro Cassis F, NP, 10 mg at 04/25/21 5188   ?  aspirin delayed-release tablet 81 mg, 81 mg, Oral, DAILY, Pietro Cassis F, NP, 81 mg at 04/25/21 0855   ?  chlordiazePOXIDE (LIBRIUM) capsule 10 mg, 10 mg, Oral, TID, Terrace Arabia, NP, 10 mg at 04/25/21 0854   ?  escitalopram oxalate (LEXAPRO) tablet 20 mg, 20 mg, Oral, DAILY, Pietro Cassis F, NP, 20 mg at 04/25/21 0855   ?  fenofibrate (LOFIBRA) tablet 160 mg, 160 mg, Oral, DAILY, Pietro Cassis F, NP, 160 mg at 04/25/21 0855   ?  pantoprazole (PROTONIX) tablet 40 mg, 40 mg, Oral, ACB, Pietro Cassis F, NP, 40 mg at 04/25/21 0631   ?  pregabalin (LYRICA) capsule 50 mg, 50 mg, Oral, BID, Pietro Cassis F, NP, 50 mg at 04/25/21 0854   ?  sucralfate (CARAFATE) tablet 1 g, 1 g, Oral, AC&HS, Pietro Cassis F, NP, 1 g at 04/25/21 0631   ?  insulin glargine (LANTUS) injection 43 Units, 43 Units, SubCUTAneous, QHS, Terrace Arabia, NP, 43 Units at 04/24/21 2140   ?  sodium chloride (NS) flush 5-40 mL, 5-40 mL, IntraVENous, Q8H, [place], Darlene F, NP, 10 mL at 04/25/21 4166   ?  sodium chloride (NS) flush 5-40 mL, 5-40 mL, IntraVENous, PRN, Terrace Arabia, NP   ?  acetaminophen (TYLENOL) tablet 650 mg, 650 mg, Oral, Q6H PRN **OR** acetaminophen (TYLENOL) suppository 650 mg, 650 mg, Rectal, Q6H PRN,  Terrace Arabia, NP   ?  polyethylene glycol (MIRALAX) packet 17 g, 17 g, Oral, DAILY PRN, Terrace Arabia, NP   ?  ondansetron (ZOFRAN ODT) tablet 4 mg, 4 mg, Oral, Q8H PRN **OR** ondansetron (ZOFRAN) injection 4 mg, 4 mg, IntraVENous, Q6H PRN, Pietro Cassis F, NP   ?  enoxaparin (LOVENOX) injection 40 mg, 40 mg, SubCUTAneous, DAILY, Pietro Cassis F, NP, 40 mg at 04/25/21 0630   ?  metoprolol tartrate (LOPRESSOR) tablet 25 mg, 25 mg, Oral, Q12H, Pietro Cassis F, NP, 25 mg at 04/25/21 0855   ?  insulin lispro (HUMALOG) injection 8 Units, 8 Units, SubCUTAneous, TIDAC, Dayle Points, MD, 8 Units at  04/24/21 1648          ASSESSMENT:   Antonio Bast Sr. is a 61 y.o. male who  has a past medical history significant for hypertension, type 2 diabetes, GERD,hyperlipidemia, and neuropathy. He was transferred from free standing ER.  He reports he is going through a divorce and started drinking alcohol about 2 weeks ago. He admits to drinking 8 bottles of wine daily. He does report abdominal pain with tenderness to light palpation. He states his last alcoholic drink was on Saturday.  He has been visiting family in [place]. He does report nausea and vomiting. He states his appetite has been  poor. He states he has not had any of his medications in over two weeks. He is being admitted for alcohol withdrawal for further evaluation  and management. Recent evaluation showed elevated D Dimer, CTA was negative at that time.       Labs on admission: WBC 5.7, HgB 11.8, Platelet 303, urinalysis unremarkable, Sodium 127, Potassium 2.4 given 10 meq of IV potassium followed by 40 meq of potassium oral, Started on 0.9% Normal Saline  at 100 ml/hr.  Lipase 308. CT of abdomen pending.               EtOH abuse   - CIWA Protocol   -Ativan 2 mg Ever  4 hours as needed   -folic Acid 1 mg daily   -Normal saline with potassium   -Librium 10 mg  oral TID       Acute pancreatitis   -Secondary to EtOH abuse   -Continue gentle IV hydration   -Repeat labs in a.m.      Hypertension   -Amlodipine 10 mg daily   -Metoprolol 25 mg BID   -BP currently at goal       Type 2 Diabetes   -AC/at bedtime Accu-Chek   -sliding scale coverage, lantus       Nausea/Vomiting/Dehydration   -zofran as needed   -Continue IV hydration   -NPO except medications       Neuropathy   -Lyrica 50 mg twice daily       GERD   -Protonix 40 mg oral daily   -Carafate 1 gm tablet before meals       Hyponatremia   - replenish with gentle IV hydration   -CMP in the am       Hypokalemia   -replenish with PO and IV K            Full Code   Dvt Prophylaxis: Lovenox   GI  Prophylaxis: protonix   Discharge barriers: repeat labs in am, IV/PO K      Above treatment plan reviewed and discussed with patient in detail at bedside, all questions answered.      Care Plan discussed with: Patient/Family      Total time spent with patient: 35 minutes.      Vira Browns, NP

## 2021-04-26 ENCOUNTER — Inpatient Hospital Stay: Admit: 2021-04-26 | Payer: MEDICAID | Primary: Family Medicine

## 2021-04-26 LAB — CBC WITH AUTOMATED DIFF
ABS. BASOPHILS: 0 10*3/uL (ref 0.0–0.1)
ABS. EOSINOPHILS: 0.1 10*3/uL (ref 0.0–0.4)
ABS. IMM. GRANS.: 0 10*3/uL (ref 0.00–0.04)
ABS. LYMPHOCYTES: 2.2 10*3/uL (ref 0.8–3.5)
ABS. MONOCYTES: 0.4 10*3/uL (ref 0.0–1.0)
ABS. NEUTROPHILS: 3.2 10*3/uL (ref 1.8–8.0)
ABSOLUTE NRBC: 0 10*3/uL (ref 0.00–0.01)
BASOPHILS: 0 % (ref 0–1)
EOSINOPHILS: 1 % (ref 0–7)
HCT: 33.3 % — ABNORMAL LOW (ref 36.6–50.3)
HGB: 10.8 g/dL — ABNORMAL LOW (ref 12.1–17.0)
IMMATURE GRANULOCYTES: 0 % (ref 0–0.5)
LYMPHOCYTES: 38 % (ref 12–49)
MCH: 27.4 PG (ref 26.0–34.0)
MCHC: 32.4 g/dL (ref 30.0–36.5)
MCV: 84.5 FL (ref 80.0–99.0)
MONOCYTES: 7 % (ref 5–13)
MPV: 9.9 FL (ref 8.9–12.9)
NEUTROPHILS: 54 % (ref 32–75)
NRBC: 0 PER 100 WBC
PLATELET: 305 10*3/uL (ref 150–400)
RBC: 3.94 M/uL — ABNORMAL LOW (ref 4.10–5.70)
RDW: 14.2 % (ref 11.5–14.5)
WBC: 5.9 10*3/uL (ref 4.1–11.1)

## 2021-04-26 LAB — GLUCOSE, POC
Glucose (POC): 222 mg/dL — ABNORMAL HIGH (ref 65–100)
Glucose (POC): 68 mg/dL (ref 65–100)
Glucose (POC): 83 mg/dL (ref 65–100)
Glucose (POC): 125 mg/dL — ABNORMAL HIGH (ref 65–100)
Glucose (POC): 117 mg/dL — ABNORMAL HIGH (ref 65–100)

## 2021-04-26 LAB — METABOLIC PANEL, COMPREHENSIVE
A-G Ratio: 0.7 — ABNORMAL LOW (ref 1.1–2.2)
ALT (SGPT): 33 U/L (ref 12–78)
AST (SGOT): 35 U/L (ref 15–37)
Albumin: 2.3 g/dL — ABNORMAL LOW (ref 3.5–5.0)
Alk. phosphatase: 110 U/L (ref 45–117)
Anion gap: 5 mmol/L (ref 5–15)
BUN/Creatinine ratio: 6 — ABNORMAL LOW (ref 12–20)
BUN: 5 mg/dL — ABNORMAL LOW (ref 6–20)
Bilirubin, total: 0.4 mg/dL (ref 0.2–1.0)
CO2: 28 mmol/L (ref 21–32)
Calcium: 8.2 mg/dL — ABNORMAL LOW (ref 8.5–10.1)
Chloride: 103 mmol/L (ref 97–108)
Creatinine: 0.86 mg/dL (ref 0.70–1.30)
Globulin: 3.5 g/dL (ref 2.0–4.0)
Glucose: 59 mg/dL — ABNORMAL LOW (ref 65–100)
Potassium: 3 mmol/L — ABNORMAL LOW (ref 3.5–5.1)
Protein, total: 5.8 g/dL — ABNORMAL LOW (ref 6.4–8.2)
Sodium: 136 mmol/L (ref 136–145)
eGFR: >60 mL/min/{1.73_m2} (ref >60)

## 2021-04-26 LAB — VITAMIN B12 & FOLATE
Folate: 5.5 ng/mL (ref 5.0–21.0)
Folate: 5.5 ng/mL (ref 5.0–21.0)
Vitamin B-12: 361 pg/mL (ref 193–986)
Vitamin B12: 361 pg/mL (ref 193–986)

## 2021-04-26 LAB — CBC WITH AUTO DIFFERENTIAL
Basophils %: 0 % (ref 0–1)
Basophils Absolute: 0 10*3/uL (ref 0.0–0.1)
Eosinophils %: 1 % (ref 0–7)
Eosinophils Absolute: 0.1 10*3/uL (ref 0.0–0.4)
Granulocyte Absolute Count: 0 10*3/uL (ref 0.00–0.04)
Hematocrit: 33.3 % — ABNORMAL LOW (ref 36.6–50.3)
Hemoglobin: 10.8 g/dL — ABNORMAL LOW (ref 12.1–17.0)
Immature Granulocytes: 0 % (ref 0–0.5)
Lymphocytes %: 38 % (ref 12–49)
Lymphocytes Absolute: 2.2 10*3/uL (ref 0.8–3.5)
MCH: 27.4 PG (ref 26.0–34.0)
MCHC: 32.4 g/dL (ref 30.0–36.5)
MCV: 84.5 FL (ref 80.0–99.0)
MPV: 9.9 FL (ref 8.9–12.9)
Monocytes %: 7 % (ref 5–13)
Monocytes Absolute: 0.4 10*3/uL (ref 0.0–1.0)
NRBC Absolute: 0 10*3/uL (ref 0.00–0.01)
Neutrophils %: 54 % (ref 32–75)
Neutrophils Absolute: 3.2 10*3/uL (ref 1.8–8.0)
Nucleated RBCs: 0 PER 100 WBC
Platelets: 305 10*3/uL (ref 150–400)
RBC: 3.94 M/uL — ABNORMAL LOW (ref 4.10–5.70)
RDW: 14.2 % (ref 11.5–14.5)
WBC: 5.9 10*3/uL (ref 4.1–11.1)

## 2021-04-26 LAB — POCT GLUCOSE
POC Glucose: 222 mg/dL — ABNORMAL HIGH (ref 65–100)
POC Glucose: 68 mg/dL (ref 65–100)
POC Glucose: 83 mg/dL (ref 65–100)
POC Glucose: 125 mg/dL — ABNORMAL HIGH (ref 65–100)
POC Glucose: 117 mg/dL — ABNORMAL HIGH (ref 65–100)

## 2021-04-26 LAB — COMPREHENSIVE METABOLIC PANEL
ALT: 33 U/L (ref 12–78)
AST: 35 U/L (ref 15–37)
Albumin/Globulin Ratio: 0.7 — ABNORMAL LOW (ref 1.1–2.2)
Albumin: 2.3 g/dL — ABNORMAL LOW (ref 3.5–5.0)
Alkaline Phosphatase: 110 U/L (ref 45–117)
Anion Gap: 5 mmol/L (ref 5–15)
BUN: 5 mg/dL — ABNORMAL LOW (ref 6–20)
Bun/Cre Ratio: 6 — ABNORMAL LOW (ref 12–20)
CO2: 28 mmol/L (ref 21–32)
Calcium: 8.2 mg/dL — ABNORMAL LOW (ref 8.5–10.1)
Chloride: 103 mmol/L (ref 97–108)
Creatinine: 0.86 mg/dL (ref 0.70–1.30)
ESTIMATED GLOMERULAR FILTRATION RATE: >60 mL/min/{1.73_m2} (ref >60)
Globulin: 3.5 g/dL (ref 2.0–4.0)
Glucose: 59 mg/dL — ABNORMAL LOW (ref 65–100)
Potassium: 3 mmol/L — ABNORMAL LOW (ref 3.5–5.1)
Sodium: 136 mmol/L (ref 136–145)
Total Bilirubin: 0.4 mg/dL (ref 0.2–1.0)
Total Protein: 5.8 g/dL — ABNORMAL LOW (ref 6.4–8.2)

## 2021-04-26 MED ORDER — DIATRIZOATE MEGLUMINE & SODIUM 66 %-10 % ORAL SOLN
66-10 % | Freq: Once | ORAL | Status: AC
Start: 2021-04-26 — End: 2021-04-26
  Administered 2021-04-26: 22:00:00 via ORAL

## 2021-04-26 MED ORDER — IOPAMIDOL 76 % IV SOLN
370 mg iodine /mL (76 %) | Freq: Once | INTRAVENOUS | Status: AC
Start: 2021-04-26 — End: 2021-04-26
  Administered 2021-04-26: via INTRAVENOUS

## 2021-04-26 MED ORDER — THIAMINE MONONITRATE 100 MG TABLET
100 mg | Freq: Every day | ORAL | Status: AC
Start: 2021-04-26 — End: 2021-04-27
  Administered 2021-04-26 – 2021-04-27 (×2): via ORAL

## 2021-04-26 MED ORDER — POTASSIUM CHLORIDE SR 20 MEQ TAB, PARTICLES/CRYSTALS
20 mEq | ORAL | Status: AC
Start: 2021-04-26 — End: 2021-04-26
  Administered 2021-04-26 (×2): via ORAL

## 2021-04-26 MED ORDER — PANTOPRAZOLE 40 MG TAB, DELAYED RELEASE
40 mg | Freq: Two times a day (BID) | ORAL | Status: AC
Start: 2021-04-26 — End: 2021-04-27
  Administered 2021-04-26 – 2021-04-27 (×2): via ORAL

## 2021-04-26 MED ORDER — HYDROMORPHONE 1 MG/ML INJECTION SOLUTION
1 mg/mL | INTRAMUSCULAR | Status: DC | PRN
Start: 2021-04-26 — End: 2021-04-27
  Administered 2021-04-26 – 2021-04-27 (×4): via INTRAVENOUS

## 2021-04-26 MED ORDER — MAGNESIUM SULFATE 2 GRAM/50 ML IVPB
2 gram/50 mL (4 %) | Freq: Once | INTRAVENOUS | Status: AC
Start: 2021-04-26 — End: 2021-04-26
  Administered 2021-04-26: 20:00:00 via INTRAVENOUS

## 2021-04-26 MED FILL — HYDROMORPHONE (PF) 1 MG/ML IJ SOLN: 1 mg/mL | INTRAMUSCULAR | Qty: 1

## 2021-04-26 MED FILL — PANTOPRAZOLE 20 MG TAB, DELAYED RELEASE: 20 mg | ORAL | Qty: 2

## 2021-04-26 MED FILL — HUMALOG U-100 INSULIN 100 UNIT/ML SUBCUTANEOUS SOLUTION: 100 unit/mL | SUBCUTANEOUS | Qty: 6

## 2021-04-26 MED FILL — MAGNESIUM SULFATE 2 GRAM/50 ML IVPB: 2 gram/50 mL (4 %) | INTRAVENOUS | Qty: 50

## 2021-04-26 MED FILL — ISOVUE-370  76 % INTRAVENOUS SOLUTION: 370 mg iodine /mL (76 %) | INTRAVENOUS | Qty: 100

## 2021-04-26 MED FILL — FOLIC ACID 1 MG TAB: 1 mg | ORAL | Qty: 1

## 2021-04-26 MED FILL — NS WITH POTASSIUM CHLORIDE 40 MEQ/L IV: 40 mEq/L | INTRAVENOUS | Qty: 1000

## 2021-04-26 MED FILL — POTASSIUM CHLORIDE SR 20 MEQ TAB, PARTICLES/CRYSTALS: 20 mEq | ORAL | Qty: 2

## 2021-04-26 MED FILL — ASPIRIN 81 MG TAB, DELAYED RELEASE: 81 mg | ORAL | Qty: 1

## 2021-04-26 MED FILL — ONDANSETRON (PF) 4 MG/2 ML INJECTION: 4 mg/2 mL | INTRAMUSCULAR | Qty: 2

## 2021-04-26 MED FILL — SUCRALFATE 1 GRAM TAB: 1 gram | ORAL | Qty: 1

## 2021-04-26 MED FILL — CHLORDIAZEPOXIDE 5 MG CAP: 5 mg | ORAL | Qty: 2

## 2021-04-26 MED FILL — PANTOPRAZOLE 40 MG TAB, DELAYED RELEASE: 40 mg | ORAL | Qty: 1

## 2021-04-26 MED FILL — THIAMINE MONONITRATE 100 MG TABLET: 100 mg | ORAL | Qty: 1

## 2021-04-26 MED FILL — GASTROGRAFIN 66 %-10 % ORAL SOLUTION: 66-10 % | ORAL | Qty: 30

## 2021-04-26 MED FILL — AMLODIPINE 5 MG TAB: 5 mg | ORAL | Qty: 2

## 2021-04-26 MED FILL — PREGABALIN 50 MG CAP: 50 mg | ORAL | Qty: 1

## 2021-04-26 MED FILL — POTASSIUM CHLORIDE SR 20 MEQ TAB, PARTICLES/CRYSTALS: 20 mEq | ORAL | Qty: 1

## 2021-04-26 MED FILL — METOPROLOL TARTRATE 25 MG TAB: 25 mg | ORAL | Qty: 1

## 2021-04-26 MED FILL — FENOFIBRATE 160 MG TAB: 160 mg | ORAL | Qty: 1

## 2021-04-26 MED FILL — LORAZEPAM 2 MG/ML IJ SOLN: 2 mg/mL | INTRAMUSCULAR | Qty: 1

## 2021-04-26 MED FILL — ESCITALOPRAM 10 MG TAB: 10 mg | ORAL | Qty: 2

## 2021-04-26 MED FILL — ENOXAPARIN 40 MG/0.4 ML SUB-Q SYRINGE: 40 mg/0.4 mL | SUBCUTANEOUS | Qty: 0.4

## 2021-04-26 MED FILL — LANTUS U-100 INSULIN 100 UNIT/ML SUBCUTANEOUS SOLUTION: 100 unit/mL | SUBCUTANEOUS | Qty: 43

## 2021-04-26 NOTE — Progress Notes (Signed)
Progress Notes by Ludwig Clarks, MD at 04/26/21 1241                Author: Ludwig Clarks, MD  Service: Hospitalist  Author Type: Physician       Filed: 04/26/21 1303  Date of Service: 04/26/21 1241  Status: Signed          Editor: Celine Ahr Doran Heater, MD (Physician)                          Hospitalist Progress Note                   Daily Progress Note: 04/26/2021           Subjective:     Hospital course to date:     61 y.o. male who  has a past medical history significant for hypertension, type 2 diabetes, GERD,hyperlipidemia, and neuropathy. He was transferred  from free standing ER. He reports he is going through a divorce and started drinking alcohol about 2 weeks ago. He admits to drinking 8 bottles of wine daily. He does report abdominal pain with tenderness to light palpation. He states his last alcoholic  drink was on Saturday. He has been visiting family in [place]. He does report nausea and vomiting      Labs on admission showed a sodium of 127, lipase 308.      He was admitted, started on CIWA protocol.      ------   Patient is seen for follow-up.  Still very tremulous and shaky.      Requesting something for acid reflux      Problem List:      Problem List as of 04/26/2021  Date Reviewed: 04/24/2021                           Codes  Class  Noted - Resolved             Dehydration  ICD-10-CM: E86.0   ICD-9-CM: 276.51    04/24/2021 - Present                       * (Principal) Hypokalemia  ICD-10-CM: E87.6   ICD-9-CM: 276.8    04/24/2021 - Present                       Nausea and vomiting  ICD-10-CM: R11.2   ICD-9-CM: 787.01    04/24/2021 - Present                       ETOH abuse  ICD-10-CM: F10.10   ICD-9-CM: 305.00    04/24/2021 - Present                       Acute cystitis with hematuria  ICD-10-CM: N30.01   ICD-9-CM: 595.0    08/20/2020 - Present                       Acute pancreatitis  ICD-10-CM: K85.90   ICD-9-CM: 578.4    08/19/2020 - Present                       Pancreatitis   ICD-10-CM: K85.90   ICD-9-CM: 696.2    08/18/2020 - Present  Acute alcoholic gastritis  PJK-93-OI: K29.20   ICD-9-CM: 535.30    08/05/2020 - Present                       Withdrawal symptoms, alcohol ([place])  ICD-10-CM: F10.939   ICD-9-CM: 291.81    08/03/2020 - Present                       Alcohol withdrawal ([place])  ICD-10-CM: Z12.458   ICD-9-CM: 291.81    08/03/2020 - Present                       Alcohol abuse  ICD-10-CM: F10.10   ICD-9-CM: 305.00    10/27/2019 - Present                       Essential hypertension  ICD-10-CM: I10   ICD-9-CM: 401.9    10/27/2019 - Present                       Diabetes mellitus type 2 with complications, uncontrolled  ICD-10-CM: KDX8338   ICD-9-CM: 250.92    01/14/2019 - Present                       Atherosclerosis of coronary artery  ICD-10-CM: I25.10   ICD-9-CM: 414.00    06/15/2013 - Present          Overview Signed 08/20/2020 11:29 AM by Alain Honey, PA            Formatting of this note might be different from the original.   ?? Cardiac cath 11/24/12: Normal LM, 20% proximal and mid LAD. LCx dominant with minor luminal irregularites. RCA small, nondominant. LVEF 50-55%. LVEDP normal.   ?? Cardiac catheterization 07/01/20 [facility]): LM 30%, LAD prox 20%, mid 20%, D1 (small) 30%, D2 (small) 50%, LCX mid 20%, OM3 (small) 70%, RCA prox 20%.                                   GERD (gastroesophageal reflux disease)  ICD-10-CM: K21.9   ICD-9-CM: 530.81    10/08/2011 - Present                     Medications reviewed     Current Facility-Administered Medications          Medication  Dose  Route  Frequency           ?  potassium chloride (K-DUR, KLOR-CON M20) SR tablet 40 mEq   40 mEq  Oral  Q1H     ?  thiamine mononitrate (B-1) tablet 100 mg   100 mg  Oral  DAILY     ?  potassium chloride (K-DUR, KLOR-CON M20) SR tablet 20 mEq   20 mEq  Oral  BID     ?  0.9% sodium chloride with KCl 40 mEq/L infusion     IntraVENous  CONTINUOUS     ?  LORazepam (ATIVAN) injection 2 mg   2  mg  IntraVENous  Q4H PRN     ?  folic acid (FOLVITE) tablet 1 mg   1 mg  Oral  DAILY     ?  insulin lispro (HUMALOG) injection     SubCUTAneous  AC&HS     ?  glucose chewable tablet 16  g   4 Tablet  Oral  PRN     ?  glucagon (GLUCAGEN) injection 1 mg   1 mg  IntraMUSCular  PRN     ?  dextrose 10% infusion 0-250 mL   0-250 mL  IntraVENous  PRN     ?  amLODIPine (NORVASC) tablet 10 mg   10 mg  Oral  DAILY     ?  aspirin delayed-release tablet 81 mg   81 mg  Oral  DAILY     ?  chlordiazePOXIDE (LIBRIUM) capsule 10 mg   10 mg  Oral  TID     ?  escitalopram oxalate (LEXAPRO) tablet 20 mg   20 mg  Oral  DAILY     ?  fenofibrate (LOFIBRA) tablet 160 mg   160 mg  Oral  DAILY     ?  pantoprazole (PROTONIX) tablet 40 mg   40 mg  Oral  ACB     ?  pregabalin (LYRICA) capsule 50 mg   50 mg  Oral  BID     ?  sucralfate (CARAFATE) tablet 1 g   1 g  Oral  AC&HS     ?  insulin glargine (LANTUS) injection 43 Units   43 Units  SubCUTAneous  QHS     ?  sodium chloride (NS) flush 5-40 mL   5-40 mL  IntraVENous  Q8H     ?  sodium chloride (NS) flush 5-40 mL   5-40 mL  IntraVENous  PRN     ?  acetaminophen (TYLENOL) tablet 650 mg   650 mg  Oral  Q6H PRN          Or           ?  acetaminophen (TYLENOL) suppository 650 mg   650 mg  Rectal  Q6H PRN     ?  polyethylene glycol (MIRALAX) packet 17 g   17 g  Oral  DAILY PRN     ?  ondansetron (ZOFRAN ODT) tablet 4 mg   4 mg  Oral  Q8H PRN          Or           ?  ondansetron (ZOFRAN) injection 4 mg   4 mg  IntraVENous  Q6H PRN     ?  enoxaparin (LOVENOX) injection 40 mg   40 mg  SubCUTAneous  DAILY     ?  metoprolol tartrate (LOPRESSOR) tablet 25 mg   25 mg  Oral  Q12H           ?  insulin lispro (HUMALOG) injection 8 Units   8 Units  SubCUTAneous  TIDAC             Review of Systems:     A comprehensive review of systems was negative except for that written in the HPI.        Objective:     Physical Exam:       Visit Vitals      BP  (!) 144/75 (BP 1 Location: Right upper arm, BP Patient  Position: At rest)     Pulse  80     Temp  97.4 ??F (36.3 ??C)     Resp  18     Ht  '5\' 9"'  (1.753 m)     Wt  83.9 kg (185 lb)     SpO2  98%        BMI  27.32 kg/m??  O2 Device: None (Room air)      Temp (24hrs), Avg:98 ??F (36.7 ??C), Min:97.4 ??F (36.3 ??C), Max:98.6 ??F (37 ??C)     03/01 0701 - 03/01 1900   In: -    Out: 700 [Urine:700]   02/27 1901 - 03/01 0700   In: -    Out: 650 [Urine:650]         General:    Awake and alert, appears weak        Lungs:    Clear to auscultation bilaterally.        Chest wall:   No tenderness or deformity.        Heart:   Regular rate and rhythm, S1, S2 normal, no murmur, click, rub or gallop.        Abdomen:    Soft, non-tender. Bowel sounds normal. No masses,  No organomegaly.     Extremities:  Extremities normal, atraumatic, no cyanosis or edema.     Pulses:  2+ and symmetric all extremities.     Skin:  Skin color, texture, turgor normal. No rashes or lesions        Neurologic:  CNII-XII intact.  Patient very tremulous still                Data Review:          Recent Days:     Recent Labs             04/26/21   0742  04/25/21   0822  04/24/21   0445     WBC  5.9  9.7  5.7     HGB  10.8*  10.8*  11.8*     HCT  33.3*  32.1*  34.6*          PLT  305  281  303          Recent Labs             04/26/21   0742  04/25/21   0822  04/24/21   0445     NA  136  133*  127*     K  3.0*  2.5*  2.4*     CL  103  94*  84*     CO2  28  33*  30     GLU  59*  86  491*     BUN  5*  6  7     CREA  0.86  0.92  1.30     CA  8.2*  7.8*  8.3*     MG   --   1.7  1.4*     ALB  2.3*  2.5*  3.1*     TBILI  0.4  0.5  0.3          ALT  33  35  51        No results for input(s): PH, PCO2, PO2, HCO3, FIO2 in the last 72 hours.      24 Hour Results:     Recent Results (from the past 24 hour(s))     GLUCOSE, POC          Collection Time: 04/25/21  3:58 PM         Result  Value  Ref Range            Glucose (POC)  100  65 - 100 mg/dL       Performed by  Carmina Miller  GLUCOSE, POC          Collection  Time: 04/25/21  8:01 PM         Result  Value  Ref Range            Glucose (POC)  222 (H)  65 - 100 mg/dL       Performed by  Leron Croak         GLUCOSE, POC          Collection Time: 04/26/21  7:32 AM         Result  Value  Ref Range            Glucose (POC)  68  65 - 100 mg/dL       Performed by  Ricardo Jericho         CBC WITH AUTOMATED DIFF          Collection Time: 04/26/21  7:42 AM         Result  Value  Ref Range            WBC  5.9  4.1 - 11.1 K/uL       RBC  3.94 (L)  4.10 - 5.70 M/uL       HGB  10.8 (L)  12.1 - 17.0 g/dL       HCT  33.3 (L)  36.6 - 50.3 %       MCV  84.5  80.0 - 99.0 FL       MCH  27.4  26.0 - 34.0 PG       MCHC  32.4  30.0 - 36.5 g/dL       RDW  14.2  11.5 - 14.5 %       PLATELET  305  150 - 400 K/uL       MPV  9.9  8.9 - 12.9 FL       NRBC  0.0  0.0 PER 100 WBC       ABSOLUTE NRBC  0.00  0.00 - 0.01 K/uL       NEUTROPHILS  54  32 - 75 %       LYMPHOCYTES  38  12 - 49 %       MONOCYTES  7  5 - 13 %       EOSINOPHILS  1  0 - 7 %       BASOPHILS  0  0 - 1 %       IMMATURE GRANULOCYTES  0  0 - 0.5 %       ABS. NEUTROPHILS  3.2  1.8 - 8.0 K/UL       ABS. LYMPHOCYTES  2.2  0.8 - 3.5 K/UL       ABS. MONOCYTES  0.4  0.0 - 1.0 K/UL       ABS. EOSINOPHILS  0.1  0.0 - 0.4 K/UL       ABS. BASOPHILS  0.0  0.0 - 0.1 K/UL       ABS. IMM. GRANS.  0.0  0.00 - 0.04 K/UL       DF  AUTOMATED          METABOLIC PANEL, COMPREHENSIVE          Collection Time: 04/26/21  7:42 AM         Result  Value  Ref Range            Sodium  136  136 - 145 mmol/L  Potassium  3.0 (L)  3.5 - 5.1 mmol/L       Chloride  103  97 - 108 mmol/L       CO2  28  21 - 32 mmol/L       Anion gap  5  5 - 15 mmol/L       Glucose  59 (L)  65 - 100 mg/dL       BUN  5 (L)  6 - 20 mg/dL       Creatinine  0.86  0.70 - 1.30 mg/dL       BUN/Creatinine ratio  6 (L)  12 - 20         eGFR  >60  >60 ml/min/1.7m       Calcium  8.2 (L)  8.5 - 10.1 mg/dL       Bilirubin, total  0.4  0.2 - 1.0 mg/dL       AST (SGOT)  35  15 - 37 U/L       ALT  (SGPT)  33  12 - 78 U/L       Alk. phosphatase  110  45 - 117 U/L       Protein, total  5.8 (L)  6.4 - 8.2 g/dL       Albumin  2.3 (L)  3.5 - 5.0 g/dL       Globulin  3.5  2.0 - 4.0 g/dL       A-G Ratio  0.7 (L)  1.1 - 2.2         GLUCOSE, POC          Collection Time: 04/26/21  8:48 AM         Result  Value  Ref Range            Glucose (POC)  83  65 - 100 mg/dL       Performed by  SCoats POC          Collection Time: 04/26/21 11:32 AM         Result  Value  Ref Range            Glucose (POC)  125 (H)  65 - 100 mg/dL            Performed by  COX ANGELA               CT ABDOMEN W WO CONT AND PELVIS W CONT    (Results Pending)            Assessment:   Alcohol abuse with acute alcohol withdrawal syndrome      Acute alcoholic pancreatitis      Essential hypertension.  On amlodipine and metoprolol      Diabetes mellitus type 2      Diabetic neuropathy      GERD      Hypokalemia      Hyponatremia on admission, likely due to dehydration      Moderate protein calorie malnutrition         Discussion/MDM: Patient with multiple medical comorbidities, each with high likelihood for morbidity and mortality if left untreated.   I have reviewed patient's presenting subjective and objective  findings, as well as all laboratory studies, imaging studies, and vital signs to date as well as treatment rendered and patient's response to those treatments.  In addition, prior medical, surgical and relevant social and family histories were reviewed.  Patient continues on CIWA alcohol withdrawal protocol, high risk for clinical deterioration and delirium tremens if left untreated      Plan:   Continue IV fluids  Add oral thiamine  Continue CIWA protocol   Recheck lipase in a.m.   Pleat potassium   We will give magnesium sulfate empirically, level was only 1.7 yesterday   Patient also complaining acid reflux, will start Protonix for that      Care Plan discussed with: Patient/Family      Code status: Full  code    Social determinants of health: None known      Disposition: Continued inpatient care   Total time spent with patient: 30 minutes.      Ludwig Clarks, MD

## 2021-04-26 NOTE — Progress Notes (Signed)
CM reviewed chart. Patient current discharge plan is home self care at this time when medically stable.

## 2021-04-26 NOTE — Progress Notes (Signed)
 Problem: Falls - Risk of  Goal: *Absence of Falls  Description: Document Deloris Fall Risk and appropriate interventions in the flowsheet.  Outcome: Progressing Towards Goal  Note: Fall Risk Interventions:                                Problem: Nausea/Vomiting (Adult)  Goal: *Absence of nausea/vomiting  Outcome: Progressing Towards Goal     Problem: Pain  Goal: *Control of Pain  Outcome: Progressing Towards Goal     Problem: Alcohol Withdrawal  Goal: *STG: Participates in treatment plan  Outcome: Progressing Towards Goal

## 2021-04-27 LAB — GLUCOSE, POC
Glucose (POC): 222 mg/dL — ABNORMAL HIGH (ref 65–100)
Glucose (POC): 58 mg/dL — ABNORMAL LOW (ref 65–100)
Glucose (POC): 162 mg/dL — ABNORMAL HIGH (ref 65–100)
Glucose (POC): 111 mg/dL — ABNORMAL HIGH (ref 65–100)

## 2021-04-27 LAB — RENAL FUNCTION PANEL
Albumin: 2.5 g/dL — ABNORMAL LOW (ref 3.5–5.0)
Albumin: 2.5 g/dL — ABNORMAL LOW (ref 3.5–5.0)
Anion Gap: 4 mmol/L — ABNORMAL LOW (ref 5–15)
Anion gap: 4 mmol/L — ABNORMAL LOW (ref 5–15)
BUN/Creatinine ratio: 2 — ABNORMAL LOW (ref 12–20)
BUN: 2 mg/dL — ABNORMAL LOW (ref 6–20)
BUN: 2 mg/dL — ABNORMAL LOW (ref 6–20)
Bun/Cre Ratio: 2 — ABNORMAL LOW (ref 12–20)
CO2: 27 mmol/L (ref 21–32)
CO2: 27 mmol/L (ref 21–32)
Calcium: 8.6 mg/dL (ref 8.5–10.1)
Calcium: 8.6 mg/dL (ref 8.5–10.1)
Chloride: 106 mmol/L (ref 97–108)
Chloride: 106 mmol/L (ref 97–108)
Creatinine: 0.83 mg/dL (ref 0.70–1.30)
Creatinine: 0.83 mg/dL (ref 0.70–1.30)
ESTIMATED GLOMERULAR FILTRATION RATE: >60 mL/min/{1.73_m2} (ref >60)
Glucose: 58 mg/dL — ABNORMAL LOW (ref 65–100)
Glucose: 58 mg/dL — ABNORMAL LOW (ref 65–100)
Phosphorus: 1.6 mg/dL — ABNORMAL LOW (ref 2.6–4.7)
Phosphorus: 1.6 mg/dL — ABNORMAL LOW (ref 2.6–4.7)
Potassium: 4.2 mmol/L (ref 3.5–5.1)
Potassium: 4.2 mmol/L (ref 3.5–5.1)
Sodium: 137 mmol/L (ref 136–145)
Sodium: 137 mmol/L (ref 136–145)
eGFR: >60 mL/min/{1.73_m2} (ref >60)

## 2021-04-27 LAB — LIPASE
Lipase: 309 U/L (ref 73–393)
Lipase: 309 U/L (ref 73–393)

## 2021-04-27 LAB — MAGNESIUM
Magnesium: 1.8 mg/dL (ref 1.6–2.4)
Magnesium: 1.8 mg/dL (ref 1.6–2.4)

## 2021-04-27 LAB — POCT GLUCOSE
POC Glucose: 222 mg/dL — ABNORMAL HIGH (ref 65–100)
POC Glucose: 58 mg/dL — ABNORMAL LOW (ref 65–100)
POC Glucose: 162 mg/dL — ABNORMAL HIGH (ref 65–100)
POC Glucose: 111 mg/dL — ABNORMAL HIGH (ref 65–100)

## 2021-04-27 MED FILL — POTASSIUM CHLORIDE SR 20 MEQ TAB, PARTICLES/CRYSTALS: 20 mEq | ORAL | Qty: 1

## 2021-04-27 MED FILL — CHLORDIAZEPOXIDE 5 MG CAP: 5 mg | ORAL | Qty: 2

## 2021-04-27 MED FILL — NS WITH POTASSIUM CHLORIDE 40 MEQ/L IV: 40 mEq/L | INTRAVENOUS | Qty: 1000

## 2021-04-27 MED FILL — ESCITALOPRAM 10 MG TAB: 10 mg | ORAL | Qty: 2

## 2021-04-27 MED FILL — SUCRALFATE 1 GRAM TAB: 1 gram | ORAL | Qty: 1

## 2021-04-27 MED FILL — HYDROMORPHONE (PF) 1 MG/ML IJ SOLN: 1 mg/mL | INTRAMUSCULAR | Qty: 1

## 2021-04-27 MED FILL — PANTOPRAZOLE 40 MG TAB, DELAYED RELEASE: 40 mg | ORAL | Qty: 1

## 2021-04-27 MED FILL — ONDANSETRON (PF) 4 MG/2 ML INJECTION: 4 mg/2 mL | INTRAMUSCULAR | Qty: 2

## 2021-04-27 MED FILL — FENOFIBRATE 160 MG TAB: 160 mg | ORAL | Qty: 1

## 2021-04-27 MED FILL — LANTUS U-100 INSULIN 100 UNIT/ML SUBCUTANEOUS SOLUTION: 100 unit/mL | SUBCUTANEOUS | Qty: 43

## 2021-04-27 MED FILL — FOLIC ACID 1 MG TAB: 1 mg | ORAL | Qty: 1

## 2021-04-27 MED FILL — AMLODIPINE 5 MG TAB: 5 mg | ORAL | Qty: 2

## 2021-04-27 MED FILL — THIAMINE MONONITRATE 100 MG TABLET: 100 mg | ORAL | Qty: 1

## 2021-04-27 MED FILL — ASPIRIN 81 MG TAB, DELAYED RELEASE: 81 mg | ORAL | Qty: 1

## 2021-04-27 MED FILL — METOPROLOL TARTRATE 25 MG TAB: 25 mg | ORAL | Qty: 1

## 2021-04-27 MED FILL — PREGABALIN 50 MG CAP: 50 mg | ORAL | Qty: 1

## 2021-04-27 MED FILL — LORAZEPAM 2 MG/ML IJ SOLN: 2 mg/mL | INTRAMUSCULAR | Qty: 1

## 2021-04-27 MED FILL — TYLENOL 325 MG TABLET: 325 mg | ORAL | Qty: 2

## 2021-04-27 MED FILL — ENOXAPARIN 40 MG/0.4 ML SUB-Q SYRINGE: 40 mg/0.4 mL | SUBCUTANEOUS | Qty: 0.4

## 2021-04-27 NOTE — Discharge Summary (Signed)
Physician Discharge Summary     Patient ID:    Antonio Melendez  [id number]  61 y.o.  1960/10/19    Admit date: 04/24/2021    Discharge date : 04/27/2021      Final Diagnoses:   Dehydration [E86.0]  Hypokalemia [E87.6]  Nausea and vomiting [R11.2]  ETOH abuse [F10.10]  Alcoholic pancreatitis  Alcohol withdrawal syndrome  Medical noncompliance  Essential hypertension  GERD  Diabetes mellitus type 2  Hyperlipidemia  Diabetic neuropathy    Reason for Hospitalization:  61 y.o. male who  has a past medical history significant for hypertension, type 2 diabetes, GERD,hyperlipidemia, and neuropathy. He was transferred from free standing ER. He reports he is going through a divorce and started drinking alcohol about 2 weeks ago. He admits to drinking 8 bottles of wine daily. He does report abdominal pain with tenderness to light palpation. He states his last alcoholic drink was on Saturday. He has been visiting family in [place]. He does report nausea and vomiting    Lipase was in the 600 range      Hospital Course:   Patient was admitted started on IV fluids with thiamine and folic acid    He was started on CIWA alcohol withdrawal protocol    Condition steadily improved over the next several days.    He did not develop any severe withdrawal symptoms    Lipase normalized and he was feeling better by 3/2, at which point he was felt stable for discharge home              Discharge Medications:   Current Discharge Medication List        CONTINUE these medications which have NOT CHANGED    Details   lisinopriL (PRINIVIL, ZESTRIL) 40 mg tablet Take 40 mg by mouth.      amLODIPine (NORVASC) 10 mg tablet Take 1 Tablet by mouth daily.  Qty: 30 Tablet, Refills: 3      metFORMIN ER (GLUCOPHAGE XR) 500 mg tablet Take 500 mg by mouth daily.      sucralfate (CARAFATE) 1 gram tablet Take 1 Tablet by mouth Before breakfast, lunch, dinner and at bedtime.  Qty: 21 Tablet, Refills: 1      insulin regular (NOVOLIN R,  HUMULIN R) 100 unit/mL injection by SubCUTAneous route. On a scale insulin      omeprazole (PRILOSEC) 40 mg capsule Take 40 mg by mouth daily.      insulin nph-regular human rec (NovoLIN 70-30 FlexPen U-100) 100 unit/mL (70-30) inpn Inject 48 units qAM, 28 units qhs  Qty: 2 Each, Refills: 0           STOP taking these medications       aspirin delayed-release 81 mg tablet Comments:   Reason for Stopping:         escitalopram oxalate (LEXAPRO) 20 mg tablet Comments:   Reason for Stopping:         fenofibrate nanocrystallized (TRICOR) 145 mg tablet Comments:   Reason for Stopping:         pregabalin (LYRICA) 50 mg capsule Comments:   Reason for Stopping:         chlordiazePOXIDE (LIBRIUM) 10 mg capsule Comments:   Reason for Stopping:                 Follow up Care:    1. Other, Phys, MD in 1-2 weeks.  Please call to set up an appointment shortly after discharge.  Diet:  Diabetic Diet    Disposition:  Home.    Advanced Directive:   FULL    DNR      Discharge Exam:  General:  Alert, cooperative, no distress, appears stated age.   Lungs:   Clear to auscultation bilaterally.   Chest wall:  No tenderness or deformity.   Heart:  Regular rate and rhythm, S1, S2 normal, no murmur, click, rub or gallop.   Abdomen:   Soft, non-tender. Bowel sounds normal. No masses,  No organomegaly.   Extremities: Extremities normal, atraumatic, no cyanosis or edema.   Pulses: 2+ and symmetric all extremities.   Skin: Skin color, texture, turgor normal. No rashes or lesions   Neurologic: CNII-XII intact. No gross sensory or motor deficits        CONSULTATIONS: None    Significant Diagnostic Studies:   04/24/2021: BUN 7 mg/dL (Ref range: 6 - 20 mg/dL); Calcium 8.3 mg/dL (L; Ref range: 8.5 - 77.8 mg/dL); CO2 30 mmol/L (Ref range: 21 - 32 mmol/L); Creatinine 1.30 mg/dL (Ref range: 2.42 - 3.53 mg/dL); Glucose 491 mg/dL (H; Ref range: 65 - 614 mg/dL); HCT 43.1 % (L; Ref range: 36.6 - 50.3 %); HGB 11.8 g/dL (L; Ref range: 54.0 - 08.6 g/dL);  Potassium 2.4 mmol/L (LL; Ref range: 3.5 - 5.1 mmol/L); Sodium 127 mmol/L (L; Ref range: 136 - 145 mmol/L)  04/25/2021: BUN 6 mg/dL (Ref range: 6 - 20 mg/dL); Calcium 7.8 mg/dL (L; Ref range: 8.5 - 76.1 mg/dL); CO2 33 mmol/L (H; Ref range: 21 - 32 mmol/L); Creatinine 0.92 mg/dL (Ref range: 9.50 - 9.32 mg/dL); Glucose 86 mg/dL (Ref range: 65 - 671 mg/dL); HCT 24.5 % (L; Ref range: 36.6 - 50.3 %); HGB 10.8 g/dL (L; Ref range: 80.9 - 98.3 g/dL); Potassium 2.5 mmol/L (LL; Ref range: 3.5 - 5.1 mmol/L); Sodium 133 mmol/L (L; Ref range: 136 - 145 mmol/L)  Recent Labs     04/26/21  0742 04/25/21  0822   WBC 5.9 9.7   HGB 10.8* 10.8*   HCT 33.3* 32.1*   PLT 305 281     Recent Labs     04/27/21  0635 04/26/21  0742 04/25/21  0822   NA 137 136 133*   K 4.2 3.0* 2.5*   CL 106 103 94*   CO2 27 28 33*   BUN 2* 5* 6   CREA 0.83 0.86 0.92   GLU 58* 59* 86   CA 8.6 8.2* 7.8*   MG 1.8  --  1.7   PHOS 1.6*  --   --      Recent Labs     04/27/21  0635 04/26/21  0742 04/25/21  0822   ALT  --  33 35   AP  --  110 133*   TBILI  --  0.4 0.5   TP  --  5.8* 5.7*   ALB 2.5* 2.3* 2.5*   GLOB  --  3.5 3.2   LPSE 309  --  689*     No results for input(s): INR, PTP, APTT, INREXT in the last 72 hours.   No results for input(s): FE, TIBC, PSAT, FERR in the last 72 hours.   No results for input(s): PH, PCO2, PO2 in the last 72 hours.  No results for input(s): CPK, CKMB in the last 72 hours.    No lab exists for component: TROPONINI  Lab Results   Component Value Date/Time    Glucose (POC) 162 (H) 04/27/2021 09:06  AM    Glucose (POC) 58 (L) 04/27/2021 08:02 AM    Glucose (POC) 222 (H) 04/26/2021 07:50 PM    Glucose (POC) 117 (H) 04/26/2021 04:11 PM    Glucose (POC) 125 (H) 04/26/2021 11:32 AM       Discharge time spent 35 minutes    Signed:  Lieutenant Diego, MD  04/27/2021  10:20 AM

## 2021-04-27 NOTE — Progress Notes (Signed)
 0825: Chart reviewed.    When medically stable, current dispo is to discharge home no needs.     CM will continue to follow patient and recs of medical team.    1125: Discharge summary/order home no needs noted.    When transportation available, patient to discharge home no needs.    Discharge plan of care/case management plan validated with provider discharge order.     Discharge Checklist Completed.

## 2021-04-27 NOTE — Progress Notes (Signed)
No appointments made because no PCP in system

## 2021-04-27 NOTE — Progress Notes (Signed)
I have reviewed discharge instructions with the patient.  The patient verbalized understanding.  Peripheral IV removed upon discharge.

## 2021-07-07 ENCOUNTER — Encounter: Attending: "Endocrinology | Primary: Family Medicine

## 2021-07-07 ENCOUNTER — Encounter: Attending: "Endocrinology

## 2021-07-29 ENCOUNTER — Inpatient Hospital Stay
Admit: 2021-07-29 | Discharge: 2021-07-29 | Disposition: A | Discharge status: Home / Self Care | Payer: MEDICAID | Attending: Emergency Medicine

## 2021-07-29 DIAGNOSIS — M79604 Pain in right leg: Secondary | ICD-10-CM

## 2021-07-29 MED ORDER — CYCLOBENZAPRINE HCL 10 MG PO TABS
10 MG | ORAL_TABLET | Freq: Three times a day (TID) | ORAL | 0 refills | Status: AC | PRN
Start: 2021-07-29 — End: 2021-08-08

## 2021-07-29 MED ORDER — DIAZEPAM 2 MG PO TABS
2 MG | Freq: Once | ORAL | Status: DC
Start: 2021-07-29 — End: 2021-07-29

## 2021-07-29 MED ORDER — DIAZEPAM 5 MG PO TABS
5 MG | Freq: Once | ORAL | Status: AC
Start: 2021-07-29 — End: 2021-07-29
  Administered 2021-07-29: 16:00:00 2.5 mg via ORAL

## 2021-07-29 MED ORDER — HYDROCODONE-ACETAMINOPHEN 5-325 MG PO TABS
5-325 MG | ORAL | Status: AC
Start: 2021-07-29 — End: 2021-07-29
  Administered 2021-07-29: 16:00:00 1 via ORAL

## 2021-07-29 MED FILL — DIAZEPAM 5 MG PO TABS: 5 MG | ORAL | Qty: 1

## 2021-07-29 MED FILL — HYDROCODONE-ACETAMINOPHEN 5-325 MG PO TABS: 5-325 MG | ORAL | Qty: 1

## 2021-07-29 NOTE — ED Provider Notes (Signed)
[facility] EMERGENCY DEPARTMENT  EMERGENCY DEPARTMENT HISTORY AND PHYSICAL EXAM      Date: 07/29/2021  Patient Name: Antonio TORTI Sr.  MRN: [id number]  Birthdate: 14-Jun-1960  Date of evaluation: 07/29/2021  Provider: Marquette Old, MD   Note Started: 11:53 AM EDT 07/29/21    HISTORY OF PRESENT ILLNESS     Chief Complaint   Patient presents with    Leg Pain       History Provided By: Patient    HPI: Antonio SCHIPANI Sr. is a 61 y.o. male with  past medical history of DM, HTN, ETOH abuse presents with a shock like pain in his right outer thigh. It is unprovoked. Pt states episodes come on for sec (s), No specific triggers. He denies back pain or injury. He does where belts.    PAST MEDICAL HISTORY   Past Medical History:  Past Medical History:   Diagnosis Date    Depression     Diabetes (HCC)     ETOH abuse     High cholesterol     Hypertension        Past Surgical History:  Past Surgical History:   Procedure Laterality Date    CHOLECYSTECTOMY         Family History:  Family History   Problem Relation Age of Onset    Hypertension Mother        Social History:  Social History     Tobacco Use    Smoking status: Never    Smokeless tobacco: Never   Substance Use Topics    Alcohol use: Yes     Alcohol/week: 74.0 standard drinks    Drug use: Not Currently       Allergies:  No Known Allergies    PCP: None None    Current Meds:   No current facility-administered medications for this encounter.     Current Outpatient Medications   Medication Sig Dispense Refill    cyclobenzaprine (FLEXERIL) 10 MG tablet Take 1 tablet by mouth 3 times daily as needed for Muscle spasms 21 tablet 0    amLODIPine (NORVASC) 10 MG tablet Take 1 tablet by mouth daily      Insulin NPH Isophane & Regular (NOVOLIN 70/30 FLEXPEN) (70-30) 100 UNIT per ML injection pen Inject 48 units qAM, 28 units qhs      insulin regular (HUMULIN R;NOVOLIN R) 100 UNIT/ML injection Inject into the skin      lisinopril (PRINIVIL;ZESTRIL) 40 MG tablet Take 1 tablet by mouth       metFORMIN (GLUCOPHAGE-XR) 500 MG extended release tablet Take 1 tablet by mouth daily      omeprazole (PRILOSEC) 40 MG delayed release capsule Take 1 capsule by mouth daily      sucralfate (CARAFATE) 1 GM tablet Take 1 tablet by mouth 4 times daily (before meals and nightly)         Social Determinants of Health:   Social Determinants of Health     Tobacco Use: Low Risk     Smoking Tobacco Use: Never    Smokeless Tobacco Use: Never    Passive Exposure: Not on file   Alcohol Use: Not on file   Financial Resource Strain: Not on file   Food Insecurity: Not on file   Transportation Needs: Not on file   Physical Activity: Not on file   Stress: Not on file   Social Connections: Not on file   Intimate Partner Violence: Not on file   Depression: Not on  file   Housing Stability: Not on file       PHYSICAL EXAM   Physical Exam  Vitals and nursing note reviewed.   Constitutional:       General: He is not in acute distress.     Appearance: Normal appearance.      Comments: Occ shooting pain causes pt to grab right leg then fine in between episodes which are sporadic   Cardiovascular:      Rate and Rhythm: Normal rate.   Pulmonary:      Effort: Pulmonary effort is normal.   Musculoskeletal:         General: No tenderness. Normal range of motion.      Comments: 5/5 LE strength  Muscle testing did not provoke episodes of pain  No calf swelling or ttp   Skin:     General: Skin is warm.      Capillary Refill: Capillary refill takes less than 2 seconds.   Neurological:      Mental Status: He is alert.       SCREENINGS              LAB, EKG AND DIAGNOSTIC RESULTS   Labs:  No results found for this or any previous visit (from the past 12 hour(s)).    EKG: Initial EKG interpreted by me.Not Applicable    Radiologic Studies:  Non-plain film images such as CT, Ultrasound and MRI are read by the radiologist. Plain radiographic images are visualized and preliminarily interpreted by the ED Physician with the following findings: Not  Applicable.    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 Will provide pt with muscle relaxant and pain medication here but will consider increasing lyrica over weekend.     Progress note: Pt taking 50 mg BID Lyrica. Will Increase to 50 mg TID thru weekend and have pt call PCP Monday to discuss his symptoms and repeat    Records Reviewed (source and summary of external notes): Prior medical records and Nursing notes    Vitals:    Vitals:    07/29/21 1104   BP: (!) 153/91   Pulse: 92   Resp: 18   Temp: 97.6 F (36.4 C)   SpO2: 100%   Weight: 87.1 kg (192 lb)   Height: 1.753 m (5\' 9" )        ED COURSE       Disposition Considerations (Tests not done, Shared Decision Making, Pt Expectation of Test or Treatment.): Not Applicable    Patient was given the following medications:  Medications   HYDROcodone-acetaminophen (NORCO) 5-325 MG per tablet 1 tablet (1 tablet Oral Given 07/29/21 1147)   diazePAM (VALIUM) tablet 2.5 mg (2.5 mg Oral Given 07/29/21 1147)       CONSULTS: (Who and What was discussed)  None     Social Determinants affecting Dx or Tx: None    Smoking Cessation: Not Applicable    PROCEDURES   Unless otherwise noted above, none.  Procedures      CRITICAL CARE TIME   Patient does not meet Critical Care Time, 0 minutes    FINAL IMPRESSION     1. Right leg pain    2. Neuropathic pain, leg, right  DISPOSITION/PLAN   DISPOSITION Decision To Discharge 07/29/2021 12:00:40 PM    Discharge Note: The patient is stable for discharge home. The signs, symptoms, diagnosis, and discharge instructions have been discussed, understanding conveyed, and agreed upon. The patient is to follow up as recommended or return to ER should their symptoms worsen.      PATIENT REFERRED  TO:    INCREASE LYRICA TO 50 MG THREE TIMES A DAY THIS WEEKEND AND DISCUSS WITH PCP MONDAY.        [facility] EMERGENCY DEPARTMENT  [address]  [place] [address]  [phone]    If symptoms worsen        DISCHARGE MEDICATIONS:     Medication List        START taking these medications      cyclobenzaprine 10 MG tablet  Commonly known as: FLEXERIL  Take 1 tablet by mouth 3 times daily as needed for Muscle spasms            ASK your doctor about these medications      amLODIPine 10 MG tablet  Commonly known as: NORVASC     insulin regular 100 UNIT/ML injection  Commonly known as: HUMULIN R;NOVOLIN R     lisinopril 40 MG tablet  Commonly known as: PRINIVIL;ZESTRIL     metFORMIN 500 MG extended release tablet  Commonly known as: GLUCOPHAGE-XR     NovoLIN 70/30 FlexPen (70-30) 100 UNIT per ML injection pen  Generic drug: Insulin NPH Isophane & Regular     omeprazole 40 MG delayed release capsule  Commonly known as: PRILOSEC     sucralfate 1 GM tablet  Commonly known as: CARAFATE               Where to Get Your Medications        These medications were sent to [facility] [phone] [place], [place] - [address] - P [phone] Carmon Ginsberg [phone]  [address] [place], [place] [address]      Phone: [phone]   cyclobenzaprine 10 MG tablet           DISCONTINUED MEDICATIONS:  Current Discharge Medication List          I am the Primary Clinician of Record: Marquette Old, MD (electronically signed)    (Please note that parts of this dictation were completed with voice recognition software. Quite often unanticipated grammatical, syntax, homophones, and other interpretive errors are inadvertently transcribed by the computer software. Please disregards these errors. Please excuse any errors that have escaped final proofreading.)     Marquette Old, MD  07/29/21 1209

## 2021-07-29 NOTE — Discharge Instructions (Addendum)
....................................       Thank you!  Thank you for allowing me to care for you in the emergency department. It is my goal to provide you with excellent care. If you have not received excellent quality care, please ask to speak to the nurse manager. Please fill out the survey that will come to you by mail or email since we listen to your feedback!     Below you will find a list of your tests from today's visit.  Should you have any questions, please do not hesitate to call the emergency department.    Labs  No results found for this or any previous visit (from the past 12 hour(s)).    Radiologic Studies  No orders to display     ------------------------------------------------------------------------------------------------------------  The exam and treatment you received in the Emergency Department were for an urgent problem and are not intended as complete care. It is important that you follow-up with a doctor, nurse practitioner, or physician assistant to:  (1) confirm your diagnosis,  (2) re-evaluation of changes in your illness and treatment, and  (3) for ongoing care. Please take your discharge instructions with you when you go to your follow-up appointment.     If you have any problem arranging a follow-up appointment, contact the Emergency Department.  If your symptoms become worse or you do not improve as expected and you are unable to reach your health care provider, please return to the Emergency Department. We are available 24 hours a day.     If a prescription has been provided, please have it filled as soon as possible to prevent a delay in treatment. If you have any questions or reservations about taking the medication due to side effects or interactions with other medications, please call your primary care provider or contact the ER.

## 2021-07-29 NOTE — ED Triage Notes (Signed)
Right thigh pain onset this am, hx of neuropathy .denies injury

## 2021-09-02 ENCOUNTER — Inpatient Hospital Stay
Admit: 2021-09-02 | Discharge: 2021-09-02 | Disposition: A | Discharge status: Home / Self Care | Payer: BLUE CROSS/BLUE SHIELD | Attending: Emergency Medicine

## 2021-09-02 ENCOUNTER — Emergency Department: Admit: 2021-09-02 | Payer: BLUE CROSS/BLUE SHIELD

## 2021-09-02 DIAGNOSIS — F10120 Alcohol abuse with intoxication, uncomplicated: Secondary | ICD-10-CM

## 2021-09-02 DIAGNOSIS — R112 Nausea with vomiting, unspecified: Secondary | ICD-10-CM

## 2021-09-02 LAB — CBC WITH AUTO DIFFERENTIAL
Absolute Immature Granulocyte: 0 10*3/uL (ref 0.00–0.04)
Basophils %: 0 % (ref 0–1)
Basophils Absolute: 0 10*3/uL (ref 0.0–0.1)
Eosinophils %: 0 % (ref 0–7)
Eosinophils Absolute: 0 10*3/uL (ref 0.0–0.4)
Hematocrit: 36.4 % — ABNORMAL LOW (ref 36.6–50.3)
Hemoglobin: 12.1 g/dL (ref 12.1–17.0)
Immature Granulocytes: 0 % (ref 0.0–0.5)
Lymphocytes %: 38 % (ref 12–49)
Lymphocytes Absolute: 3.4 10*3/uL (ref 0.8–3.5)
MCH: 26.9 PG (ref 26.0–34.0)
MCHC: 33.2 g/dL (ref 30.0–36.5)
MCV: 80.9 FL (ref 80.0–99.0)
MPV: 9.4 FL (ref 8.9–12.9)
Monocytes %: 7 % (ref 5–13)
Monocytes Absolute: 0.6 10*3/uL (ref 0.0–1.0)
Neutrophils %: 55 % (ref 32–75)
Neutrophils Absolute: 5 10*3/uL (ref 1.8–8.0)
Platelets: 412 10*3/uL — ABNORMAL HIGH (ref 150–400)
RBC: 4.5 M/uL (ref 4.10–5.70)
RDW: 16.9 % — ABNORMAL HIGH (ref 11.5–14.5)
WBC: 9 10*3/uL (ref 4.1–11.1)

## 2021-09-02 LAB — BASIC METABOLIC PANEL
Anion Gap: 16 mmol/L — ABNORMAL HIGH (ref 5–15)
BUN: 10 mg/dL (ref 6–20)
Bun/Cre Ratio: 6 — ABNORMAL LOW (ref 12–20)
CO2: 23 mmol/L (ref 21–32)
Calcium: 9.4 mg/dL (ref 8.5–10.1)
Chloride: 94 mmol/L — ABNORMAL LOW (ref 97–108)
Creatinine: 1.57 mg/dL — ABNORMAL HIGH (ref 0.70–1.30)
Est, Glom Filt Rate: 50 mL/min/{1.73_m2} — ABNORMAL LOW (ref >60)
Glucose: 88 mg/dL (ref 65–100)
Potassium: 3.1 mmol/L — ABNORMAL LOW (ref 3.5–5.1)
Sodium: 133 mmol/L — ABNORMAL LOW (ref 136–145)

## 2021-09-02 LAB — HEPATIC FUNCTION PANEL
ALT: 75 U/L (ref 12–78)
AST: 64 U/L — ABNORMAL HIGH (ref 15–37)
Albumin/Globulin Ratio: 0.9 — ABNORMAL LOW (ref 1.1–2.2)
Albumin: 3.6 g/dL (ref 3.5–5.0)
Alk Phosphatase: 98 U/L (ref 45–117)
Bilirubin, Direct: 0.1 mg/dL (ref 0.0–0.2)
Globulin: 4 g/dL (ref 2.0–4.0)
Total Bilirubin: 0.4 mg/dL (ref 0.2–1.0)
Total Protein: 7.6 g/dL (ref 6.4–8.2)

## 2021-09-02 LAB — TROPONIN
Troponin, High Sensitivity: 8 ng/L (ref 0–76)
Troponin, High Sensitivity: 7 ng/L (ref 0–76)

## 2021-09-02 LAB — LIPASE: Lipase: 303 U/L (ref 73–393)

## 2021-09-02 LAB — ETHANOL: Ethanol Lvl: 100 mg/dL — ABNORMAL HIGH (ref <10)

## 2021-09-02 MED ORDER — THIAMINE HCL 100 MG/ML IJ SOLN
100 | Freq: Once | INTRAMUSCULAR | Status: AC
Start: 2021-09-02 — End: 2021-09-02
  Administered 2021-09-02: 09:00:00 100 mg via INTRAVENOUS

## 2021-09-02 MED ORDER — POTASSIUM CHLORIDE 10 MEQ/100ML IV SOLN
10 MEQ/0ML | Freq: Once | INTRAVENOUS | Status: AC
Start: 2021-09-02 — End: 2021-09-02
  Administered 2021-09-02: 09:00:00 10 meq via INTRAVENOUS

## 2021-09-02 MED ORDER — FAMOTIDINE (PF) 20 MG/2ML IV SOLN
20 MG/2ML | Freq: Once | INTRAVENOUS | Status: AC
Start: 2021-09-02 — End: 2021-09-02
  Administered 2021-09-02: 10:00:00 20 mg via INTRAVENOUS

## 2021-09-02 MED ORDER — SODIUM CHLORIDE 0.9 % IV BOLUS
0.9 % | INTRAVENOUS | Status: AC
Start: 2021-09-02 — End: 2021-09-02
  Administered 2021-09-02: 08:00:00 1000 mL via INTRAVENOUS

## 2021-09-02 MED ORDER — THIAMINE HCL 100 MG/ML IJ SOLN
100 | INTRAMUSCULAR | Status: DC
Start: 2021-09-02 — End: 2021-09-02

## 2021-09-02 MED ORDER — ONDANSETRON HCL 4 MG/2ML IJ SOLN
4 MG/2ML | Freq: Once | INTRAMUSCULAR | Status: AC
Start: 2021-09-02 — End: 2021-09-02
  Administered 2021-09-02: 08:00:00 4 mg via INTRAVENOUS

## 2021-09-02 MED ORDER — ONDANSETRON 4 MG PO TBDP
4 MG | ORAL_TABLET | Freq: Three times a day (TID) | ORAL | 0 refills | Status: AC | PRN
Start: 2021-09-02 — End: ?

## 2021-09-02 MED ORDER — POTASSIUM CHLORIDE CRYS ER 20 MEQ PO TBCR
20 MEQ | Freq: Once | ORAL | Status: AC
Start: 2021-09-02 — End: 2021-09-02
  Administered 2021-09-02: 09:00:00 40 meq via ORAL

## 2021-09-02 MED FILL — FAMOTIDINE (PF) 20 MG/2ML IV SOLN: 20 MG/2ML | INTRAVENOUS | Qty: 2

## 2021-09-02 MED FILL — SODIUM CHLORIDE 0.9 % IV SOLN: 0.9 % | INTRAVENOUS | Qty: 1000

## 2021-09-02 MED FILL — POTASSIUM CHLORIDE CRYS ER 20 MEQ PO TBCR: 20 MEQ | ORAL | Qty: 2

## 2021-09-02 MED FILL — THIAMINE HCL 100 MG/ML IJ SOLN: 100 MG/ML | INTRAMUSCULAR | Qty: 2

## 2021-09-02 MED FILL — ONDANSETRON HCL 4 MG/2ML IJ SOLN: 4 MG/2ML | INTRAMUSCULAR | Qty: 2

## 2021-09-02 MED FILL — POTASSIUM CHLORIDE 10 MEQ/100ML IV SOLN: 10 MEQ/0ML | INTRAVENOUS | Qty: 100

## 2021-09-02 NOTE — ED Triage Notes (Signed)
Pt states he drank too much and feels "bad." States that he drank 5, 24oz cans of beer.

## 2021-09-02 NOTE — ED Provider Notes (Signed)
EMERGENCY DEPARTMENT HISTORY AND PHYSICAL EXAM    Date: 09/02/2021  Patient Name: Antonio Antonio Sr.    History of Presenting Illness     Chief Complaint   Patient presents with    Nausea    Emesis    Alcohol Intoxication       History Provided By: Patient wife    HPI: Antonio Bast Sr., 61 y.o. male   presents to the ED with cc of nausea and vomiting.  Patient with history insulin-dependent diabetes and longstanding history of EtOH abuse complains of nausea with multiple episodes of vomiting and "feeling bad".  Patient states "I think I drink too much".  No abdominal pain.  No signs of GI bleeding.  No chest pain or shortness of breath.  Patient complains of moderate generalized headache since yesterday without history of head injury.  No photophobia.  No visual changes, slurred speech, or motor dysfunction.  No seizure activity.      PCP: None None    No current facility-administered medications on file prior to encounter.     Current Outpatient Medications on File Prior to Encounter   Medication Sig Dispense Refill    amLODIPine (NORVASC) 10 MG tablet Take 1 tablet by mouth daily      Insulin NPH Isophane & Regular (NOVOLIN 70/30 FLEXPEN) (70-30) 100 UNIT per ML injection pen Inject 48 units qAM, 28 units qhs      insulin regular (HUMULIN R;NOVOLIN R) 100 UNIT/ML injection Inject into the skin      lisinopril (PRINIVIL;ZESTRIL) 40 MG tablet Take 1 tablet by mouth      metFORMIN (GLUCOPHAGE-XR) 500 MG extended release tablet Take 1 tablet by mouth daily      omeprazole (PRILOSEC) 40 MG delayed release capsule Take 1 capsule by mouth daily      sucralfate (CARAFATE) 1 GM tablet Take 1 tablet by mouth 4 times daily (before meals and nightly)         Past History     Past Medical History:  Past Medical History:   Diagnosis Date    Depression     Diabetes ([place])     ETOH abuse     High cholesterol     Hypertension        Past Surgical History:  Past Surgical History:   Procedure Laterality Date    CHOLECYSTECTOMY          Family History:  Family History   Problem Relation Age of Onset    Hypertension Mother        Social History:  Social History     Tobacco Use    Smoking status: Never    Smokeless tobacco: Never   Substance Use Topics    Alcohol use: Yes     Alcohol/week: 74.0 standard drinks     Types: 74 Standard drinks or equivalent per week    Drug use: Not Currently       Allergies:  No Known Allergies      Review of Systems       Physical Exam   Physical Exam  Vitals and nursing note reviewed.   Constitutional:       General: He is not in acute distress.     Appearance: Normal appearance. He is not ill-appearing, toxic-appearing or diaphoretic.   HENT:      Head: Normocephalic and atraumatic.      Nose: Nose normal.      Mouth/Throat:      Pharynx:  No oropharyngeal exudate or posterior oropharyngeal erythema.   Eyes:      General: No scleral icterus.     Extraocular Movements: Extraocular movements intact.      Conjunctiva/sclera: Conjunctivae normal.      Pupils: Pupils are equal, round, and reactive to light.   Cardiovascular:      Rate and Rhythm: Normal rate and regular rhythm.      Heart sounds: Normal heart sounds.   Pulmonary:      Effort: Pulmonary effort is normal. No respiratory distress.      Breath sounds: Normal breath sounds. No wheezing or rales.   Abdominal:      Palpations: Abdomen is soft.      Tenderness: There is no abdominal tenderness. There is no guarding or rebound.   Musculoskeletal:      Cervical back: Neck supple.      Right lower leg: No edema.      Left lower leg: No edema.   Skin:     General: Skin is warm and dry.   Neurological:      General: No focal deficit present.      Mental Status: He is alert.      Cranial Nerves: No cranial nerve deficit.      Motor: No weakness.   Psychiatric:         Behavior: Behavior normal.       Diagnostic Study Results     Labs -     Recent Results (from the past 12 hour(s))   Basic Metabolic Panel    Collection Time: 09/02/21  4:15 AM   Result Value Ref  Range    Sodium 133 (L) 136 - 145 mmol/L    Potassium 3.1 (L) 3.5 - 5.1 mmol/L    Chloride 94 (L) 97 - 108 mmol/L    CO2 23 21 - 32 mmol/L    Anion Gap 16 (H) 5 - 15 mmol/L    Glucose 88 65 - 100 mg/dL    BUN 10 6 - 20 mg/dL    Creatinine 1.57 (H) 0.70 - 1.30 mg/dL    Bun/Cre Ratio 6 (L) 12 - 20      Est, Glom Filt Rate 50 (L) >60 ml/min/1.31m    Calcium 9.4 8.5 - 10.1 mg/dL   CBC with Auto Differential    Collection Time: 09/02/21  4:15 AM   Result Value Ref Range    WBC 9.0 4.1 - 11.1 K/uL    RBC 4.50 4.10 - 5.70 M/uL    Hemoglobin 12.1 12.1 - 17.0 g/dL    Hematocrit 36.4 (L) 36.6 - 50.3 %    MCV 80.9 80.0 - 99.0 FL    MCH 26.9 26.0 - 34.0 PG    MCHC 33.2 30.0 - 36.5 g/dL    RDW 16.9 (H) 11.5 - 14.5 %    Platelets 412 (H) 150 - 400 K/uL    MPV 9.4 8.9 - 12.9 FL    Neutrophils % 55 32 - 75 %    Lymphocytes % 38 12 - 49 %    Monocytes % 7 5 - 13 %    Eosinophils % 0 0 - 7 %    Basophils % 0 0 - 1 %    Immature Granulocytes 0 0.0 - 0.5 %    Neutrophils Absolute 5.0 1.8 - 8.0 K/UL    Lymphocytes Absolute 3.4 0.8 - 3.5 K/UL    Monocytes Absolute 0.6 0.0 - 1.0 K/UL  Eosinophils Absolute 0.0 0.0 - 0.4 K/UL    Basophils Absolute 0.0 0.0 - 0.1 K/UL    Absolute Immature Granulocyte 0.0 0.00 - 0.04 K/UL    Differential Type AUTOMATED     Ethanol    Collection Time: 09/02/21  4:15 AM   Result Value Ref Range    Ethanol Lvl 100 (H) <10 mg/dL   Hepatic Function Panel    Collection Time: 09/02/21  4:15 AM   Result Value Ref Range    Total Protein 7.6 6.4 - 8.2 g/dL    Albumin 3.6 3.5 - 5.0 g/dL    Globulin 4.0 2.0 - 4.0 g/dL    Albumin/Globulin Ratio 0.9 (L) 1.1 - 2.2      Total Bilirubin 0.4 0.2 - 1.0 mg/dL    Bilirubin, Direct 0.1 0.0 - 0.2 mg/dL    Alk Phosphatase 98 45 - 117 U/L    AST 64 (H) 15 - 37 U/L    ALT 75 12 - 78 U/L   Troponin    Collection Time: 09/02/21  4:15 AM   Result Value Ref Range    Troponin, High Sensitivity 8 0 - 76 ng/L   Lipase    Collection Time: 09/02/21  4:15 AM   Result Value Ref Range    Lipase  303 73 - 393 U/L   Troponin    Collection Time: 09/02/21  5:45 AM   Result Value Ref Range    Troponin, High Sensitivity 7 0 - 76 ng/L       Radiologic Studies -   CT HEAD WO CONTRAST   Final Result   No acute intracranial process.              _0 @  _1 @  EKG sinus tachycardia 127 normal QRS QT nonspecific ST-T wave changes normal axis no STEMI    Medical Decision Making   I am the first provider for this patient.  I reviewed the vital signs, available nursing notes, past medical history, past surgical history, family history and social history.    Patient with longstanding history of insulin-dependent diabetes and alcohol abuse presents emergency room with nausea and vomiting and signs of intoxication.  On exam patient is lethargic but easily arousable and oriented x4.  Neurologically nonfocal.  Exam was unremarkable including soft and nontender abdomen without guarding or rebound.  EKG shows sinus tachycardia without ischemic changes.  Differential diagnoses are considered include alcohol intoxication, pancreatitis, alcohol induced hepatitis, dehydration, electrolyte abnormality, MI, or intracranial bleed.  Patient was given IV hydration along with IV Zofran and IV Pepcid for his alcohol induced vomiting.  Labs were unremarkable except for elevated EtOH level and hyperkalemia.  No clinical or laboratory finding suggestive of DKA.  2 set of troponin were negative.  CT of was negative for bleed.  No clinical concern for meningitis, sepsis, or alcohol withdrawal.  Patient was discharge improved and stable condition with Zofran ODT as needed, adequate hydration recommended, return cautions given.  I advised the patient and family regarding withholding the use of insulin until he is able to have an adequate p.o. intake.  Patient and wife are comfortable with discharge plan.    Vital Signs-Reviewed the patient's vital signs.  _2 @    Records Reviewed:  Available old ER/admission charts reviewed    ED Course:    Initial assessment performed. The patients presenting problems have been discussed, and they are in agreement with the care plan formulated and outlined with them.  I have  encouraged them to ask questions as they arise throughout their visit.     Patient ambulated well without difficulty.  Tolerated p.o. without vomiting.      PROCEDURES      Disposition: Condition stable   DC- Adult Discharges: All of the diagnostic tests were reviewed and questions answered. Diagnosis, care plan and treatment options were discussed.  understand instructions and will follow up as directed. The patients results have been reviewed with them.  They have been counseled regarding their diagnosis.  The patient verbally convey understanding and agreement of the signs, symptoms, diagnosis, treatment and prognosis and additionally agrees to follow up as recommended.  They also agree with the care-plan and convey that all of their questions have been answered.  I have also put together some discharge instructions for them that include: 1) educational information regarding their diagnosis, 2) how to care for their diagnosis at home, as well a 3) list of reasons why they would want to return to the ED prior to their follow-up appointment, should their condition change.  PLAN:  1.      Medication List        START taking these medications      ondansetron 4 MG disintegrating tablet  Commonly known as: ZOFRAN-ODT  Take 1 tablet by mouth 3 times daily as needed for Nausea or Vomiting            ASK your doctor about these medications      amLODIPine 10 MG tablet  Commonly known as: NORVASC     insulin regular 100 UNIT/ML injection  Commonly known as: HUMULIN R;NOVOLIN R     lisinopril 40 MG tablet  Commonly known as: PRINIVIL;ZESTRIL     metFORMIN 500 MG extended release tablet  Commonly known as: GLUCOPHAGE-XR     NovoLIN 70/30 FlexPen (70-30) 100 UNIT per ML injection pen  Generic drug: Insulin NPH Isophane & Regular     omeprazole 40 MG  delayed release capsule  Commonly known as: PRILOSEC     sucralfate 1 GM tablet  Commonly known as: CARAFATE               Where to Get Your Medications        These medications were sent to [id number] [id number] [place] [address]     Phone: [phone]  ondansetron 4 MG disintegrating tablet       2. _0 @  Return to ED if worse     Diagnosis     Clinical Impression:   1. Nausea and vomiting, unspecified vomiting type    2. Acute alcoholic intoxication without complication ([place]    3. Hypokalemia    4. Nonintractable headache, unspecified chronicity pattern, unspecified headache type        Please note that this dictation was completed with Dragon, the computer voice recognition software.  Quite often unanticipated grammatical, syntax, homophones, and other interpretive errors are inadvertently transcribed by the computer software.  Please disregard these errors.  Please excuse any errors that have escaped final proofreading.  Thank you.      SLevada Schilling MD  09/02/21 [phone]

## 2021-09-03 LAB — EKG 12-LEAD
Atrial Rate: 127 {beats}/min
P Axis: 65 degrees
P-R Interval: 144 ms
Q-T Interval: 308 ms
QRS Duration: 72 ms
QTc Calculation (Bazett): 447 ms
R Axis: 36 degrees
T Axis: 59 degrees
Ventricular Rate: 127 {beats}/min

## 2021-10-03 NOTE — Telephone Encounter (Signed)
Formatting of this note is different from the original.  Recent Office Visits       Date Provider Department Visit Type Primary Dx    03/09/2021 Jordan Hawks, MD [facility] Office Visit Other iron deficiency anemia    02/02/2021 Shroff, Clancy Gourd, MD [facility] Office Visit Type 2 diabetes mellitus with diabetic neuropathy, with long-term current use of insulin (CMS-HCC)    07/14/2020 Shroff, Clancy Gourd, MD [facility] Office Visit Type 2 diabetes mellitus with complications (CMS-HCC)     Future Appointments    This patient does not currently have any appointments scheduled.      Electronically signed by Ranae Pila, CMA at 10/03/2021  4:00 PM EDT

## 2021-12-01 NOTE — Progress Notes (Signed)
Formatting of this note is different from the original.  Pre Spine Procedure Flow Sheet    Patient:  Antonio Melendez  MRN: [id number]  Date: 12/01/21    PRE-PROCEDURE  Injection-specific allergies: none  Recent Infections: No  Influenza Vaccine:  No  Covid Vaccine:  Yes  Recent Dental procedures:  No  History of bleeding disorder or low platelets: No  Taking blood thinners: Yes Aspirin  Last Dose on: 11/29/2021    PT/INR:  Diabetic: Yes    Glucose Blood, POC   Date Value Ref Range Status   12/01/2021 182  Final       Vitals:    12/01/21 0955   BP: (!) 141/79   Pulse: (!) 105   Resp: 16   Temp: 97.9 F   SpO2: 96%     Pain Assessment:  Scale R/L/Bilateral: Location:   8  Left back     RFA (radiofrequency ablation): No    Sedation prior to arrival:No  NPO:  Yes, midnight  IV Required: No    Responsible adult with patient: Name: Alyce Pagan , Phone number: WR    Pre-op Comments:  Pre-Op Prepared by: M.Rowe RN  Electronically signed by Marcos Eke, RN at 12/01/2021 10:02 AM EDT

## 2021-12-01 NOTE — Progress Notes (Signed)
Associated Order(s): Spine Injection: L L4-L5, L L5-S1  Pre-Procedure Diagnose(s): Lumbar spine pain  Post-Procedure Diagnose(s): Lumbar spine pain  Formatting of this note is different from the original.  CARE TEAM:  Patient Care Team:  Pcp, No as PCP - General (General Practice)    ASSESSMENT     1. Lumbar spine pain      There is no problem list on file for this patient.    HISTORY OF PRESENT ILLNESS   Chief Complaint: Pain of the Spine   Age: 61 y.o.  Sex: male     History of present illness: Antonio Melendez is a 61 y.o. male who presents to the procedure room today for an epidural steroid injection.    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   64483 epidural steroid injection.  2.   64484 epidural steroid injection.  3.   72274 Epidurogram.  4.   Use of fluoroscopy.    COMPLICATIONS:   None    PROCEDURE IN DETAIL:  The patient was laid prone on the operating table and prepped and draped in the normal fashion using Betadine.  Skin needles were used to anesthetize the skin.  Two 5-inch spinal needles were then placed in the transforaminal space using AP, oblique and lateral fluoroscopy view as a guide.   The AP view was then used for the epidurogram, which showed good epiradicular flow and no vascular uptake.  Then 2 cc of 10 mg Dexamethasone and 1 cc of  1% PF lidocaine was placed in each level.    EPIDUROGRAM:  Contrast was used.   There was good epiradicular flow.  There appeared to be some stenosis at the level of the injections confirming the diagnosis of stenosis.   No vascular uptake.    Medication Administered: 2 mL lidocaine PF 1 %; 2 mL iohexol 300 MG/ML; 2 mL dexAMETHasone Sod Phosphate PF 10 MG/ML    Needle Size:  25 G  Needle Length:  5.0 in  Pt Tolerated Procedure:  No immediate complications and well      Orders Placed This Encounter    POCT glucose       Electronically signed by Ned Card, MD at 12/01/2021 10:43 AM EDT

## 2021-12-01 NOTE — Progress Notes (Signed)
Formatting of this note is different from the original.  Intra Spine Procedure Flow Sheet    Patient:  Antonio Melendez  MRN: [id number]  Date: 12/01/21    INTRA-PROCEDURE  Physician: Ned Card, MD  Circulator:  Corrie Dandy RN   X-Ray Tech:  Barnet Pall RT  Other:    Patient In: 76  Procedure Time Out: 1008  Procedure Start: 3016  Procedure Stop: 0109  Fluoro Time:   Patient Out: 3235    Is this a kyphoplasty? No  Positioning of Patient: prone, pillow under head/trunk, pillow/wedge under hips, and safety strap  Patient prepped with Chlorhexidine by:     Procedures Left L4-L5,L5-S1 TFESI     Intra-operative Vital Signs:  Time HR Resp BP O2 sats End Tidal   CO2 LOC Rhythm   1007 106 16  96  AOx3            Moderate Sedation: No     Dressing applied: none  Discharged to post-procedure: Transferred from procedure room table to stretcher/wheelchair without incident and To post-procedure, free from injury and in satisfactory condition  Intra-Op Comments:  Intra-op Documented by: Katina Degree, RN    Electronically signed by Katina Degree, RN at 12/01/2021 10:11 AM EDT

## 2021-12-08 ENCOUNTER — Emergency Department: Admit: 2021-12-08 | Payer: BLUE CROSS/BLUE SHIELD

## 2021-12-08 ENCOUNTER — Inpatient Hospital Stay: Admit: 2021-12-08 | Payer: BLUE CROSS/BLUE SHIELD

## 2021-12-08 ENCOUNTER — Inpatient Hospital Stay
Admission: EM | Admit: 2021-12-08 | Discharge: 2021-12-10 | Disposition: A | Discharge status: Home / Self Care | Payer: BLUE CROSS/BLUE SHIELD

## 2021-12-08 DIAGNOSIS — R079 Chest pain, unspecified: Secondary | ICD-10-CM

## 2021-12-08 DIAGNOSIS — K852 Alcohol induced acute pancreatitis without necrosis or infection: Secondary | ICD-10-CM

## 2021-12-08 LAB — ECHO (TTE) COMPLETE (PRN CONTRAST/BUBBLE/STRAIN/3D)
AV Area by Peak Velocity: 3.4 cm2
AV Area by VTI: 3.7 cm2
AV Mean Gradient: 4 mmHg
AV Mean Velocity: 0.9 m/s
AV Peak Gradient: 7 mmHg
AV Peak Velocity: 1.3 m/s
AV VTI: 21.4 cm
AV Velocity Ratio: 0.85
AVA/BSA Peak Velocity: 1.7 cm2/m2
AVA/BSA VTI: 1.8 cm2/m2
Ao Root Index: 1.62 cm/m2
Aortic Root: 3.3 cm
Ascending Aorta Index: 1.52 cm/m2
Ascending Aorta: 3.1 cm
Body Surface Area: 2.07 m2
E/E' Lateral: 7.11
E/E' Ratio (Averaged): 7.56
E/E' Septal: 8
Est. RA Pressure: 15 mmHg
Fractional Shortening 2D: 29 % (ref 28–44)
IVSd: 0.9 cm (ref 0.6–1.0)
LA Area 4C: 17.9 cm2
LA Diameter: 3.5 cm
LA Major Axis: 5.5 cm
LA Size Index: 1.72 cm/m2
LA Volume A-L A4C: 47 mL (ref 18–58)
LA Volume Index A-L A4C: 23 mL/m2 (ref 16–34)
LA/AO Root Ratio: 1.06
LV E' Lateral Velocity: 9 cm/s
LV E' Septal Velocity: 8 cm/s
LV EDV A4C: 87 mL
LV EDV Index A4C: 43 mL/m2
LV ESV A4C: 42 mL
LV ESV Index A4C: 21 mL/m2
LV Ejection Fraction A4C: 52 %
LV Mass 2D Index: 75.1 g/m2 (ref 49–115)
LV Mass 2D: 153.3 g (ref 88–224)
LV RWT Ratio: 0.49
LVIDd Index: 2.21 cm/m2
LVIDd: 4.5 cm (ref 4.2–5.9)
LVIDs Index: 1.57 cm/m2
LVIDs: 3.2 cm
LVOT Area: 4.2 cm2
LVOT Diameter: 2.3 cm
LVOT Mean Gradient: 2 mmHg
LVOT Peak Gradient: 5 mmHg
LVOT Peak Velocity: 1.1 m/s
LVOT SV: 78.5 ml
LVOT Stroke Volume Index: 38.5 mL/m2
LVOT VTI: 18.9 cm
LVOT:AV VTI Index: 0.88
LVPWd: 1.1 cm — AB (ref 0.6–1.0)
MV A Velocity: 0.81 m/s
MV Area by VTI: 4.7 cm2
MV E Velocity: 0.64 m/s
MV E Wave Deceleration Time: 247 ms
MV E/A: 0.79
MV Max Velocity: 0.9 m/s
MV Mean Gradient: 2 mmHg
MV Mean Velocity: 0.7 m/s
MV Peak Gradient: 3 mmHg
MV VTI: 16.7 cm
MV:LVOT VTI Index: 0.88
PV Max Velocity: 0.9 m/s
PV Peak Gradient: 4 mmHg
RA Area 4C: 15.6 cm2
RA Volume Index A4C: 18 mL/m2
RA Volume: 36 ml
RV Basal Dimension: 2.7 cm
RV Free Wall Peak S': 16 cm/s
RVSP: 37 mmHg
TAPSE: 2.4 cm (ref 1.7–?)
TR Max Velocity: 2.34 m/s
TR Peak Gradient: 22 mmHg

## 2021-12-08 LAB — CBC WITH AUTO DIFFERENTIAL
Absolute Immature Granulocyte: 0 10*3/uL (ref 0.00–0.04)
Basophils %: 0 % (ref 0–1)
Basophils Absolute: 0 10*3/uL (ref 0.0–0.1)
Eosinophils %: 1 % (ref 0–7)
Eosinophils Absolute: 0.1 10*3/uL (ref 0.0–0.4)
Hematocrit: 34.9 % — ABNORMAL LOW (ref 36.6–50.3)
Hemoglobin: 11.5 g/dL — ABNORMAL LOW (ref 12.1–17.0)
Immature Granulocytes: 0 % (ref 0.0–0.5)
Lymphocytes %: 40 % (ref 12–49)
Lymphocytes Absolute: 1.8 10*3/uL (ref 0.8–3.5)
MCH: 24.9 PG — ABNORMAL LOW (ref 26.0–34.0)
MCHC: 33 g/dL (ref 30.0–36.5)
MCV: 75.7 FL — ABNORMAL LOW (ref 80.0–99.0)
MPV: 10.3 FL (ref 8.9–12.9)
Monocytes %: 10 % (ref 5–13)
Monocytes Absolute: 0.5 10*3/uL (ref 0.0–1.0)
Neutrophils %: 49 % (ref 32–75)
Neutrophils Absolute: 2.2 10*3/uL (ref 1.8–8.0)
Platelets: 243 10*3/uL (ref 150–400)
RBC: 4.61 M/uL (ref 4.10–5.70)
RDW: 21.6 % — ABNORMAL HIGH (ref 11.5–14.5)
WBC: 4.5 10*3/uL (ref 4.1–11.1)

## 2021-12-08 LAB — EKG 12-LEAD
Atrial Rate: 122 {beats}/min
Atrial Rate: 96 {beats}/min
Diagnosis: NORMAL
P Axis: 59 degrees
P Axis: 57 degrees
P-R Interval: 162 ms
P-R Interval: 174 ms
Q-T Interval: 318 ms
Q-T Interval: 352 ms
QRS Duration: 70 ms
QRS Duration: 70 ms
QTc Calculation (Bazett): 453 ms
QTc Calculation (Bazett): 444 ms
R Axis: 27 degrees
R Axis: 7 degrees
T Axis: 40 degrees
T Axis: 29 degrees
Ventricular Rate: 122 {beats}/min
Ventricular Rate: 96 {beats}/min

## 2021-12-08 LAB — HEPATIC FUNCTION PANEL
ALT: 163 U/L — ABNORMAL HIGH (ref 12–78)
AST: 185 U/L — ABNORMAL HIGH (ref 15–37)
Albumin/Globulin Ratio: 0.9 — ABNORMAL LOW (ref 1.1–2.2)
Albumin: 3.4 g/dL — ABNORMAL LOW (ref 3.5–5.0)
Alk Phosphatase: 172 U/L — ABNORMAL HIGH (ref 45–117)
Bilirubin, Direct: 0.2 mg/dL (ref 0.0–0.2)
Globulin: 3.8 g/dL (ref 2.0–4.0)
Total Bilirubin: 0.3 mg/dL (ref 0.2–1.0)
Total Protein: 7.2 g/dL (ref 6.4–8.2)

## 2021-12-08 LAB — BRAIN NATRIURETIC PEPTIDE: NT Pro-BNP: 46 pg/mL (ref <125)

## 2021-12-08 LAB — APTT: PTT: 20.4 s — ABNORMAL LOW (ref 22.1–31.0)

## 2021-12-08 LAB — LIPASE: Lipase: 491 U/L — ABNORMAL HIGH (ref 13–75)

## 2021-12-08 LAB — POCT GLUCOSE
POC Glucose: 251 mg/dL — ABNORMAL HIGH (ref 65–100)
POC Glucose: 248 mg/dL — ABNORMAL HIGH (ref 65–100)
POC Glucose: 271 mg/dL — ABNORMAL HIGH (ref 65–100)

## 2021-12-08 LAB — BASIC METABOLIC PANEL
Anion Gap: 12 mmol/L (ref 5–15)
BUN: 13 mg/dL (ref 6–20)
Bun/Cre Ratio: 8 — ABNORMAL LOW (ref 12–20)
CO2: 26 mmol/L (ref 21–32)
Calcium: 8.6 mg/dL (ref 8.5–10.1)
Chloride: 94 mmol/L — ABNORMAL LOW (ref 97–108)
Creatinine: 1.56 mg/dL — ABNORMAL HIGH (ref 0.70–1.30)
Est, Glom Filt Rate: 51 mL/min/{1.73_m2} — ABNORMAL LOW (ref >60)
Glucose: 492 mg/dL — ABNORMAL HIGH (ref 65–100)
Potassium: 4.1 mmol/L (ref 3.5–5.1)
Sodium: 132 mmol/L — ABNORMAL LOW (ref 136–145)

## 2021-12-08 LAB — PROTIME-INR
INR: 1 (ref 0.9–1.1)
Protime: 9.4 s (ref 9.0–11.1)

## 2021-12-08 LAB — ETHANOL: Ethanol Lvl: 297 mg/dL — ABNORMAL HIGH (ref <10)

## 2021-12-08 LAB — TROPONIN: Troponin, High Sensitivity: 6 ng/L (ref 0–76)

## 2021-12-08 MED ORDER — LORAZEPAM 2 MG/ML IJ SOLN
2 MG/ML | INTRAMUSCULAR | Status: DC | PRN
Start: 2021-12-08 — End: 2021-12-10
  Administered 2021-12-08: 10:00:00 2 mg via INTRAVENOUS

## 2021-12-08 MED ORDER — INSULIN REGULAR HUMAN 100 UNIT/ML IJ SOLN
100 UNIT/ML | Freq: Once | INTRAMUSCULAR | Status: AC
Start: 2021-12-08 — End: 2021-12-08
  Administered 2021-12-08: 07:00:00 10 [IU] via SUBCUTANEOUS

## 2021-12-08 MED ORDER — INSULIN REGULAR HUMAN 100 UNIT/ML IJ SOLN
100 UNIT/ML | Freq: Three times a day (TID) | INTRAMUSCULAR | Status: DC
Start: 2021-12-08 — End: 2021-12-08

## 2021-12-08 MED ORDER — MULTIPLE VITAMINS PO TABS
Freq: Every day | ORAL | Status: DC
Start: 2021-12-08 — End: 2021-12-10
  Administered 2021-12-08 – 2021-12-10 (×3): 1 via ORAL

## 2021-12-08 MED ORDER — AMLODIPINE BESYLATE 5 MG PO TABS
5 MG | Freq: Once | ORAL | Status: AC
Start: 2021-12-08 — End: 2021-12-08
  Administered 2021-12-08: 09:00:00 10 mg via ORAL

## 2021-12-08 MED ORDER — METOPROLOL TARTRATE 25 MG PO TABS
25 MG | Freq: Two times a day (BID) | ORAL | Status: DC
Start: 2021-12-08 — End: 2021-12-10
  Administered 2021-12-08 – 2021-12-10 (×5): 25 mg via ORAL

## 2021-12-08 MED ORDER — INSULIN NPH ISOPHANE & REGULAR (70-30) 100 UNIT/ML SC SUPN
Freq: Two times a day (BID) | SUBCUTANEOUS | Status: DC
Start: 2021-12-08 — End: 2021-12-08

## 2021-12-08 MED ORDER — POLYETHYLENE GLYCOL 3350 17 G PO PACK
17 g | Freq: Every day | ORAL | Status: DC | PRN
Start: 2021-12-08 — End: 2021-12-10

## 2021-12-08 MED ORDER — LORAZEPAM 1 MG PO TABS
1 MG | ORAL | Status: DC | PRN
Start: 2021-12-08 — End: 2021-12-10
  Administered 2021-12-09 – 2021-12-10 (×4): 1 mg via ORAL

## 2021-12-08 MED ORDER — SODIUM CHLORIDE 0.9 % IV BOLUS
0.9 % | INTRAVENOUS | Status: AC
Start: 2021-12-08 — End: 2021-12-08
  Administered 2021-12-08: 07:00:00 1000 mL via INTRAVENOUS

## 2021-12-08 MED ORDER — ASPIRIN 325 MG PO TABS
325 MG | ORAL | Status: AC
Start: 2021-12-08 — End: 2021-12-08
  Administered 2021-12-08: 06:00:00 325 mg via ORAL

## 2021-12-08 MED ORDER — AMLODIPINE BESYLATE 5 MG PO TABS
5 MG | Freq: Every day | ORAL | Status: DC
Start: 2021-12-08 — End: 2021-12-08

## 2021-12-08 MED ORDER — THIAMINE HCL 100 MG/ML IJ SOLN
100 MG/ML | Freq: Every day | INTRAMUSCULAR | Status: DC
Start: 2021-12-08 — End: 2021-12-10
  Administered 2021-12-08 – 2021-12-10 (×3): 100 mg via INTRAVENOUS

## 2021-12-08 MED ORDER — ONDANSETRON HCL 4 MG/2ML IJ SOLN
4 MG/2ML | Freq: Four times a day (QID) | INTRAMUSCULAR | Status: DC | PRN
Start: 2021-12-08 — End: 2021-12-10

## 2021-12-08 MED ORDER — THIAMINE HCL 100 MG/ML IJ SOLN
100 | Freq: Every day | INTRAMUSCULAR | Status: DC
Start: 2021-12-08 — End: 2021-12-08
  Administered 2021-12-08: 06:00:00 100 mg via INTRAVENOUS

## 2021-12-08 MED ORDER — ACETAMINOPHEN 325 MG PO TABS
325 MG | Freq: Once | ORAL | Status: AC
Start: 2021-12-08 — End: 2021-12-08
  Administered 2021-12-08: 22:00:00 650 mg via ORAL

## 2021-12-08 MED ORDER — ALUM & MAG HYDROXIDE-SIMETH 200-200-20 MG/5ML PO SUSP
200-200-20 MG/5ML | ORAL | Status: AC
Start: 2021-12-08 — End: 2021-12-08
  Administered 2021-12-08: 06:00:00 15 mL via ORAL

## 2021-12-08 MED ORDER — LORAZEPAM 2 MG/ML IJ SOLN
2 MG/ML | INTRAMUSCULAR | Status: DC | PRN
Start: 2021-12-08 — End: 2021-12-10
  Administered 2021-12-08 (×3): 1 mg via INTRAVENOUS

## 2021-12-08 MED ORDER — THIAMINE HCL 100 MG/ML IJ SOLN
100 | INTRAMUSCULAR | Status: DC
Start: 2021-12-08 — End: 2021-12-08

## 2021-12-08 MED ORDER — INSULIN LISPRO 100 UNIT/ML IJ SOLN
100 UNIT/ML | Freq: Three times a day (TID) | INTRAMUSCULAR | Status: DC
Start: 2021-12-08 — End: 2021-12-10
  Administered 2021-12-08: 16:00:00 2 [IU] via SUBCUTANEOUS
  Administered 2021-12-08 (×2): 4 [IU] via SUBCUTANEOUS
  Administered 2021-12-09: 16:00:00 2 [IU] via SUBCUTANEOUS

## 2021-12-08 MED ORDER — LORAZEPAM 1 MG PO TABS
1 MG | ORAL | Status: DC | PRN
Start: 2021-12-08 — End: 2021-12-10
  Administered 2021-12-09 – 2021-12-10 (×2): 2 mg via ORAL

## 2021-12-08 MED ORDER — LORAZEPAM 1 MG PO TABS
1 MG | ORAL | Status: DC | PRN
Start: 2021-12-08 — End: 2021-12-10

## 2021-12-08 MED ORDER — LISINOPRIL 10 MG PO TABS
10 MG | Freq: Every day | ORAL | Status: DC
Start: 2021-12-08 — End: 2021-12-10
  Administered 2021-12-08 – 2021-12-10 (×3): 40 mg via ORAL

## 2021-12-08 MED ORDER — LORAZEPAM 2 MG/ML IJ SOLN
2 MG/ML | INTRAMUSCULAR | Status: DC | PRN
Start: 2021-12-08 — End: 2021-12-10
  Administered 2021-12-08: 20:00:00 3 mg via INTRAVENOUS

## 2021-12-08 MED ORDER — LORAZEPAM 2 MG/ML IJ SOLN
2 MG/ML | INTRAMUSCULAR | Status: DC | PRN
Start: 2021-12-08 — End: 2021-12-10

## 2021-12-08 MED ORDER — ONDANSETRON HCL 4 MG/2ML IJ SOLN
4 MG/2ML | Freq: Four times a day (QID) | INTRAMUSCULAR | Status: DC | PRN
Start: 2021-12-08 — End: 2021-12-08

## 2021-12-08 MED ORDER — DEXTROSE 10 % IV BOLUS
INTRAVENOUS | Status: DC | PRN
Start: 2021-12-08 — End: 2021-12-10

## 2021-12-08 MED ORDER — INSULIN LISPRO 100 UNIT/ML IJ SOLN
100 UNIT/ML | Freq: Three times a day (TID) | INTRAMUSCULAR | Status: DC
Start: 2021-12-08 — End: 2021-12-10
  Administered 2021-12-08 – 2021-12-10 (×4): 7 [IU] via SUBCUTANEOUS

## 2021-12-08 MED ORDER — FAMOTIDINE 20 MG PO TABS
20 MG | ORAL | Status: AC
Start: 2021-12-08 — End: 2021-12-08
  Administered 2021-12-08: 06:00:00 20 mg via ORAL

## 2021-12-08 MED ORDER — ENOXAPARIN SODIUM 40 MG/0.4ML IJ SOSY
40 MG/0.4ML | Freq: Every day | INTRAMUSCULAR | Status: DC
Start: 2021-12-08 — End: 2021-12-10
  Administered 2021-12-08 – 2021-12-10 (×3): 40 mg via SUBCUTANEOUS

## 2021-12-08 MED ORDER — PANTOPRAZOLE SODIUM 40 MG PO TBEC
40 MG | Freq: Every day | ORAL | Status: DC
Start: 2021-12-08 — End: 2021-12-10
  Administered 2021-12-08 – 2021-12-10 (×3): 40 mg via ORAL

## 2021-12-08 MED ORDER — ENOXAPARIN SODIUM 40 MG/0.4ML IJ SOSY
40 MG/0.4ML | Freq: Every day | INTRAMUSCULAR | Status: DC
Start: 2021-12-08 — End: 2021-12-08

## 2021-12-08 MED ORDER — ONDANSETRON 4 MG PO TBDP
4 MG | Freq: Three times a day (TID) | ORAL | Status: DC | PRN
Start: 2021-12-08 — End: 2021-12-08

## 2021-12-08 MED ORDER — INSULIN LISPRO 100 UNIT/ML IJ SOLN
100 UNIT/ML | Freq: Every evening | INTRAMUSCULAR | Status: DC
Start: 2021-12-08 — End: 2021-12-10

## 2021-12-08 MED ORDER — GLUCAGON (RDNA) 1 MG IJ KIT
1 MG | INTRAMUSCULAR | Status: DC | PRN
Start: 2021-12-08 — End: 2021-12-10

## 2021-12-08 MED ORDER — SODIUM CHLORIDE 0.9 % IV SOLN
0.9 % | INTRAVENOUS | Status: DC
Start: 2021-12-08 — End: 2021-12-10
  Administered 2021-12-08 – 2021-12-10 (×5): via INTRAVENOUS

## 2021-12-08 MED ORDER — INSULIN GLARGINE 100 UNIT/ML SC SOLN
100 UNIT/ML | Freq: Every evening | SUBCUTANEOUS | Status: DC
Start: 2021-12-08 — End: 2021-12-10
  Administered 2021-12-09 – 2021-12-10 (×2): 43 [IU] via SUBCUTANEOUS

## 2021-12-08 MED ORDER — ONDANSETRON 4 MG PO TBDP
4 MG | Freq: Three times a day (TID) | ORAL | Status: DC | PRN
Start: 2021-12-08 — End: 2021-12-10

## 2021-12-08 MED ORDER — DEXTROSE 10 % IV SOLN
10 % | INTRAVENOUS | Status: DC | PRN
Start: 2021-12-08 — End: 2021-12-10

## 2021-12-08 MED ORDER — AMLODIPINE BESYLATE 5 MG PO TABS
5 MG | Freq: Every day | ORAL | Status: DC
Start: 2021-12-08 — End: 2021-12-10
  Administered 2021-12-09 – 2021-12-10 (×2): 10 mg via ORAL

## 2021-12-08 MED ORDER — GLUCOSE 4 G PO CHEW
4 g | ORAL | Status: DC | PRN
Start: 2021-12-08 — End: 2021-12-10

## 2021-12-08 MED FILL — INSULIN LISPRO 100 UNIT/ML IJ SOLN: 100 UNIT/ML | INTRAMUSCULAR | Qty: 2

## 2021-12-08 MED FILL — INSULIN LISPRO 100 UNIT/ML IJ SOLN: 100 UNIT/ML | INTRAMUSCULAR | Qty: 4

## 2021-12-08 MED FILL — METOPROLOL TARTRATE 25 MG PO TABS: 25 MG | ORAL | Qty: 1

## 2021-12-08 MED FILL — LORAZEPAM 2 MG/ML IJ SOLN: 2 MG/ML | INTRAMUSCULAR | Qty: 1

## 2021-12-08 MED FILL — INSULIN LISPRO 100 UNIT/ML IJ SOLN: 100 UNIT/ML | INTRAMUSCULAR | Qty: 7

## 2021-12-08 MED FILL — SODIUM CHLORIDE 0.9 % IV SOLN: 0.9 % | INTRAVENOUS | Qty: 1000

## 2021-12-08 MED FILL — THIAMINE HCL 200 MG/2ML IJ SOLN: 200 MG/2ML | INTRAMUSCULAR | Qty: 1

## 2021-12-08 MED FILL — LORAZEPAM 2 MG/ML IJ SOLN: 2 MG/ML | INTRAMUSCULAR | Qty: 2

## 2021-12-08 MED FILL — PANTOPRAZOLE SODIUM 40 MG PO TBEC: 40 MG | ORAL | Qty: 1

## 2021-12-08 MED FILL — ONCE DAILY PO TABS: ORAL | Qty: 1

## 2021-12-08 MED FILL — HUMULIN R 100 UNIT/ML IJ SOLN: 100 UNIT/ML | INTRAMUSCULAR | Qty: 10

## 2021-12-08 MED FILL — FAMOTIDINE 20 MG PO TABS: 20 MG | ORAL | Qty: 1

## 2021-12-08 MED FILL — MAG-AL PLUS 200-200-20 MG/5ML PO LIQD: 200-200-20 MG/5ML | ORAL | Qty: 30

## 2021-12-08 MED FILL — LISINOPRIL 10 MG PO TABS: 10 MG | ORAL | Qty: 4

## 2021-12-08 MED FILL — ASPIRIN 325 MG PO TABS: 325 MG | ORAL | Qty: 1

## 2021-12-08 MED FILL — DEXTROSE 10 % IV SOLN: 10 % | INTRAVENOUS | Qty: 1000

## 2021-12-08 MED FILL — THIAMINE HCL 100 MG/ML IJ SOLN: 100 MG/ML | INTRAMUSCULAR | Qty: 2

## 2021-12-08 MED FILL — ENOXAPARIN SODIUM 40 MG/0.4ML IJ SOSY: 40 MG/0.4ML | INTRAMUSCULAR | Qty: 0.4

## 2021-12-08 MED FILL — TYLENOL 325 MG PO TABS: 325 MG | ORAL | Qty: 2

## 2021-12-08 MED FILL — AMLODIPINE BESYLATE 5 MG PO TABS: 5 MG | ORAL | Qty: 2

## 2021-12-08 NOTE — Progress Notes (Signed)
Overnight admission.  Briefly patient is a 61 year old male with chronic alcohol abuse, drinks something like vodka half a handle every day and is presenting with acute alcoholic pancreatitis, elevated LFT.  He has been tachycardic with heart rate between 108-120.  Also systolic blood pressure is between 130-170.  Adding metoprolol 25 mg bid.  Appreciate cardiology input.  Her troponin was initially 7 that trended down to 6.  Echocardiogram pending.  He is also on CIWA protocol and getting IV Ativan.  Discussed with cardiology, daughter and RN  We will follow-up with the patient.

## 2021-12-08 NOTE — Progress Notes (Signed)
Spiritual Care Assessment/Progress Note  [facility]    Name: Antonio JABLON Sr. MRN: [id number]    Age: 61 y.o.     Sex: male   Language: English     Date: 12/08/2021            Total Time Calculated: 18 min              Spiritual Assessment begun in [facility] [place]  Service Provided For:: Patient  Referral/Consult From:: Rounding  Encounter Overview/Reason : Initial Encounter    Spiritual beliefs:      []  Involved in a faith tradition/spiritual practice:      []  Supported by a faith community:      []  Claims no spiritual orientation:      []  Seeking spiritual identity:           []  Adheres to an individual form of spirituality:      [x]  Not able to assess:                Identified resources for coping and support system:   Support System: Friends/neighbors       []  Prayer                  []  Devotional reading               []  Music                  []  Guided Imagery     []  Pet visits                                        []  Other: (COMMENT)     Specific area/focus of visit   Encounter:    Crisis:    Spiritual/Emotional needs: Type: Spiritual Support  Ritual, Rites and Sacraments:    Grief, Loss, and Adjustments:    Ethics/Mediation:    Behavioral Health:    Palliative Care:    Advance Care Planning:      Plan/Referrals: Other (Comment) (Chaplain is available if needed)    Narrative: Rounding visit in [place]. Patient was present, and was resting peacefully. Patient has a companion but pt's friend was not present during the visit. Patient expressed his feeling of peace. Chaplain provided the ministry of presence with an active listening. Advised of chaplain availability. Patient expressed his appreciation for chaplain's visit. Please, contact the spiritual care team if there are any spiritual/emotional support needs.     Rev. [place], [place].  Chaplain Resident  Page a chaplain: [phone]

## 2021-12-08 NOTE — ED Provider Notes (Signed)
EMERGENCY DEPARTMENT HISTORY AND PHYSICAL EXAM      Date: 12/08/2021  Patient Name: Antonio HAROON Sr.    History of Presenting Illness       History Provided By: Patient    HPI: Antonio Bast Sr., 61 y.o. male presents to the ED with cc of chest pain.  Patient complains of sternal discomfort that began about 5 PM yesterday.  Pain has been intermittent lasting several minutes at a time.  Patient was unable to describe the exact description of the pain however it is not associated with radiation, diaphoresis, or nausea.  Patient complains of mild shortness of breath with the chest pain.  Patient also complains of intermittent episode of hiccuping.  Patient relates show daily use of EtOH.  Patient has history of diabetes, hypertension, and coronary artery disease.  No OTC treatment.  No URI symptoms.  No fever or chills.  No signs of GI bleeding.      Past History     Past Medical History:  Past Medical History:   Diagnosis Date    Depression     Diabetes ([place])     ETOH abuse     High cholesterol     Hypertension        Past Surgical History:  Past Surgical History:   Procedure Laterality Date    CHOLECYSTECTOMY      ROTATOR CUFF REPAIR Right        Family History:  Family History   Problem Relation Age of Onset    Hypertension Mother        Social History:  Social History     Tobacco Use    Smoking status: Never    Smokeless tobacco: Never   Vaping Use    Vaping Use: Never used   Substance Use Topics    Alcohol use: Yes     Alcohol/week: 74.0 standard drinks of alcohol     Types: 74 Standard drinks or equivalent per week    Drug use: Not Currently       Allergies:  No Known Allergies      Review of Systems       Physical Exam   Physical Exam  Vitals and nursing note reviewed.   Constitutional:       General: He is not in acute distress.     Appearance: Normal appearance. He is not ill-appearing, toxic-appearing or diaphoretic.   HENT:      Head: Normocephalic and atraumatic.      Nose: No congestion or rhinorrhea.       Mouth/Throat:      Mouth: Mucous membranes are moist.      Pharynx: No oropharyngeal exudate or posterior oropharyngeal erythema.   Eyes:      General: No scleral icterus.     Extraocular Movements: Extraocular movements intact.      Conjunctiva/sclera: Conjunctivae normal.      Pupils: Pupils are equal, round, and reactive to light.      Comments: No photophobia.   Cardiovascular:      Rate and Rhythm: Regular rhythm. Tachycardia present.      Heart sounds: Normal heart sounds.   Pulmonary:      Effort: Pulmonary effort is normal.      Breath sounds: Normal breath sounds.   Abdominal:      General: Bowel sounds are normal.      Palpations: Abdomen is soft.      Tenderness: There is no abdominal tenderness. There is  no right CVA tenderness, left CVA tenderness, guarding or rebound.   Musculoskeletal:      Cervical back: Neck supple. No rigidity.      Right lower leg: No edema.      Left lower leg: No edema.   Skin:     General: Skin is warm and dry.   Neurological:      General: No focal deficit present.      Mental Status: He is alert and oriented to person, place, and time.      Cranial Nerves: No cranial nerve deficit.      Motor: No weakness.   Psychiatric:         Mood and Affect: Mood normal.         Behavior: Behavior normal.         Diagnostic Study Results     Labs -     Recent Results (from the past 12 hour(s))   Basic Metabolic Panel    Collection Time: 12/08/21  1:00 AM   Result Value Ref Range    Sodium 132 (L) 136 - 145 mmol/L    Potassium 4.1 3.5 - 5.1 mmol/L    Chloride 94 (L) 97 - 108 mmol/L    CO2 26 21 - 32 mmol/L    Anion Gap 12 5 - 15 mmol/L    Glucose 492 (H) 65 - 100 mg/dL    BUN 13 6 - 20 mg/dL    Creatinine 1.56 (H) 0.70 - 1.30 mg/dL    Bun/Cre Ratio 8 (L) 12 - 20      Est, Glom Filt Rate 51 (L) >60 ml/min/1.95m    Calcium 8.6 8.5 - 10.1 mg/dL   CBC with Auto Differential    Collection Time: 12/08/21  1:00 AM   Result Value Ref Range    WBC 4.5 4.1 - 11.1 K/uL    RBC 4.61 4.10 - 5.70  M/uL    Hemoglobin 11.5 (L) 12.1 - 17.0 g/dL    Hematocrit 34.9 (L) 36.6 - 50.3 %    MCV 75.7 (L) 80.0 - 99.0 FL    MCH 24.9 (L) 26.0 - 34.0 PG    MCHC 33.0 30.0 - 36.5 g/dL    RDW 21.6 (H) 11.5 - 14.5 %    Platelets 243 150 - 400 K/uL    MPV 10.3 8.9 - 12.9 FL    Neutrophils % 49 32 - 75 %    Lymphocytes % 40 12 - 49 %    Monocytes % 10 5 - 13 %    Eosinophils % 1 0 - 7 %    Basophils % 0 0 - 1 %    Immature Granulocytes 0 0.0 - 0.5 %    Neutrophils Absolute 2.2 1.8 - 8.0 K/UL    Lymphocytes Absolute 1.8 0.8 - 3.5 K/UL    Monocytes Absolute 0.5 0.0 - 1.0 K/UL    Eosinophils Absolute 0.1 0.0 - 0.4 K/UL    Basophils Absolute 0.0 0.0 - 0.1 K/UL    Absolute Immature Granulocyte 0.0 0.00 - 0.04 K/UL    Differential Type AUTOMATED     Brain Natriuretic Peptide    Collection Time: 12/08/21  1:00 AM   Result Value Ref Range    NT Pro-BNP 46 <125 pg/mL   Troponin    Collection Time: 12/08/21  1:00 AM   Result Value Ref Range    Troponin, High Sensitivity 6 0 - 76 ng/L   APTT    Collection  Time: 12/08/21  1:00 AM   Result Value Ref Range    PTT 20.4 (L) 22.1 - 31.0 sec    Therapeutic Range   82 - 109 sec   Protime-INR    Collection Time: 12/08/21  1:00 AM   Result Value Ref Range    Protime 9.4 9.0 - 11.1 sec    INR 1.0 0.9 - 1.1     Hepatic Function Panel    Collection Time: 12/08/21  1:00 AM   Result Value Ref Range    Total Protein 7.2 6.4 - 8.2 g/dL    Albumin 3.4 (L) 3.5 - 5.0 g/dL    Globulin 3.8 2.0 - 4.0 g/dL    Albumin/Globulin Ratio 0.9 (L) 1.1 - 2.2      Total Bilirubin 0.3 0.2 - 1.0 mg/dL    Bilirubin, Direct 0.2 0.0 - 0.2 mg/dL    Alk Phosphatase 172 (H) 45 - 117 U/L    AST 185 (H) 15 - 37 U/L    ALT 163 (H) 12 - 78 U/L   ETOH    Collection Time: 12/08/21  1:22 AM   Result Value Ref Range    Ethanol Lvl 297 (H) <10 mg/dL   Lipase    Collection Time: 12/08/21  1:22 AM   Result Value Ref Range    Lipase 491 (H) 13 - 75 U/L     EKG interpreted by me sinus tachycardia rate of 122 normal QRS QT nonspecific ST-T wave  changes no STEMI  Radiologic Studies -   _0 @  _1 @  _2 @    Medical Decision Making and ED Course   I am the first provider for this patient.    I reviewed the vital signs, available nursing notes, past medical history, past surgical history, family history and social history.    Patient with a multiple risk factor for coronary artery disease presents with complaints of substernal chest discomfort that began last night.    Composition of the HEART score for chest pain, angina, NSTEMI in the ED.    HEART score criteria             Score  History: Highly suspicious    2    Moderately suspicious   1    Slightly or non-suspicious   0    ECG:  Significant ST depression   2    Nonspecific repolarisation disturbance 1    Normal      0    Age:  > or = 65 years    2    > 45 to < 65 years    1    < Or = to 45 years    0    Risk factors: > or = to 3 risk factors or history of CAD 2    1 or 2 risk factors    1    No risk factors known    0    Troponin: > or = to 3x normal limit   2    > 1 to < 3x normal limit   1    < or = to normal limit    0    Calculated Total : ___7___         0-3: 0.9-1.7% risk of adverse cardiac event. In the HEART Score, these patients were  discharged.   4-6: 12-16.6% risk of adverse cardiac event. In the HEART Score, these patients were  admitted to the hospital.   =7: 50-65% risk  of adverse cardiac event. In the HEART Score, these patients were  candidates for early invasive measures.      EKG showed no STEMI.  First troponin is negative.  Patient was given dose of aspirin.  Patient also shows hyperglycemia without DKA.  Liver enzymes abnormal.  Lipase is elevated.  No clinical symptoms signs of biliary colic or pancreatitis.  Patient is nontoxic-appearing.  No clinical concern for sepsis.  Patient was given 2 L of normal saline ordered 10 units of regular subcu insulin for hyperglycemia  Dose of thiamine was given due to his chronic EtOH abuse.    I discussed with hospitalist  admission    Vital Signs-Reviewed the patient's vital signs.      Records Reviewed: Nursing note reviewed      ED Course:   Initial assessment performed. The patients presenting problems have been discussed, and they are in agreement with the care plan formulated and outlined with them.  I have encouraged them to ask questions as they arise throughout their visit.            Procedures     CRITICAL CARE NOTE :  2:45 AM  Amount of Critical Care Time: _30___(minutes)__    IMPENDING DETERIORATION -Cardiovascular and Metabolic  ASSOCIATED RISK FACTORS - Metabolic changes  MANAGEMENT- Bedside Assessment, Supervision of Care, and Transfer  INTERPRETATION -  Xrays and ECG  INTERVENTIONS - Metobolic interventions  CASE REVIEW - Hospitalist/Intensivist and Family  TREATMENT RESPONSE -Improved and Stable  PERFORMED BY - Self    NOTES   :  I have spent 30 minutes of critical care time involved in lab review, consultations with specialist, family decision- making, bedside attention and documentation. This time excludes time spent in any separate billed procedures.  During this entire length of time I was immediately available to the patient .    Taevyn Hausen Roe Rutherford, MD      Disposition     DISPOSITION Admitted 12/08/2021 02:15:53 AM        DISCHARGE PLAN:  1.   Current Discharge Medication List        CONTINUE these medications which have NOT CHANGED    Details   ondansetron (ZOFRAN-ODT) 4 MG disintegrating tablet Take 1 tablet by mouth 3 times daily as needed for Nausea or Vomiting  Qty: 12 tablet, Refills: 0      amLODIPine (NORVASC) 10 MG tablet Take 1 tablet by mouth daily      Insulin NPH Isophane & Regular (NOVOLIN 70/30 FLEXPEN) (70-30) 100 UNIT per ML injection pen Inject 48 units qAM, 28 units qhs      insulin regular (HUMULIN R;NOVOLIN R) 100 UNIT/ML injection Inject into the skin      lisinopril (PRINIVIL;ZESTRIL) 40 MG tablet Take 1 tablet by mouth      metFORMIN (GLUCOPHAGE-XR) 500 MG extended release tablet Take 1  tablet by mouth daily      omeprazole (PRILOSEC) 40 MG delayed release capsule Take 1 capsule by mouth daily      sucralfate (CARAFATE) 1 GM tablet Take 1 tablet by mouth 4 times daily (before meals and nightly)           2. _0 @  3.  Return to ED if worse     Diagnosis     Clinical Impression:   1. Acute chest pain    2. Hyperglycemia    3. Acute alcoholic intoxication without complication ([place])    4. Alcoholic hepatitis, unspecified whether ascites present  Attestations:    Leonia Corona, MD    Please note that this dictation was completed with Dragon, the computer voice recognition software.  Quite often unanticipated grammatical, syntax, homophones, and other interpretive errors are inadvertently transcribed by the computer software.  Please disregard these errors.  Please excuse any errors that have escaped final proofreading.  Thank you.          Levada Schilling, MD  12/08/21 [phone]

## 2021-12-08 NOTE — H&P (Signed)
History and Physical  Tele-Hospitalist, Tele-Medicine encounter      Patient:  Antonio MALTOS Sr.    CHIEF COMPLAINT:    Chest pain    HISTORY OF PRESENT ILLNESS:   The patient is a 61 y.o. male presented to emergency department with chest pain that started few days ago, off-and-on, on the left side and sharp in nature.  He has some nausea and vomited twice today.  Denies abdominal pain no diarrhea or constipation.  No shortness of breath.  He does have history of alcohol abuse and for the last 3 days he is drinking almost on daily basis.  Denies history of coronary artery disease.    Past Medical History:      Diagnosis Date    Depression     Diabetes ([facility])     ETOH abuse     High cholesterol     Hypertension        Past Surgical History:      Procedure Laterality Date    CHOLECYSTECTOMY      ROTATOR CUFF REPAIR Right        Medications Prior to Admission:    Prior to Admission medications    Medication Sig Start Date End Date Taking? Authorizing Provider   ondansetron (ZOFRAN-ODT) 4 MG disintegrating tablet Take 1 tablet by mouth 3 times daily as needed for Nausea or Vomiting 09/02/21   Rosalin Hawking, MD   amLODIPine (NORVASC) 10 MG tablet Take 1 tablet by mouth daily 08/26/20   Automatic Reconciliation, Ar   Insulin NPH Isophane & Regular (NOVOLIN 70/30 FLEXPEN) (70-30) 100 UNIT per ML injection pen Inject 48 units qAM, 28 units qhs 08/02/20   Automatic Reconciliation, Ar   insulin regular (HUMULIN R;NOVOLIN R) 100 UNIT/ML injection Inject into the skin    Automatic Reconciliation, Ar   lisinopril (PRINIVIL;ZESTRIL) 40 MG tablet Take 1 tablet by mouth 01/18/21   Automatic Reconciliation, Ar   metFORMIN (GLUCOPHAGE-XR) 500 MG extended release tablet Take 1 tablet by mouth daily 07/14/20   Automatic Reconciliation, Ar   omeprazole (PRILOSEC) 40 MG delayed release capsule Take 1 capsule by mouth daily    Automatic Reconciliation, Ar   sucralfate (CARAFATE) 1 GM tablet Take 1 tablet by mouth 4 times daily (before meals and  nightly) 08/05/20   Automatic Reconciliation, Ar       Allergies:  Patient has no known allergies.    Social History:   TOBACCO:   reports that he has never smoked. He has never used smokeless tobacco.  ETOH:   reports current alcohol use of about 74.0 standard drinks of alcohol per week.      Family History:       Problem Relation Age of Onset    Hypertension Mother        Review of systems  General: denies chills,  fever,  weakness,   Eyes: denies change in vision, eye irritation,   Ear, nose, throat: denies change in voice, sinus problems, or  difficulty swallowing.  Cardiovascular: As mentioned above in HPI respiratory: denies cough, shortness of breath.  GI: denies abdominal pain, change in appetite, change in bowels, heartburn/indigestion, nausea or vomiting  Musculoskeletal: denies muscle pain, cramping, joint pain, stiffness, swelling   Neurological: denies seizures, tingling, numbness, tremors, paralysis, trouble with balance.  Skin: denies rash, itching, healing problems,  Endocrine: denies excessive thirst,   Psychiatric: denies anxiety, depression,     Vitals:    12/08/21 0205   BP: (!) 136/90  Pulse: (!) 114   Resp: 18   Temp:    SpO2: 99%     vital signs reviewed in electronic chart    Physical exam    General Appearance: Alert, No acute distress.   HEENT: Head is normocephalic, atraumatic. Extra ocular movements intact.   EYES: No scleral icterus. No conjunctival pallor.  Neck: Normal inspection. Supple  Respiratory: Equal breath sounds bilaterally. Clear to auscultation. Respiratory effort normal. No crepts or wheezing.  Cardiovascular: Normal heart sounds. Regular S1-S1, no murmur.  Abdominal: Abdomen is soft. No Epigastric tenderness. Normoactive BS   Extremities: Pulses equal in all extremities. No LE edema.  Neurological: Alert and oriented X3- no focal neurological deficits.  Musculoskeletal: normal ROM, no joint swelling or tenderness.  Skin: Warm, dry, intact. No rashes   Psychiatric;  appropriate mood and affect.    Patient was evaluated through a synchronous (real-time) audio-video encounter using  tele-medicine cart. This virtual visit was conducted with patient's consent.  Nurse was present during the encounter.        Lab Results   Component Value Date    NA 132 (L) 12/08/2021    K 4.1 12/08/2021    CL 94 (L) 12/08/2021    CO2 26 12/08/2021    BUN 13 12/08/2021    CREATININE 1.56 (H) 12/08/2021    GLUCOSE 492 (H) 12/08/2021    CALCIUM 8.6 12/08/2021    PROT 7.2 12/08/2021    LABALBU 3.4 (L) 12/08/2021    BILITOT 0.3 12/08/2021    ALKPHOS 172 (H) 12/08/2021    AST 185 (H) 12/08/2021    ALT 163 (H) 12/08/2021    LABGLOM 51 (L) 12/08/2021    GFRAA 49 (L) 09/27/2020    AGRATIO 0.7 (L) 04/26/2021    GLOB 3.8 12/08/2021           Lab Results   Component Value Date    WBC 4.5 12/08/2021    HGB 11.5 (L) 12/08/2021    HCT 34.9 (L) 12/08/2021    MCV 75.7 (L) 12/08/2021    PLT 243 12/08/2021     @LASTTROPONINS @    Xray Result (most recent):  XR CHEST PORTABLE 12/08/2021    Narrative  EXAM:  XR CHEST PORTABLE    INDICATION: Chest pain    COMPARISON: none    TECHNIQUE: Semiupright portable chest AP view    FINDINGS: Cardiac monitoring leads. The cardiac silhouette is within normal  limits. The pulmonary vasculature is within normal limits.    The lungs and pleural spaces are clear. The visualized bones and upper abdomen  are age-appropriate.    Impression  No acute process on portable chest.      CT Result (most recent):  CT HEAD WO CONTRAST 09/02/2021    Narrative  EXAM: CT HEAD WO CONTRAST    INDICATION: ha    COMPARISON: None.    CONTRAST: None.    TECHNIQUE: Unenhanced CT of the head was performed using 5 mm images. Brain and  bone windows were generated. Coronal and sagittal reformats. CT dose reduction  was achieved through use of a standardized protocol tailored for this  examination and automatic exposure control for dose modulation.    FINDINGS:  The ventricles and sulci are normal in size, shape  and configuration. There is  no significant white matter disease. There is no intracranial hemorrhage,  extra-axial collection, or mass effect. The basilar cisterns are open. No CT  evidence of acute infarct.    The bone  windows demonstrate no abnormalities. The visualized portions of the  paranasal sinuses and mastoid air cells are clear.    Impression  No acute intracranial process.        Labs personally reviewed: CBC, CMP,   Xray personally reviewed.  EKG sinus rhythm, no acute dynamic changes    Case discussed with ED provider regarding clinical presentation, labs/investigations and possible admission.  Previous medical records reviewed.      Medical Decision Making (MDM) and Assessment and Plan     Chest pain  ETOH abuse  Suspected Alcoholic pancreatitis  Elevated liver enzymes due to ETOH abuse  Medical noncompliance  Essential hypertension    Hyperglycemia due to diabetes mellitus type 2, requiring insulin  Hyperlipidemia  Diabetic neuropathy    PLAN:  admit to med surg   R/O ACS with serial troponins and EKG. TTE. Cardiology consult.    He has etoh abuse hx and lipase is elevated suggestive of acute alcoholic pancreatitis but no abdominal pain but has nausea/vomiting.  CIWA protocol.  Ativan prn.  No evidence of alcohol withdrawal symptoms currently    Blood glucoses in the 400s but no evidence of DKA.  Resume home dose of insulin along with sliding scale and get HbA1c.  Hold metformin.    DVT Prophylaxis: heparin s  Code status: full code    History obtained from Patient.  Plan of care d/w patient.     This is telemedicine encounter. Total Time to see patient, doing H&P and Tele-medicine encounter: 55 minutes.    Patient Location: In Hospital in [place]  MD Location : Home office in [place]    Discussion/MDM: Patient with multiple medical comorbidities, each with high likelihood for morbidity and mortality if left untreated.  I have reviewed patient's presenting subjective and objective findings, as well  as all laboratory studies, imaging studies, and vital signs to date as well as treatment rendered and patient's response to those treatments.  In addition, prior medical, surgical and relevant social and family histories were reviewed.    Electronically signed by Olen Cordial, MD on 12/08/2021 at 2:28 AM

## 2021-12-08 NOTE — ED Notes (Signed)
Pt report given to [facility] for pt transport to main campus inpatient [place], [place]  12/08/21 [phone]

## 2021-12-08 NOTE — Consults (Signed)
Cardiology Consult    NAME: Antonio HUSTEAD Sr.   DOB:  Jul 10, 1960   MRN:  [id number]     Date/Time:  12/08/2021 11:50 AM    Patient PCP: None, None  ________________________________________________________________________     Assessment/Plan:   Chest pain, sharp, left-sided, negative cardiac enzymes, will repeat EKG, obtain echo.    Abdominal pain, elevated lipase, nausea vomiting diarrhea/pancreatitis/alcohol use    Hypertension, diabetes, dyslipidemia    Family history of heart disease             '[]'         High complexity decision making was performed        Subjective:   CHIEF COMPLAINT:   Chest pain    REASON FOR CONSULT:  Chest pain    HISTORY OF PRESENT ILLNESS:     Antonio CORP Sr. is a 61 y.o. Black / [place] American male who has a history of hypertension diabetes dyslipidemia who had been drinking and comes in with chest pain.  Also with nausea vomiting diarrhea and abdominal pain.  With elevated lipase suggestive of acute pancreatitis.  Continues to have sharp pains.  Will repeat EKG, repeat troponin, obtain echo.        Past Medical History:   Diagnosis Date    Depression     Diabetes ([facility])     ETOH abuse     High cholesterol     Hypertension       Past Surgical History:   Procedure Laterality Date    CHOLECYSTECTOMY      ROTATOR CUFF REPAIR Right      No Known Allergies   Meds:  See below  Social History     Tobacco Use    Smoking status: Never    Smokeless tobacco: Never   Substance Use Topics    Alcohol use: Yes     Alcohol/week: 74.0 standard drinks of alcohol     Types: 74 Standard drinks or equivalent per week      Family History   Problem Relation Age of Onset    Hypertension Mother        REVIEW OF SYSTEMS:     '[]'          Unable to obtain  ROS due to ---   '[x]'          Total of 12 systems reviewed as follows:    Constitutional: negative fever, negative chills, negative weight loss  Eyes:   negative visual changes  ENT:   negative sore throat, tongue or lip swelling  Respiratory:  negative  cough, negative dyspnea  Cards:                       See HPI  GI:   negative for nausea, vomiting, diarrhea, and abdominal pain  Genitourinary: negative for frequency, dysuria  Integument:  negative for rash   Hematologic:  negative for easy bruising and gum/nose bleeding  Musculoskel: negative for myalgias,  back pain  Neurological:  negative for headaches, dizziness, vertigo, weakness  Behavl/Psych: negative for feelings of anxiety, depression     Pertinent Positives include :    Objective:      Physical Exam:    Last 24hrs VS reviewed since prior progress note. Most recent are:    BP (!) 156/97   Pulse 99   Temp 98.1 F (36.7 C) (Oral)   Resp 18   Ht 1.753 m ('5\' 9"' )   Wt 88.5 kg (  195 lb)   SpO2 97%   BMI 28.80 kg/m     Intake/Output Summary (Last 24 hours) at 12/08/2021 1150  Last data filed at 12/08/2021 0553  Gross per 24 hour   Intake 600 ml   Output 1000 ml   Net -400 ml        General Appearance: Well developed, sleepy, no acute distress.  Ears/Nose/Mouth/Throat: Pupils equal and round, Hearing grossly normal.  Neck: Supple.  JVP within normal limits. Carotids good upstrokes, with no bruit.  Chest: Lungs clear to auscultation bilaterally.  Cardiovascular: Regular rate and rhythm, S1S2 normal, no murmur, rubs, gallops.  Abdomen: Soft, mildly tender, bowel sounds are active. No organomegaly.  Extremities: No edema bilaterally. Femoral pulses +2, Distal Pulses +1.  Skin: Warm and dry.  Neuro: Sleepy   psychiatric: Cooperative, normal affect and judgment    '[]'          Post-cath site without hematoma, bruit, tenderness, or thrill.  Distal pulses intact.    Data:      Telemetry:    EKG:  '[]'   No new EKG for review.    Chest x-ray without acute process  EKG with sinus tachycardia, nonspecific ST-T changes    Prior to Admission medications    Medication Sig Start Date End Date Taking? Authorizing Provider   ondansetron (ZOFRAN-ODT) 4 MG disintegrating tablet Take 1 tablet by mouth 3 times daily as needed  for Nausea or Vomiting 09/02/21   Levada Schilling, MD   amLODIPine (NORVASC) 10 MG tablet Take 1 tablet by mouth daily 08/26/20   Automatic Reconciliation, Ar   Insulin NPH Isophane & Regular (NOVOLIN 70/30 FLEXPEN) (70-30) 100 UNIT per ML injection pen Inject 48 units qAM, 28 units qhs 08/02/20   Automatic Reconciliation, Ar   insulin regular (HUMULIN R;NOVOLIN R) 100 UNIT/ML injection Inject into the skin    Automatic Reconciliation, Ar   lisinopril (PRINIVIL;ZESTRIL) 40 MG tablet Take 1 tablet by mouth 01/18/21   Automatic Reconciliation, Ar   metFORMIN (GLUCOPHAGE-XR) 500 MG extended release tablet Take 1 tablet by mouth daily 07/14/20   Automatic Reconciliation, Ar   omeprazole (PRILOSEC) 40 MG delayed release capsule Take 1 capsule by mouth daily    Automatic Reconciliation, Ar   sucralfate (CARAFATE) 1 GM tablet Take 1 tablet by mouth 4 times daily (before meals and nightly) 08/05/20   Automatic Reconciliation, Ar       Recent Results (from the past 24 hour(s))   EKG 12 Lead    Collection Time: 12/08/21 12:53 AM   Result Value Ref Range    Ventricular Rate 122 BPM    Atrial Rate 122 BPM    P-R Interval 162 ms    QRS Duration 70 ms    Q-T Interval 318 ms    QTc Calculation (Bazett) 453 ms    P Axis 59 degrees    R Axis 27 degrees    T Axis 40 degrees    Diagnosis       Sinus tachycardia  Possible Inferior infarct , age undetermined  Abnormal ECG  When compared with ECG of 02-Sep-2021 04:16,  Borderline criteria for Inferior infarct are now Present  Nonspecific T wave abnormality now evident in Inferior leads  Nonspecific T wave abnormality now evident in Anterior leads  Confirmed by Rayetta Pigg [id number]) on 12/08/2021 9:23:40 AM     Basic Metabolic Panel    Collection Time: 12/08/21  1:00 AM   Result Value Ref Range  Sodium 132 (L) 136 - 145 mmol/L    Potassium 4.1 3.5 - 5.1 mmol/L    Chloride 94 (L) 97 - 108 mmol/L    CO2 26 21 - 32 mmol/L    Anion Gap 12 5 - 15 mmol/L    Glucose 492 (H) 65 - 100 mg/dL    BUN 13 6 - 20  mg/dL    Creatinine 1.56 (H) 0.70 - 1.30 mg/dL    Bun/Cre Ratio 8 (L) 12 - 20      Est, Glom Filt Rate 51 (L) >60 ml/min/1.41m    Calcium 8.6 8.5 - 10.1 mg/dL   CBC with Auto Differential    Collection Time: 12/08/21  1:00 AM   Result Value Ref Range    WBC 4.5 4.1 - 11.1 K/uL    RBC 4.61 4.10 - 5.70 M/uL    Hemoglobin 11.5 (L) 12.1 - 17.0 g/dL    Hematocrit 34.9 (L) 36.6 - 50.3 %    MCV 75.7 (L) 80.0 - 99.0 FL    MCH 24.9 (L) 26.0 - 34.0 PG    MCHC 33.0 30.0 - 36.5 g/dL    RDW 21.6 (H) 11.5 - 14.5 %    Platelets 243 150 - 400 K/uL    MPV 10.3 8.9 - 12.9 FL    Neutrophils % 49 32 - 75 %    Lymphocytes % 40 12 - 49 %    Monocytes % 10 5 - 13 %    Eosinophils % 1 0 - 7 %    Basophils % 0 0 - 1 %    Immature Granulocytes 0 0.0 - 0.5 %    Neutrophils Absolute 2.2 1.8 - 8.0 K/UL    Lymphocytes Absolute 1.8 0.8 - 3.5 K/UL    Monocytes Absolute 0.5 0.0 - 1.0 K/UL    Eosinophils Absolute 0.1 0.0 - 0.4 K/UL    Basophils Absolute 0.0 0.0 - 0.1 K/UL    Absolute Immature Granulocyte 0.0 0.00 - 0.04 K/UL    Differential Type AUTOMATED     Brain Natriuretic Peptide    Collection Time: 12/08/21  1:00 AM   Result Value Ref Range    NT Pro-BNP 46 <125 pg/mL   Troponin    Collection Time: 12/08/21  1:00 AM   Result Value Ref Range    Troponin, High Sensitivity 6 0 - 76 ng/L   APTT    Collection Time: 12/08/21  1:00 AM   Result Value Ref Range    PTT 20.4 (L) 22.1 - 31.0 sec    Therapeutic Range   82 - 109 sec   Protime-INR    Collection Time: 12/08/21  1:00 AM   Result Value Ref Range    Protime 9.4 9.0 - 11.1 sec    INR 1.0 0.9 - 1.1     Hepatic Function Panel    Collection Time: 12/08/21  1:00 AM   Result Value Ref Range    Total Protein 7.2 6.4 - 8.2 g/dL    Albumin 3.4 (L) 3.5 - 5.0 g/dL    Globulin 3.8 2.0 - 4.0 g/dL    Albumin/Globulin Ratio 0.9 (L) 1.1 - 2.2      Total Bilirubin 0.3 0.2 - 1.0 mg/dL    Bilirubin, Direct 0.2 0.0 - 0.2 mg/dL    Alk Phosphatase 172 (H) 45 - 117 U/L    AST 185 (H) 15 - 37 U/L    ALT 163 (H) 12 -  78 U/L  ETOH    Collection Time: 12/08/21  1:22 AM   Result Value Ref Range    Ethanol Lvl 297 (H) <10 mg/dL   Lipase    Collection Time: 12/08/21  1:22 AM   Result Value Ref Range    Lipase 491 (H) 13 - 75 U/L   POCT Glucose    Collection Time: 12/08/21  8:26 AM   Result Value Ref Range    POC Glucose 251 (H) 65 - 100 mg/dL    Performed by: Newell Coral    POCT Glucose    Collection Time: 12/08/21 11:40 AM   Result Value Ref Range    POC Glucose 248 (H) 65 - 100 mg/dL    Performed by: Vickii Chafe, MD

## 2021-12-08 NOTE — Progress Notes (Signed)
Patient arrived from [facility], alert and oriented x4, CIWA protocol, also had chest pain and persistent hiccoughs.

## 2021-12-08 NOTE — ED Triage Notes (Signed)
Pt states chest pain "hard to take a breath" onset 1700 today, "had the hiccoughs" midsternal non-radiating

## 2021-12-08 NOTE — Care Coordination-Inpatient (Signed)
12/08/21 1103   Service Assessment   Patient Orientation Alert and Oriented   Cognition Alert   History Provided By Patient;Friend   Primary Caregiver Self   Accompanied By/Relationship Patient's friend, Foy [place], at bedside.   Support Systems Children;Friends/Neighbors   Patient's Healthcare Decision Maker is: Patient Declined Theme park manager Next of Kin Remains as Media planner)   PCP Verified by CM No   Prior Functional Level Independent in ADLs/IADLs   Current Functional Level Independent in ADLs/IADLs   Can patient return to prior living arrangement Yes   Ability to make needs known: Good   Family able to assist with home care needs: Yes   Would you like for me to discuss the discharge plan with any other family members/significant others, and if so, who? No   Airline pilot None   Social/Functional History   Lives With Friend(s)   Type of [place] One level   Home Access Stairs to enter without rails   Entrance Stairs - Number of Steps 2   Active Driver Yes   Mode of Scientist, product/process development   Discharge Planning   Type of [place] Discharge   Confirm Follow Up Transport Friends       Patient lives with his friend in a single story house accessible by two steps. He ambulates independently and completes ADLs without assistance. Prior to admission patient was going to outpatient therapy, and plans to continue after discharge. Current discharge disposition is home, no needs. Patient's friend will pick him up at time of discharge.

## 2021-12-08 NOTE — ED Notes (Signed)
Warm blankets given to patient, pt encouraged to function independently     Riggins Cisek H, RN  12/08/21 0205

## 2021-12-08 NOTE — Plan of Care (Signed)
Problem: Discharge Planning  Goal: Discharge to home or other facility with appropriate resources  Outcome: Progressing  Flowsheets (Taken 12/08/2021 0506)  Discharge to home or other facility with appropriate resources:   Identify barriers to discharge with patient and caregiver   Arrange for needed discharge resources and transportation as appropriate   Identify discharge learning needs (meds, wound care, etc)

## 2021-12-08 NOTE — Progress Notes (Shared)
4 Eyes Skin Assessment     NAME:  Antonio ALEN Sr.  DATE OF BIRTH:  April 23, 1960  MEDICAL RECORD NUMBER:  [id number]    The patient is being assessed for  {Reason for Assessment:61715}    I agree that at least one RN has performed a thorough Head to Toe Skin Assessment on the patient. ALL assessment sites listed below have been assessed.      Areas assessed by both nurses:    Head, Face, Ears, Shoulders, Back, Chest, Arms, Elbows, Hands, Sacrum. Buttock, Coccyx, Ischium, Legs. Feet and Heels, and Under Medical Devices         Does the Patient have a Wound? No noted wound(s)       Braden Prevention initiated by RN: No  Wound Care Orders initiated by RN: No    Pressure Injury (Stage 3,4, Unstageable, DTI, NWPT, and Complex wounds) if present, place Wound referral order by RN under ORDER ENTRY: No    New Ostomies, if present place, Ostomy referral order under ORDER ENTRY: No     Nurse 1 eSignature: Electronically signed by Kimberlee Nearing, RN on 12/08/21 at 4:54 AM EDT    **SHARE this note so that the co-signing nurse can place an eSignature**    Nurse 2 eSignature: {Esignature:[id number]}

## 2021-12-09 LAB — POCT GLUCOSE
POC Glucose: 164 mg/dL — ABNORMAL HIGH (ref 65–100)
POC Glucose: 170 mg/dL — ABNORMAL HIGH (ref 65–100)
POC Glucose: 202 mg/dL — ABNORMAL HIGH (ref 65–100)
POC Glucose: 231 mg/dL — ABNORMAL HIGH (ref 65–100)

## 2021-12-09 LAB — COMPREHENSIVE METABOLIC PANEL
ALT: 103 U/L — ABNORMAL HIGH (ref 12–78)
AST: 56 U/L — ABNORMAL HIGH (ref 15–37)
Albumin/Globulin Ratio: 0.9 — ABNORMAL LOW (ref 1.1–2.2)
Albumin: 2.9 g/dL — ABNORMAL LOW (ref 3.5–5.0)
Alk Phosphatase: 115 U/L (ref 45–117)
Anion Gap: 7 mmol/L (ref 5–15)
BUN: 6 mg/dL (ref 6–20)
Bun/Cre Ratio: 6 — ABNORMAL LOW (ref 12–20)
CO2: 24 mmol/L (ref 21–32)
Calcium: 8.2 mg/dL — ABNORMAL LOW (ref 8.5–10.1)
Chloride: 101 mmol/L (ref 97–108)
Creatinine: 1 mg/dL (ref 0.70–1.30)
Est, Glom Filt Rate: >60 mL/min/{1.73_m2} (ref >60)
Globulin: 3.3 g/dL (ref 2.0–4.0)
Glucose: 239 mg/dL — ABNORMAL HIGH (ref 65–100)
Potassium: 3.1 mmol/L — ABNORMAL LOW (ref 3.5–5.1)
Sodium: 132 mmol/L — ABNORMAL LOW (ref 136–145)
Total Bilirubin: 0.6 mg/dL (ref 0.2–1.0)
Total Protein: 6.2 g/dL — ABNORMAL LOW (ref 6.4–8.2)

## 2021-12-09 LAB — CBC WITH AUTO DIFFERENTIAL
Absolute Immature Granulocyte: 0 10*3/uL (ref 0.00–0.04)
Basophils %: 1 % (ref 0–1)
Basophils Absolute: 0 10*3/uL (ref 0.0–0.1)
Eosinophils %: 2 % (ref 0–7)
Eosinophils Absolute: 0.1 10*3/uL (ref 0.0–0.4)
Hematocrit: 31 % — ABNORMAL LOW (ref 36.6–50.3)
Hemoglobin: 10.2 g/dL — ABNORMAL LOW (ref 12.1–17.0)
Immature Granulocytes: 0 % (ref 0–0.5)
Lymphocytes %: 32 % (ref 12–49)
Lymphocytes Absolute: 1.3 10*3/uL (ref 0.8–3.5)
MCH: 24.2 PG — ABNORMAL LOW (ref 26.0–34.0)
MCHC: 32.9 g/dL (ref 30.0–36.5)
MCV: 73.6 FL — ABNORMAL LOW (ref 80.0–99.0)
MPV: 10.5 FL (ref 8.9–12.9)
Monocytes %: 9 % (ref 5–13)
Monocytes Absolute: 0.4 10*3/uL (ref 0.0–1.0)
Neutrophils %: 56 % (ref 32–75)
Neutrophils Absolute: 2.3 10*3/uL (ref 1.8–8.0)
Nucleated RBCs: 0 PER 100 WBC
Platelets: 170 10*3/uL (ref 150–400)
RBC: 4.21 M/uL (ref 4.10–5.70)
RDW: 21 % — ABNORMAL HIGH (ref 11.5–14.5)
WBC: 4 10*3/uL — ABNORMAL LOW (ref 4.1–11.1)
nRBC: 0 10*3/uL (ref 0.00–0.01)

## 2021-12-09 LAB — HEMOGLOBIN A1C
Hemoglobin A1C: 12.9 % — ABNORMAL HIGH (ref 4.0–5.6)
eAG: 324 mg/dL

## 2021-12-09 LAB — PHOSPHORUS: Phosphorus: 2.6 mg/dL (ref 2.6–4.7)

## 2021-12-09 LAB — LIPASE: Lipase: 399 U/L — ABNORMAL HIGH (ref 13–75)

## 2021-12-09 LAB — MAGNESIUM: Magnesium: 1.7 mg/dL (ref 1.6–2.4)

## 2021-12-09 MED ORDER — ACETAMINOPHEN 325 MG PO TABS
325 MG | ORAL | Status: DC | PRN
Start: 2021-12-09 — End: 2021-12-10
  Administered 2021-12-09 – 2021-12-10 (×3): 650 mg via ORAL

## 2021-12-09 MED ORDER — MAGNESIUM SULFATE IN D5W 1-5 GM/100ML-% IV SOLN
1-5 GM/100ML-% | INTRAVENOUS | Status: AC
Start: 2021-12-09 — End: 2021-12-09
  Administered 2021-12-09: 16:00:00 1000 mg via INTRAVENOUS

## 2021-12-09 MED ORDER — POTASSIUM CHLORIDE CRYS ER 20 MEQ PO TBCR
20 MEQ | ORAL | Status: AC
Start: 2021-12-09 — End: 2021-12-09
  Administered 2021-12-09: 16:00:00 40 meq via ORAL

## 2021-12-09 MED ORDER — CHLORPROMAZINE HCL 25 MG/ML IJ SOLN
25 MG/ML | Freq: Three times a day (TID) | INTRAMUSCULAR | Status: DC | PRN
Start: 2021-12-09 — End: 2021-12-10
  Administered 2021-12-09: 03:00:00 25 mg via INTRAVENOUS

## 2021-12-09 MED FILL — METOPROLOL TARTRATE 25 MG PO TABS: 25 MG | ORAL | Qty: 1

## 2021-12-09 MED FILL — LORAZEPAM 1 MG PO TABS: 1 MG | ORAL | Qty: 2

## 2021-12-09 MED FILL — INSULIN LISPRO 100 UNIT/ML IJ SOLN: 100 UNIT/ML | INTRAMUSCULAR | Qty: 7

## 2021-12-09 MED FILL — CHLORPROMAZINE HCL 50 MG/2ML IJ SOLN: 50 MG/2ML | INTRAMUSCULAR | Qty: 1

## 2021-12-09 MED FILL — MAGNESIUM SULFATE IN D5W 1-5 GM/100ML-% IV SOLN: 1-5 GM/100ML-% | INTRAVENOUS | Qty: 100

## 2021-12-09 MED FILL — LISINOPRIL 10 MG PO TABS: 10 MG | ORAL | Qty: 4

## 2021-12-09 MED FILL — LORAZEPAM 1 MG PO TABS: 1 MG | ORAL | Qty: 1

## 2021-12-09 MED FILL — ONCE DAILY PO TABS: ORAL | Qty: 1

## 2021-12-09 MED FILL — THIAMINE HCL 100 MG/ML IJ SOLN: 100 MG/ML | INTRAMUSCULAR | Qty: 1

## 2021-12-09 MED FILL — INSULIN GLARGINE 100 UNIT/ML SC SOLN: 100 UNIT/ML | SUBCUTANEOUS | Qty: 43

## 2021-12-09 MED FILL — TYLENOL 325 MG PO TABS: 325 MG | ORAL | Qty: 2

## 2021-12-09 MED FILL — AMLODIPINE BESYLATE 5 MG PO TABS: 5 MG | ORAL | Qty: 2

## 2021-12-09 MED FILL — ENOXAPARIN SODIUM 40 MG/0.4ML IJ SOSY: 40 MG/0.4ML | INTRAMUSCULAR | Qty: 0.4

## 2021-12-09 MED FILL — POTASSIUM CHLORIDE CRYS ER 20 MEQ PO TBCR: 20 MEQ | ORAL | Qty: 2

## 2021-12-09 MED FILL — THIAMINE HCL 200 MG/2ML IJ SOLN: 200 MG/2ML | INTRAMUSCULAR | Qty: 1

## 2021-12-09 MED FILL — PANTOPRAZOLE SODIUM 40 MG PO TBEC: 40 MG | ORAL | Qty: 1

## 2021-12-09 MED FILL — INSULIN LISPRO 100 UNIT/ML IJ SOLN: 100 UNIT/ML | INTRAMUSCULAR | Qty: 2

## 2021-12-09 NOTE — Progress Notes (Signed)
Hospitalist Progress Note    NAME:   Antonio RIDLON Sr.   DOB: 09/01/1960   MRN: [id number]     Date/Time: 12/09/2021 8:06 AM  Patient PCP: None, None    Estimated discharge date:  Antonio Melendez:       Assessment / Plan:  Acute alcoholic pancreatitis  ETOH abuse  Elevated liver enzymes   He has etoh abuse hx and lipase is elevated suggestive of acute alcoholic pancreatitis but no abdominal pain but has nausea/vomiting.  CIWA protocol.  Ativan prn.  No evidence of alcohol withdrawal symptoms currently  10/13: He has been tachycardic with heart rate between 108-120.  Also systolic blood pressure is between 130-170. Adding metoprolol 25 mg bid.  10/14: Pain improved this morning.  No nausea or vomiting.  Sodium dropped down elevated to 132.  Advance diet as tolerated.  If patient continues to feel better, will plan for discharge tomorrow.    Hypokalemia  10/14: Potassium of 3.1 and will give 80 KCl.  Magnesium 1.7 and will give 2 g of IV magnesium sulfate.    Diabetes mellitus type 2  Hyperglycemia  Diabetic neuropathy  Non compliance  Blood glucoses in the 400s but no evidence of DKA.  Resume home dose of insulin along with sliding scale and get HbA1c.  Hold metformin.  10/14: 239, 164.    Chest pain  R/O ACS with serial troponins and EKG. TTE. Cardiology consult.  10/14: Appreciate cardiology input.  Patient's chest pain is related to pancreatitis.    troponin was initially 7 that trended down to 6.  .  Echocardiogram shows EF of 55%, posterior LV wall thickening.    HTN  Hyperlipidemia  10/13: He has been tachycardic with heart rate between 108-120.  Also systolic blood pressure is between 130-170. Adding metoprolol 25 mg bid.  10/14:         Medical Decision Making:   I personally reviewed labs: CBC, BMP, LFT, magnesium  I personally reviewed imaging:  I personally reviewed EKG:  Toxic drug monitoring:   Discussed case with: Patient, RN        Code Status:  full code  DVT Prophylaxis: SQ heparin  GI  Prophylaxis:    Subjective:     Chief Complaint / Reason for Physician Visit  "patient is a 61 year old male with chronic alcohol abuse, drinks something like vodka half a handle every day and is presenting with acute alcoholic pancreatitis, elevated LFT.".  Discussed with RN events overnight.       Objective:     VITALS:   Last 24hrs VS reviewed since prior progress note. Most recent are:  Patient Vitals for the past 24 hrs:   BP Temp Temp src Pulse Resp SpO2   12/09/21 0754 132/68 -- -- 88 -- --   12/09/21 0715 126/80 99.2 F (37.3 C) Oral 96 14 97 %   12/09/21 0308 133/81 98.2 F (36.8 C) Oral 98 14 95 %   12/08/21 2315 (!) 149/88 98 F (36.7 C) Oral 97 20 99 %   12/08/21 2153 (!) 143/98 -- -- 98 -- --   12/08/21 1943 (!) 143/98 98.8 F (37.1 C) Oral 98 22 99 %   12/08/21 1547 -- -- -- 92 -- --   12/08/21 1136 -- -- -- 99 -- --   12/08/21 1039 (!) 156/97 98.1 F (36.7 C) Oral (!) 108 18 --   12/08/21 0830 (!) 156/97 98.1 F (36.7 C) Oral (!) 108 18 97 %  Intake/Output Summary (Last 24 hours) at 12/09/2021 0806  Last data filed at 12/09/2021 0153  Gross per 24 hour   Intake 300 ml   Output 1575 ml   Net -1275 ml        I had a face to face encounter and independently examined this patient on 12/09/2021, as outlined below:  PHYSICAL EXAM:  General: Alert, cooperative  EENT:  EOMI. Anicteric sclerae.  Resp:  CTA bilaterally, no wheezing or rales.  No accessory muscle use  CV:  Regular  rhythm,  No edema  GI:  Soft, Non distended, Non tender.  +Bowel sounds  Neurologic:  Alert and oriented X 3, normal speech,   Psych:   Good insight. Not anxious nor agitated  Skin:  No rashes.  No jaundice    Reviewed most current lab test results and cultures  YES  Reviewed most current radiology test results   YES  Review and summation of old records today    NO  Reviewed patient's current orders and MAR    YES  PMH/SH reviewed - no change compared to  H&P  ________________________________________________________________________  Care Plan discussed with:    Comments   Patient x    Family      RN x    Care Manager     Consultant                        Multidiciplinary team rounds were held today with case manager, nursing, pharmacist and Occupational psychologist.  Patient's plan of care was discussed; medications were reviewed and discharge planning was addressed.     ________________________________________________________________________  Total NON critical care TIME:  35  Minutes    Total CRITICAL CARE TIME Spent:   Minutes non procedure based      Comments   >50% of visit spent in counseling and coordination of care     ________________________________________________________________________  Park Liter, MD     Procedures: see electronic medical records for all procedures/Xrays and details which were not copied into this note but were reviewed prior to creation of Plan.      LABS:  I reviewed today's most current labs and imaging studies.  Pertinent labs include:  Recent Labs     12/08/21  0100 12/09/21  0255   WBC 4.5 4.0*   HGB 11.5* 10.2*   HCT 34.9* 31.0*   PLT 243 170     Recent Labs     12/08/21  0100 12/09/21  0255   NA 132* 132*   K 4.1 3.1*   CL 94* 101   CO2 26 24   GLUCOSE 492* 239*   BUN 13 6   CREATININE 1.56* 1.00   CALCIUM 8.6 8.2*   MG  --  1.7   PHOS  --  2.6   LABALBU 3.4* 2.9*   BILITOT 0.3 0.6   AST 185* 56*   ALT 163* 103*   INR 1.0  --        Signed: Park Liter, MD

## 2021-12-09 NOTE — Progress Notes (Signed)
61 year old male admitted for acute chest pain and persistent hiccups 10/13. Pt was given thorazine by night nurse to help hiccups and has been drowsy most of the day.   CXR neg  Ct head- neg  EKG- EF 50-55%  PMH includes:depression, diabetes, htn, hyperlipidemia, alcohol abuse.

## 2021-12-10 LAB — COMPREHENSIVE METABOLIC PANEL
ALT: 79 U/L — ABNORMAL HIGH (ref 12–78)
AST: 37 U/L (ref 15–37)
Albumin/Globulin Ratio: 0.8 — ABNORMAL LOW (ref 1.1–2.2)
Albumin: 2.8 g/dL — ABNORMAL LOW (ref 3.5–5.0)
Alk Phosphatase: 108 U/L (ref 45–117)
Anion Gap: 7 mmol/L (ref 5–15)
BUN: 6 mg/dL (ref 6–20)
Bun/Cre Ratio: 6 — ABNORMAL LOW (ref 12–20)
CO2: 24 mmol/L (ref 21–32)
Calcium: 8.5 mg/dL (ref 8.5–10.1)
Chloride: 107 mmol/L (ref 97–108)
Creatinine: 0.93 mg/dL (ref 0.70–1.30)
Est, Glom Filt Rate: >60 mL/min/{1.73_m2} (ref >60)
Globulin: 3.4 g/dL (ref 2.0–4.0)
Glucose: 180 mg/dL — ABNORMAL HIGH (ref 65–100)
Potassium: 3.3 mmol/L — ABNORMAL LOW (ref 3.5–5.1)
Sodium: 138 mmol/L (ref 136–145)
Total Bilirubin: 0.4 mg/dL (ref 0.2–1.0)
Total Protein: 6.2 g/dL — ABNORMAL LOW (ref 6.4–8.2)

## 2021-12-10 LAB — CBC
Hematocrit: 32.5 % — ABNORMAL LOW (ref 36.6–50.3)
Hemoglobin: 10.6 g/dL — ABNORMAL LOW (ref 12.1–17.0)
MCH: 24.3 PG — ABNORMAL LOW (ref 26.0–34.0)
MCHC: 32.6 g/dL (ref 30.0–36.5)
MCV: 74.5 FL — ABNORMAL LOW (ref 80.0–99.0)
MPV: 10.5 FL (ref 8.9–12.9)
Nucleated RBCs: 0 PER 100 WBC
Platelets: 185 10*3/uL (ref 150–400)
RBC: 4.36 M/uL (ref 4.10–5.70)
RDW: 21.1 % — ABNORMAL HIGH (ref 11.5–14.5)
WBC: 3.7 10*3/uL — ABNORMAL LOW (ref 4.1–11.1)
nRBC: 0 10*3/uL (ref 0.00–0.01)

## 2021-12-10 LAB — POCT GLUCOSE
POC Glucose: 122 mg/dL — ABNORMAL HIGH (ref 65–100)
POC Glucose: 125 mg/dL — ABNORMAL HIGH (ref 65–100)
POC Glucose: 185 mg/dL — ABNORMAL HIGH (ref 65–100)

## 2021-12-10 LAB — MAGNESIUM: Magnesium: 1.7 mg/dL (ref 1.6–2.4)

## 2021-12-10 MED ORDER — POTASSIUM CHLORIDE CRYS ER 20 MEQ PO TBCR
20 MEQ | ORAL | Status: AC
Start: 2021-12-10 — End: 2021-12-10
  Administered 2021-12-10 (×2): 40 meq via ORAL

## 2021-12-10 MED ORDER — MAGNESIUM SULFATE IN D5W 1-5 GM/100ML-% IV SOLN
1-5 GM/100ML-% | INTRAVENOUS | Status: AC
Start: 2021-12-10 — End: 2021-12-10
  Administered 2021-12-10 (×2): 1000 mg via INTRAVENOUS

## 2021-12-10 MED ORDER — METOPROLOL TARTRATE 25 MG PO TABS
25 MG | ORAL_TABLET | Freq: Two times a day (BID) | ORAL | 0 refills | Status: AC
Start: 2021-12-10 — End: ?

## 2021-12-10 MED ORDER — TRAMADOL HCL 50 MG PO TABS
50 MG | Freq: Once | ORAL | Status: AC
Start: 2021-12-10 — End: 2021-12-09
  Administered 2021-12-10: 04:00:00 50 mg via ORAL

## 2021-12-10 MED FILL — METOPROLOL TARTRATE 25 MG PO TABS: 25 MG | ORAL | Qty: 1

## 2021-12-10 MED FILL — LORAZEPAM 1 MG PO TABS: 1 MG | ORAL | Qty: 1

## 2021-12-10 MED FILL — ONCE DAILY PO TABS: ORAL | Qty: 1

## 2021-12-10 MED FILL — MAGNESIUM SULFATE IN D5W 1-5 GM/100ML-% IV SOLN: 1-5 GM/100ML-% | INTRAVENOUS | Qty: 100

## 2021-12-10 MED FILL — INSULIN LISPRO 100 UNIT/ML IJ SOLN: 100 UNIT/ML | INTRAMUSCULAR | Qty: 7

## 2021-12-10 MED FILL — LISINOPRIL 10 MG PO TABS: 10 MG | ORAL | Qty: 4

## 2021-12-10 MED FILL — AMLODIPINE BESYLATE 5 MG PO TABS: 5 MG | ORAL | Qty: 2

## 2021-12-10 MED FILL — PANTOPRAZOLE SODIUM 40 MG PO TBEC: 40 MG | ORAL | Qty: 1

## 2021-12-10 MED FILL — INSULIN GLARGINE 100 UNIT/ML SC SOLN: 100 UNIT/ML | SUBCUTANEOUS | Qty: 43

## 2021-12-10 MED FILL — TYLENOL 325 MG PO TABS: 325 MG | ORAL | Qty: 2

## 2021-12-10 MED FILL — POTASSIUM CHLORIDE CRYS ER 20 MEQ PO TBCR: 20 MEQ | ORAL | Qty: 2

## 2021-12-10 MED FILL — LANTUS 100 UNIT/ML SC SOLN: 100 UNIT/ML | SUBCUTANEOUS | Qty: 13

## 2021-12-10 MED FILL — LORAZEPAM 1 MG PO TABS: 1 MG | ORAL | Qty: 2

## 2021-12-10 MED FILL — ENOXAPARIN SODIUM 40 MG/0.4ML IJ SOSY: 40 MG/0.4ML | INTRAMUSCULAR | Qty: 0.4

## 2021-12-10 MED FILL — THIAMINE HCL 200 MG/2ML IJ SOLN: 200 MG/2ML | INTRAMUSCULAR | Qty: 1

## 2021-12-10 MED FILL — TRAMADOL HCL 50 MG PO TABS: 50 MG | ORAL | Qty: 1

## 2021-12-10 NOTE — Discharge Summary (Signed)
Discharge Summary    Name: Antonio Melendez  [id number]  Date of Birth: [birth year] (Age: 61 y.o.)   Date of Admission: 12/08/2021  Date of Discharge: 12/10/2021  Attending Physician: Park Liter, MD    Discharge Diagnosis:   Acute alcoholic pancreatitis  ETOH abuse  Elevated liver enzymes   Hypokalemia  Diabetes mellitus type 2  Hyperglycemia  Diabetic neuropathy  Non compliance  Chest pain  HTN  Hyperlipidemia        Consultations:  IP CONSULT TO SOCIAL WORK  IP CONSULT TO CARDIOLOGY      Brief Admission History/Reason for Admission Per Olen Cordial, MD: The patient is a 61 y.o. male presented to emergency department with chest pain that started few days ago, off-and-on, on the left side and sharp in nature.  He has some nausea and vomited twice today.  Denies abdominal pain no diarrhea or constipation.  No shortness of breath.  He does have history of alcohol abuse and for the last 3 days he is drinking almost on daily basis.  Denies history of coronary artery disease.           Brief Hospital Course by Main Problems:   Acute alcoholic pancreatitis  ETOH abuse  Elevated liver enzymes   He has etoh abuse hx and lipase is elevated suggestive of acute alcoholic pancreatitis but no abdominal pain but has nausea/vomiting.  CIWA protocol.  Ativan prn.  No evidence of alcohol withdrawal symptoms currently  10/13: He has been tachycardic with heart rate between 108-120.  Also systolic blood pressure is between 130-170. Adding metoprolol 25 mg bid.  10/14: Pain improved this morning.  No nausea or vomiting.  Sodium dropped down elevated to 132.  Advance diet as tolerated.  If patient continues to feel better, will plan for discharge tomorrow.  10/15: No abdominal pain, tolerating diet, no nausea. Potassium  and magnesium repleted. Discharge patient home. Counselled about alcohol cessation. F/u PCP in 1 week.     Hypokalemia  10/14: Potassium of 3.1 and will give 80 KCl.  Magnesium  1.7 and will give 2 g of IV magnesium sulfate.     Diabetes mellitus type 2  Hyperglycemia  Diabetic neuropathy  Non compliance  Blood glucoses in the 400s but no evidence of DKA.  Resume home dose of insulin along with sliding scale and get HbA1c.  Hold metformin.  10/14: 239, 164.     Chest pain  R/O ACS with serial troponins and EKG. TTE. Cardiology consult.  10/14: Appreciate cardiology input.  Patient's chest pain is related to pancreatitis.    troponin was initially 7 that trended down to 6.  .  Echocardiogram shows EF of 55%, posterior LV wall thickening.     HTN  Hyperlipidemia  10/13: He has been tachycardic with heart rate between 108-120.  Also systolic blood pressure is between 130-170. Adding metoprolol 25 mg bid.  10/15: Discharge patient home on amlodipine 10 mg daily and metoprolol 25 mg bid.     Discharge Exam:  Patient seen and examined by me on discharge day.  Pertinent Findings:  Patient Vitals for the past 24 hrs:   BP Temp Temp src Pulse Resp SpO2   12/10/21 0742 (!) 138/100 98.1 F (36.7 C) Oral 87 18 100 %   12/10/21 0257 (!) 144/90 98.1 F (36.7 C) Oral 94 18 100 %   12/10/21 0008 -- -- -- -- 15 --   12/09/21 2338 -- -- -- -- 12 --  12/09/21 2333 (!) 147/95 98.1 F (36.7 C) Oral 90 16 100 %   12/09/21 1950 138/86 98.1 F (36.7 C) -- 96 14 99 %   12/09/21 1515 112/68 98.6 F (37 C) Oral 97 16 100 %   12/09/21 1450 132/75 -- -- 84 -- --   12/09/21 1115 130/87 98.1 F (36.7 C) Oral 96 14 98 %       General:          Alert, cooperative  EENT:              EOMI. Anicteric sclerae.  Resp:               CTA bilaterally, no wheezing or rales.  No accessory muscle use  CV:                  Regular  rhythm,  No edema  GI:                   Soft, Non distended, Non tender.  +Bowel sounds  Neurologic:       Alert and oriented X 3, normal speech,   Psych:   Good insight. Not anxious nor agitated  Skin:                No rashes.  No jaundice    Discharge/Recent Laboratory Results:  Recent Labs      12/09/21  0255 12/10/21  0807   NA 132* 138   K 3.1* 3.3*   CL 101 107   CO2 24 24   BUN 6 6   CREATININE 1.00 0.93   GLUCOSE 239* 180*   CALCIUM 8.2* 8.5   PHOS 2.6  --    MG 1.7 1.7     Recent Labs     12/10/21  0807   HGB 10.6*   HCT 32.5*   WBC 3.7*   PLT 185       Discharge Medications:     Medication List        START taking these medications      metoprolol tartrate 25 MG tablet  Commonly known as: LOPRESSOR  Take 1 tablet by mouth 2 times daily            CONTINUE taking these medications      amLODIPine 10 MG tablet  Commonly known as: NORVASC     insulin regular 100 UNIT/ML injection  Commonly known as: HUMULIN R;NOVOLIN R     lisinopril 40 MG tablet  Commonly known as: PRINIVIL;ZESTRIL     metFORMIN 500 MG extended release tablet  Commonly known as: GLUCOPHAGE-XR     NovoLIN 70/30 FlexPen (70-30) 100 UNIT per ML injection pen  Generic drug: Insulin NPH Isophane & Regular     omeprazole 40 MG delayed release capsule  Commonly known as: PRILOSEC     ondansetron 4 MG disintegrating tablet  Commonly known as: ZOFRAN-ODT  Take 1 tablet by mouth 3 times daily as needed for Nausea or Vomiting     sucralfate 1 GM tablet  Commonly known as: CARAFATE               Where to Get Your Medications        These medications were sent to [facility] [phone] [place], [place] - [address] - P [phone] Carmon Ginsberg [phone]  [address] [place], [place] [address]      Phone: [phone]   metoprolol tartrate 25 MG tablet  DISPOSITION:    Home with Family:    x   Home with HH/PT/OT/RN:    SNF/LTC:    SA[facility]:    OTHER:            Code status: full code  Recommended diet: regular diet  Recommended activity: activity as tolerated  Wound care: None      Follow up with:   PCP : None, None  None, None    Follow up in 1 week(s)            Total time in minutes spent coordinating this discharge (includes going over instructions, follow-up, prescriptions, and preparing report for sign off to her PCP) :  35  minutes

## 2022-01-29 NOTE — Telephone Encounter (Signed)
Formatting of this note is different from the original.  Recent Office Visits       Date Provider Department Visit Type Primary Dx    03/09/2021 Payton Spark, MD [facility] Office Visit Other iron deficiency anemia    02/02/2021 Shroff, Shona Simpson, MD [facility] Office Visit Type 2 diabetes mellitus with diabetic neuropathy, with long-term current use of insulin (CMS-HCC)    07/14/2020 Shroff, Shona Simpson, MD [facility] Office Visit Type 2 diabetes mellitus with complications (CMS-HCC)         Last video visit and provider: Visit date not found     Last telephone visit and provider: Visit date not found     Future Appointments    This patient does not currently have any appointments scheduled.      Electronically signed by Buddy Duty, CMA at 01/29/2022  8:25 AM EST

## 2022-01-29 NOTE — Progress Notes (Signed)
Associated Order(s): EMG  Pre-Procedure Diagnose(s): Paresthesia  Post-Procedure Diagnose(s): Diabetic polyneuropathy associated with type 2 diabetes mellitus  Formatting of this note is different from the original.    Subjective:   Patient ID: Antonio Melendez is a 61 y.o. male.    Lower Back  The patient reports a chief complaint of constant bilateral foot tingling/numbness with radiation to the shins/calves.  The patient denies any issues with weakness.  The patient was referred by Dr. Caroleen Hamman, III.  The symptoms have been present for 3 years.  The onset was not associated with an injury.  Of note, the patient is diabetic (15 years). He does report some more recent lumbar and thigh pain associated with a worker's comp injury.        Objective:     Physical Exam   General appearance: Well-groomed and No Acute distress  Psych: Alert, Oriented, Pleasant and Cooperative  HEENT: Normocephalic/atraumatic and Non-icteric sclera  Cardiovascular Exam: Extremities warm and well perfused  RLE Edema: None  LLE Edema: None    Lumbar Exam    Skin: Normal  Lumbar Flexion: Mildly limited  Lumbar Extension: Mildly limited    Neurologic Exam   R Knee Extension: 5/5  L Knee Extension: 5/5  R Ankle Dorsiflexion: 5/5  L Ankle Dorsiflexion: 5/5  R Great Toe Extension: 5/5  L Great Toe Extension: 5/5  R Ankle Plantar Flexion: 5/5  L Ankle Plantar Flexion: 5/5    BLE Sensory: Grossly intact with the exception of  R Lateral Thigh: Normal  L Lateral Thigh: Normal  R Shin: Diminished to LT/PP  L Shin: Diminished to LT/PP  R Calf: Diminished to LT/PP  L Calf: Diminished to LT/PP  R Dorsal Foot: Diminished to LT/PP  L Dorsal Foot: Diminished to LT/PP  R Plantar Foot: Diminished to LT/PP  L Plantar Foot: Diminished to LT/PP  R Toes: Diminished to LT/PP  L Toes: Diminished to LT/PP  R Patellar Reflex: 0  L Patellar Reflex: 0  R Achilles Reflex: 0  L Achilles Reflex: 0    Gait Evaluation  Gait:  Toe raises: Both sides (with  support)  Heel-Standing: Both sides (with support)    Past Medical History:   Diagnosis Date   ? Anxiety    ? Diabetes mellitus    ? Hypertension      No Known Allergies    Current Outpatient Medications:   ?  amLODIPine (NORVASC) 10 MG tablet, Take 10 mg by mouth daily, Disp: , Rfl:   ?  ammonium lactate (LAC-HYDRIN) 12 % lotion, APPLY TO AFFECTED AREAS OF HEELS ON BOTH FEET ONCE DAILY., Disp: , Rfl:   ?  aspirin 81 MG EC tablet, Take 81 mg by mouth daily, Disp: , Rfl:   ?  busPIRone (BUSPAR) 5 MG tablet, Take 1 tablet by mouth 2 (two) times a day, Disp: , Rfl:   ?  folic acid (FOLVITE) 1 MG tablet, Take 1 mg by mouth daily, Disp: , Rfl:   ?  HumuLIN 70/30 KwikPen (70-30) 100 UNIT/ML suspension pen-injector, INJECT 48 UNITS SUBCUTANEOUSLY IN THE MORNING AND 28 IN THE EVENING, Disp: , Rfl:   ?  lisinopril (PRINIVIL,ZESTRIL) 20 MG tablet, , Disp: , Rfl:   ?  lisinopril (PRINIVIL,ZESTRIL) 40 MG tablet, Take 40 mg by mouth, Disp: , Rfl:   ?  metFORMIN XR (GLUCOPHATE-XR) 500 MG 24 hr tablet, every 12 hours, Disp: , Rfl:   ?  metoprolol succinate XL (TOPROL-XL) 100 MG 24  hr tablet, Take 100 mg by mouth daily, Disp: , Rfl:   ?  metoprolol tartrate (LOPRESSOR) 25 MG tablet, , Disp: , Rfl:   ?  omeprazole (PriLOSEC) 40 MG capsule, Take 1 capsule by mouth once daily, Disp: , Rfl:   ?  potassium chloride (K-DUR,KLOR-CON) 10 MEQ CR tablet, Take 10 mEq by mouth 2 times daily, Disp: , Rfl:   ?  potassium chloride (KLOR-CON) 10 MEQ CR tablet, , Disp: , Rfl:   ?  pregabalin (LYRICA) 50 MG capsule, TAKE 1 CAPSULE BY MOUTH 2 TIMES DAILY., Disp: , Rfl:   ?  rosuvastatin (CRESTOR) 40 MG tablet, daily, Disp: , Rfl:   ?  sildenafil (VIAGRA) 25 MG tablet, daily, Disp: , Rfl:     Review of Systems   01/29/2022    Constitutional: Unexplained: Negative  Genitourinary: Frequent Urination: Negative  HEENT: Vision Loss: Negative  Neurological: Memory Loss: Negative  Integumentary: Rash: Negative  Cardiovascular: Palpatations:  Negative  Hematologic: Bruises/Bleeds Easily: Negative  Gastrointestinal: Constipation: Negative  Immunological: Seasonal Allergies: Negative  Musculoskeletal: Joint Pain: Positive        Procedures:    EMG    Performed by: Shanda Howells, MD  Authorized by: Jonelle Sidle, DPM      Electromyography:     Site:  Right Leg and Left Leg    Number of right leg muscles studied:  5 or more    Number of left leg muscles studied:  5 or more  Nerve Conduction:     Nerves tested:  7-8  Findings/Interpretation:      Electrodiagnostic Findings  1) Nerve conduction studies of the bilateral lower extremities were significant for unobtainable sensory potentials and diminished/unobtainable motor potentials bilaterally.  2) Needle EMG of the bilateral lower extremities and lumbar paraspinals was normal.    Electrodiagnostic Impression  1) Sensorimotor axonal (diabetic) polyneuropathy.  2) No evidence of active lumbar radiculopathy. Note that spinal stenosis may be symptomatic despite the absence of EMG changes.    Radiographs:      No imaging obtained    Assessment:     1. Diabetic polyneuropathy associated with type 2 diabetes mellitus      Plan:     The problem was discussed at length with the patient.    We reviewed the results of the study in detail. The patient will follow up with Dr. Cathlean Cower to discuss further management.    The patient had opportunity today to ask all questions, expressed understanding of the instructions provided, and agreed with the plan.    By signing my name below, I, Tamela Oddi, attest that this documentation has been prepared under the direction and in the presence of Shanda Howells, MD.  Electronically Signed: Tamela Oddi  Electronically signed by Shanda Howells, MD at 01/29/2022  8:31 AM EST

## 2022-01-29 NOTE — Telephone Encounter (Signed)
Formatting of this note might be different from the original.  Please clarify with the patient specifically what dose of insulin he is currently taking including what his home blood sugar readings have been running.  Please see if we can get him in to see me either in person or via video visit.  Electronically signed by Payton Spark, MD at 01/29/2022 10:10 AM EST

## 2022-01-30 NOTE — Telephone Encounter (Signed)
Formatting of this note might be different from the original.  Spoke with pt and informed him of PCP response. Pt states that he takes Humulin 70/30 28 units in the morning and 38 units in the evening. His blood sugar ranges 140. He states that he does not need to refill his insulin at this moment.   Electronically signed by Webb Laws, RN at 01/30/2022  5:12 PM EST

## 2022-02-23 ENCOUNTER — Inpatient Hospital Stay
Admit: 2022-02-23 | Discharge: 2022-02-23 | Disposition: A | Discharge status: Home / Self Care | Payer: BLUE CROSS/BLUE SHIELD | Arrived: VH | Attending: Family Medicine

## 2022-02-23 ENCOUNTER — Emergency Department: Admit: 2022-02-23 | Payer: BLUE CROSS/BLUE SHIELD | Primary: Family Medicine

## 2022-02-23 DIAGNOSIS — J069 Acute upper respiratory infection, unspecified: Secondary | ICD-10-CM

## 2022-02-23 LAB — RAPID INFLUENZA A/B ANTIGENS
Influenza A Ag: NEGATIVE
Influenza B Ag: NEGATIVE

## 2022-02-23 MED ORDER — PREDNISONE 20 MG PO TABS
20 MG | ORAL_TABLET | Freq: Every day | ORAL | 0 refills | Status: AC
Start: 2022-02-23 — End: 2022-02-28

## 2022-02-23 MED ORDER — METHYLPREDNISOLONE SODIUM SUCC 125 MG IJ SOLR
125 MG | Freq: Once | INTRAMUSCULAR | Status: AC
Start: 2022-02-23 — End: 2022-02-23
  Administered 2022-02-23: 125 mg via INTRAMUSCULAR

## 2022-02-23 MED FILL — METHYLPREDNISOLONE SODIUM SUCC 125 MG IJ SOLR: 125 MG | INTRAMUSCULAR | Qty: 125

## 2022-02-23 NOTE — ED Provider Notes (Signed)
EMERGENCY DEPARTMENT HISTORY AND PHYSICAL EXAM      Date: 02/23/2022  Patient Name: Antonio JUSTO Sr.    History of Presenting Illness         HPI: Antonio MINKS Sr., is a very pleasant61 y.o. male presenting to the ED with a CC of cough, congestion, sweats  Recent onset of symptoms.  No fevers nor changes in oral intake.  No difficulty breathing.  No alleviating or aggravating factors.         Past History     Past Medical History:  Past Medical History:   Diagnosis Date    Depression     Diabetes (HCC)     ETOH abuse     High cholesterol     Hypertension        Allergies:  No Known Allergies    Review of Systems     Negative unless otherwise stated by HPI   Physical Exam     Vitals:    02/23/22 1740 02/23/22 1834   BP: 131/85    Pulse: (!) 112 96   Resp: 20    Temp: 98.5 F (36.9 C)    TempSrc: Oral    SpO2: 99%    Weight: 86.2 kg (190 lb)    Height: 1.753 m (5\' 9" )      CONSTITUTIONAL: Alert, in no distress. Appears stated age.,  Nontoxic, well-appearing  HEAD:  Normocephalic, atraumatic  EARS: Bilateral external auditory canals nonerythematous, bilateral tympanic membranes nonbulging and nonerythematous  EYES: EOM intact.  No conjunctival injection or scleral icterus  MOUTH: Posterior oropharynx nonerythematous, no swelling, uvula midline, tolerating secretions with ease  Neck:  Supple. No meningismus,  RESP: No increased work of breathing, lungs clear to auscultation bilaterally.  No wheezes rales nor rhonchi, coarse cough during exam  CV: Heart is regular rate and rhythm, no murmurs, no JVD, capillary refill less than 2 seconds  NEURO: Moves all extremities spontaneously.  No motor nor sensory deficits.  No focal neurologic deficits.  No neck stiffness nor pain with flexion and extension  PSYCH: Normal mood, normal affect      Medical Decision Making     RADIOLOGY:  Interpretation per the Radiologist below, if available at the time of this note:  XR CHEST PORTABLE   Final Result   No acute  cardiopulmonary disease.               Vital Signs-Reviewed the patient's vital signs.  Vitals:    02/23/22 1740 02/23/22 1834   BP: 131/85    Pulse: (!) 112 96   Resp: 20    Temp: 98.5 F (36.9 C)    TempSrc: Oral    SpO2: 99%    Weight: 86.2 kg (190 lb)    Height: 1.753 m (5\' 9" )        CONSULTS: (Who and What was discussed)  None      Chronic Conditions: Reviewed per above     Social Determinants affecting Dx or Tx: None     Records Reviewed (source and summary of external notes): Nursing notes    ED Course/medical decision making:   Initial assessment performed. The patients presenting problems have been discussed, and they are in agreement with the care plan formulated and outlined with them.  I have encouraged them to ask questions as they arise throughout their visit.      02/25/22 Sr. presented to the emergency department with the aforementioned chief complaint.  On  examination the patient is nontoxic and overall well-appearing.  No hypoxia.  Well-hydrated on exam.  Lungs are clear to auscultation bilaterally.  No increased work of breathing.  Findings consistent with likely self-limiting viral URI.  Patient was given quarantine/isolation recommendations and agrees with the plan to be discharged home.  They were provided instructions to return to the emergency department with any difficulty breathing, chest pain, altered mentation or any other new or worsening symptoms.  Patient was discharged home in stable condition.              Critical Care Time: None    Disposition:  DISCHARGE NOTE:  The pt is ready for discharge. The pt's signs, symptoms, diagnosis, and discharge instructions have been discussed and pt and or family have conveyed their understanding. The pt is to follow up as recommended or return to ER should their symptoms worsen. Plan has been discussed and pt is in agreement.     PLAN:  1.      Medication List        START taking these medications      predniSONE 20 MG tablet  Commonly  known as: DELTASONE  Take 2 tablets by mouth daily for 5 days            ASK your doctor about these medications      amLODIPine 10 MG tablet  Commonly known as: NORVASC     insulin regular 100 UNIT/ML injection  Commonly known as: HUMULIN R;NOVOLIN R     lisinopril 40 MG tablet  Commonly known as: PRINIVIL;ZESTRIL     metFORMIN 500 MG extended release tablet  Commonly known as: GLUCOPHAGE-XR     metoprolol tartrate 25 MG tablet  Commonly known as: LOPRESSOR  Take 1 tablet by mouth 2 times daily     NovoLIN 70/30 FlexPen (70-30) 100 UNIT per ML injection pen  Generic drug: Insulin NPH Isophane & Regular     omeprazole 40 MG delayed release capsule  Commonly known as: PRILOSEC     ondansetron 4 MG disintegrating tablet  Commonly known as: ZOFRAN-ODT  Take 1 tablet by mouth 3 times daily as needed for Nausea or Vomiting     sucralfate 1 GM tablet  Commonly known as: CARAFATE               Where to Get Your Medications        These medications were sent to [facility] - [place], [place] - [address] - P [phone] Carmon Ginsberg [phone]  [address] [place], [place] [address]      Phone: [phone]   predniSONE 20 MG tablet       2. Sharol Roussel, MD  [address] [place] [address]  [phone]          3. Take Tylenol as needed  4. Drink plenty of fluids  5. Return to ED if worse especially if any shortness of breath, chest pain or altered mentation.    Diagnosis     Clinical Impression:   1. URI with cough and congestion    2. Bronchitis        Please note that this dictation was completed with Dragon, the computer voice recognition software. Quite often unanticipated grammatical, syntax, homophones, and other interpretive errors are inadvertently transcribed by the computer software. Please disregards these errors. Please excuse any errors that have escaped final proofreading.        Cora Daniels,  DO  02/25/22 1451

## 2022-02-23 NOTE — ED Notes (Signed)
MD in room for assessment. HR reassessed. Apical rate 96 bpm.

## 2022-02-23 NOTE — ED Notes (Signed)
Pt placed in room 9. Spouse in chair in room with pt.

## 2022-02-23 NOTE — Discharge Instructions (Signed)
Thank you!  Thank you for allowing me to care for you in the emergency department. It is my goal to provide you with excellent care. If you have not received excellent quality care, please ask to speak to the nurse manager. Please fill out the survey that will come to you by mail or email since we listen to your feedback!     Below you will find a list of your tests from today's visit.  Should you have any questions, please do not hesitate to call the emergency department.    Labs  No results found for this or any previous visit (from the past 12 hour(s)).    Radiologic Studies  XR CHEST PORTABLE   Final Result   No acute cardiopulmonary disease.            ------------------------------------------------------------------------------------------------------------  The exam and treatment you received in the Emergency Department were for an urgent problem and are not intended as complete care. It is important that you follow-up with a doctor, nurse practitioner, or physician assistant to:  (1) confirm your diagnosis,  (2) re-evaluation of changes in your illness and treatment, and  (3) for ongoing care. Please take your discharge instructions with you when you go to your follow-up appointment.     If you have any problem arranging a follow-up appointment, contact the Emergency Department.  If your symptoms become worse or you do not improve as expected and you are unable to reach your health care provider, please return to the Emergency Department. We are available 24 hours a day.     If a prescription has been provided, please have it filled as soon as possible to prevent a delay in treatment. If you have any questions or reservations about taking the medication due to side effects or interactions with other medications, please call your primary care provider or contact the ER.

## 2022-02-23 NOTE — ED Triage Notes (Signed)
Pt reports cough, sweating, and SOB for 2 days.

## 2022-07-28 IMAGING — DX DG CHEST 2V
2 series · 2 of 2 positions shown · non-contrast
Comparison: None.

CLINICAL DATA: Chest pain

EXAM:
CHEST - 2 VIEW

[w chest pa]
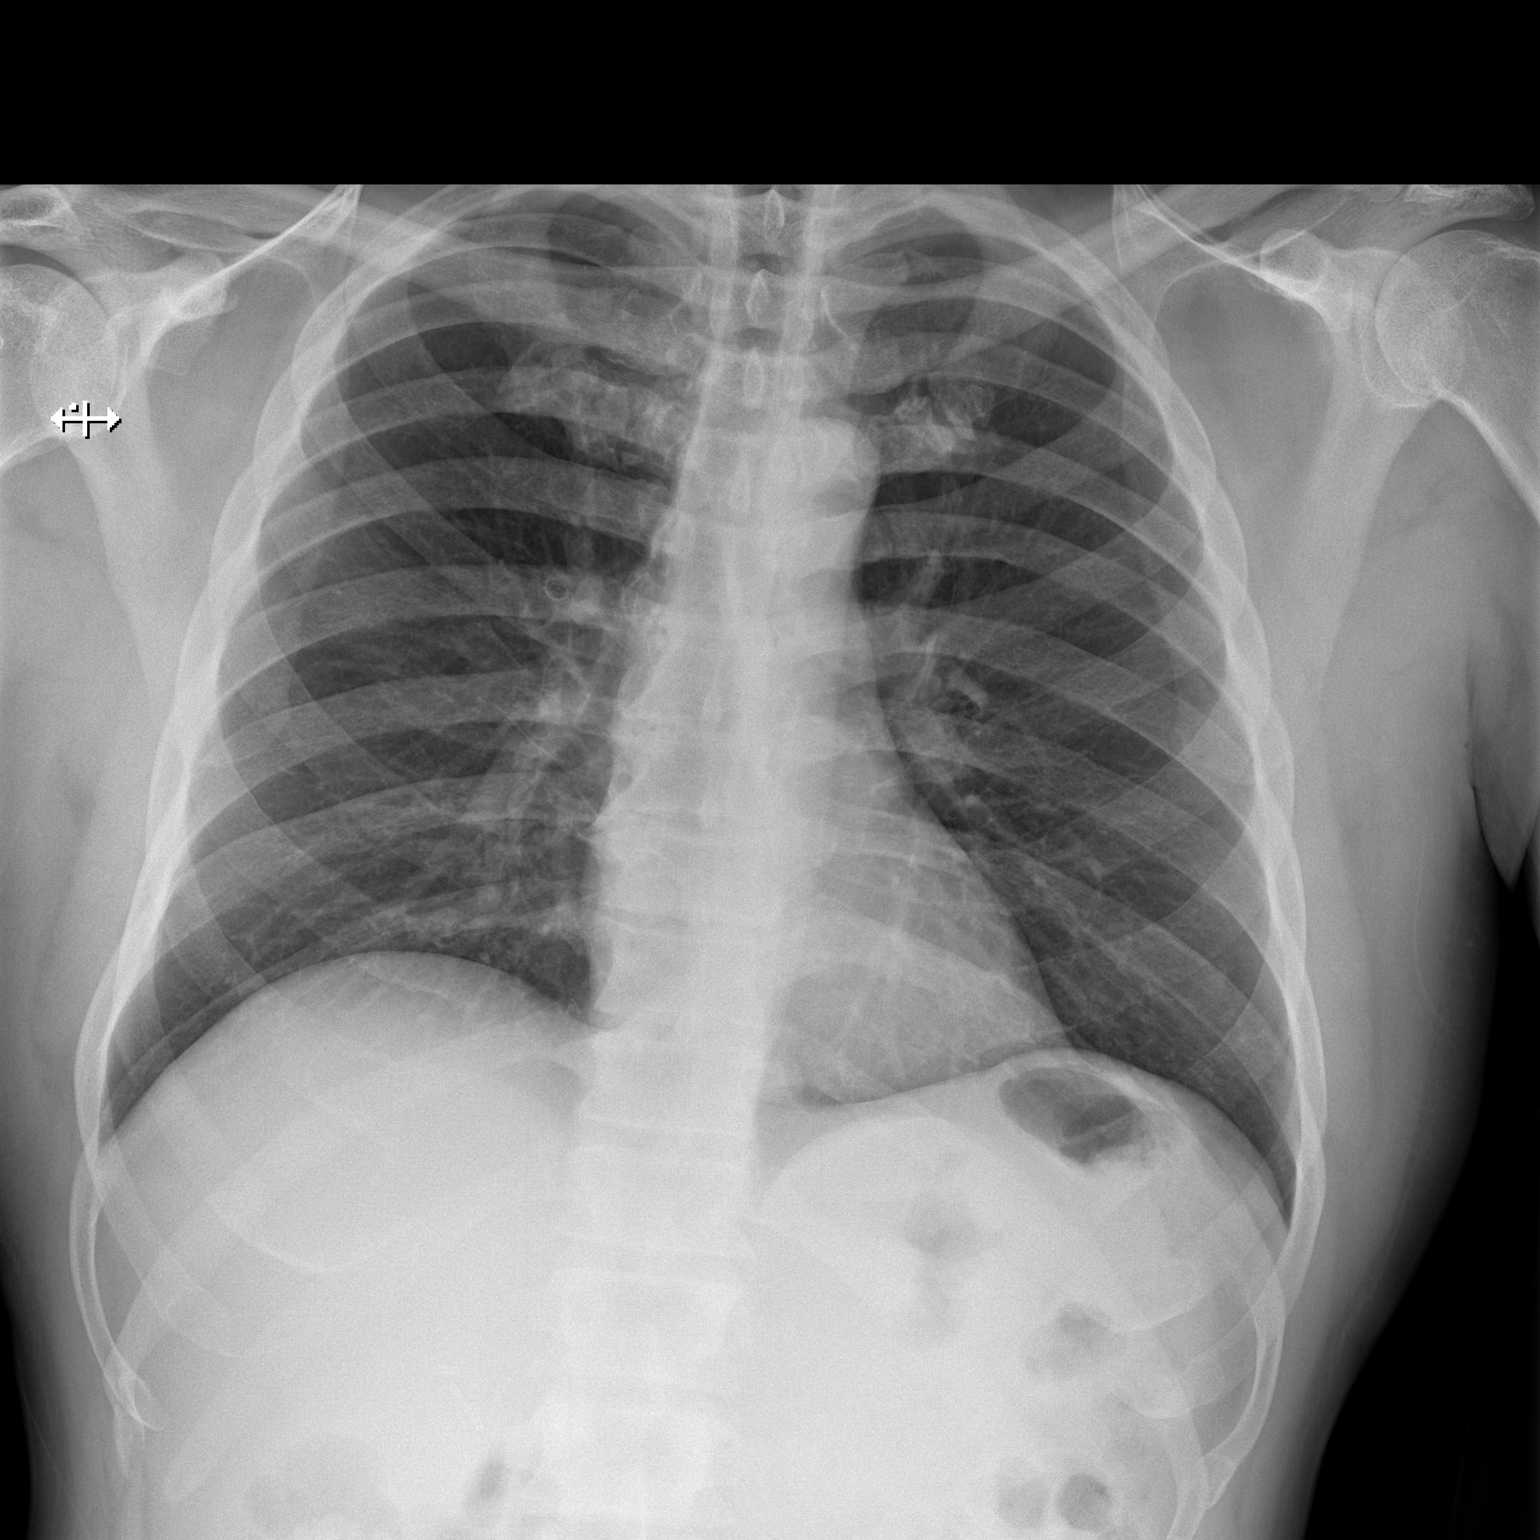

[w chest lat]
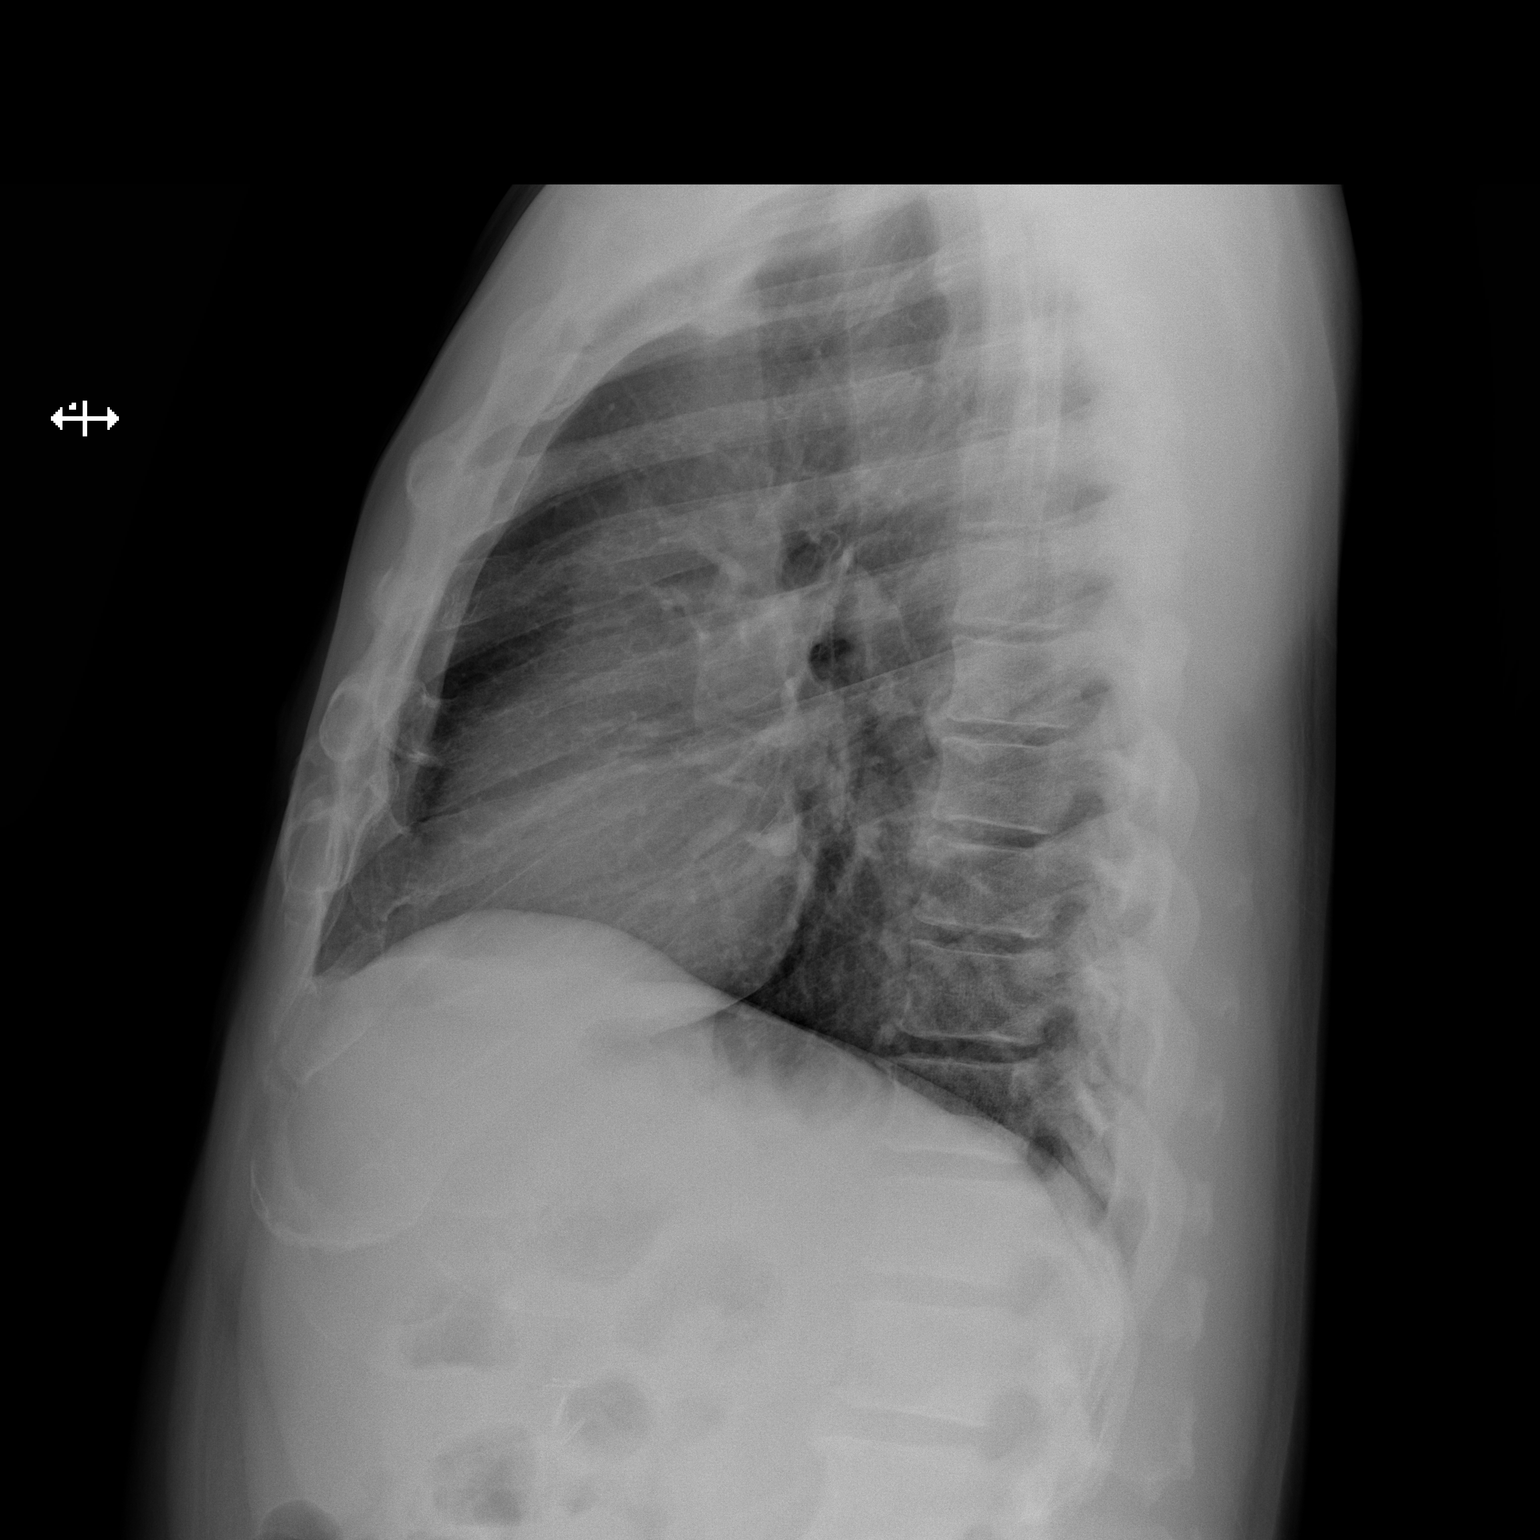

[2 of 2 positions shown; findings below may reference images not displayed]

FINDINGS: The heart size and mediastinal contours are within normal limits.
Both lungs are clear. The visualized skeletal structures are
unremarkable.
IMPRESSION: No active cardiopulmonary disease.
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
